# Patient Record
Sex: Female | Born: 1975 | Race: Black or African American | Hispanic: No | Marital: Married | Smoking: Never smoker
Health system: Southern US, Community
[De-identification: ages and names within clinical notes are randomized; demographics above are authoritative.]

## PROBLEM LIST (undated history)

## (undated) DIAGNOSIS — Z8719 Personal history of other diseases of the digestive system: Secondary | ICD-10-CM

## (undated) DIAGNOSIS — R002 Palpitations: Principal | ICD-10-CM

## (undated) DIAGNOSIS — K449 Diaphragmatic hernia without obstruction or gangrene: Secondary | ICD-10-CM

## (undated) DIAGNOSIS — K219 Gastro-esophageal reflux disease without esophagitis: Secondary | ICD-10-CM

## (undated) DIAGNOSIS — G8929 Other chronic pain: Secondary | ICD-10-CM

## (undated) DIAGNOSIS — F3342 Major depressive disorder, recurrent, in full remission: Secondary | ICD-10-CM

## (undated) DIAGNOSIS — E6609 Other obesity due to excess calories: Secondary | ICD-10-CM

## (undated) HISTORY — DX: Gastro-esophageal reflux disease without esophagitis: K21.9

## (undated) HISTORY — DX: Diaphragmatic hernia without obstruction or gangrene: K44.9

## (undated) HISTORY — DX: Palpitations: R00.2

---

## 2002-03-11 ENCOUNTER — Encounter: Payer: Self-pay | Admitting: Family Medicine

## 2002-03-11 ENCOUNTER — Ambulatory Visit (HOSPITAL_COMMUNITY): Admission: RE | Admit: 2002-03-11 | Discharge: 2002-03-11 | Payer: Self-pay | Admitting: Family Medicine

## 2004-11-22 ENCOUNTER — Emergency Department (HOSPITAL_COMMUNITY): Admission: EM | Admit: 2004-11-22 | Discharge: 2004-11-23 | Payer: Self-pay | Admitting: Emergency Medicine

## 2005-10-01 ENCOUNTER — Ambulatory Visit: Payer: Self-pay | Admitting: Gastroenterology

## 2005-10-14 ENCOUNTER — Ambulatory Visit: Payer: Self-pay | Admitting: Gastroenterology

## 2005-10-14 HISTORY — PX: ESOPHAGOGASTRODUODENOSCOPY: SHX1529

## 2007-04-02 LAB — CONVERTED CEMR LAB: Pap Smear: NORMAL

## 2007-04-28 ENCOUNTER — Ambulatory Visit: Payer: Self-pay | Admitting: Internal Medicine

## 2007-04-28 DIAGNOSIS — K219 Gastro-esophageal reflux disease without esophagitis: Secondary | ICD-10-CM | POA: Insufficient documentation

## 2007-04-30 ENCOUNTER — Ambulatory Visit: Payer: Self-pay | Admitting: Internal Medicine

## 2007-04-30 LAB — CONVERTED CEMR LAB
ALT: 14 units/L (ref 0–35)
AST: 18 units/L (ref 0–37)
Albumin: 3.9 g/dL (ref 3.5–5.2)
Alkaline Phosphatase: 60 units/L (ref 39–117)
BUN: 7 mg/dL (ref 6–23)
Basophils Absolute: 0.1 10*3/uL (ref 0.0–0.1)
Basophils Relative: 1.2 % — ABNORMAL HIGH (ref 0.0–1.0)
Bilirubin, Direct: 0.1 mg/dL (ref 0.0–0.3)
CO2: 29 meq/L (ref 19–32)
Calcium: 8.9 mg/dL (ref 8.4–10.5)
Chloride: 103 meq/L (ref 96–112)
Cholesterol: 157 mg/dL (ref 0–200)
Creatinine, Ser: 0.9 mg/dL (ref 0.4–1.2)
Eosinophils Absolute: 0.2 10*3/uL (ref 0.0–0.6)
Eosinophils Relative: 4 % (ref 0.0–5.0)
GFR calc Af Amer: 95 mL/min
GFR calc non Af Amer: 78 mL/min
Glucose, Bld: 101 mg/dL — ABNORMAL HIGH (ref 70–99)
HCT: 38.9 % (ref 36.0–46.0)
HDL: 48.2 mg/dL (ref >39.0)
Hemoglobin: 12.5 g/dL (ref 12.0–15.0)
LDL Cholesterol: 99 mg/dL (ref 0–99)
Lymphocytes Relative: 41 % (ref 12.0–46.0)
MCHC: 32 g/dL (ref 30.0–36.0)
MCV: 79.7 fL (ref 78.0–100.0)
Monocytes Absolute: 0.5 10*3/uL (ref 0.2–0.7)
Monocytes Relative: 8.6 % (ref 3.0–11.0)
Neutro Abs: 2.4 10*3/uL (ref 1.4–7.7)
Neutrophils Relative %: 45.2 % (ref 43.0–77.0)
Platelets: 249 10*3/uL (ref 150–400)
Potassium: 3.8 meq/L (ref 3.5–5.1)
RBC: 4.89 M/uL (ref 3.87–5.11)
RDW: 12.2 % (ref 11.5–14.6)
Sodium: 137 meq/L (ref 135–145)
TSH: 1.48 microintl units/mL (ref 0.35–5.50)
Total Bilirubin: 0.8 mg/dL (ref 0.3–1.2)
Total CHOL/HDL Ratio: 3.3
Total Protein: 6.9 g/dL (ref 6.0–8.3)
Triglycerides: 50 mg/dL (ref 0–149)
VLDL: 10 mg/dL (ref 0–40)
WBC: 5.5 10*3/uL (ref 4.5–10.5)

## 2008-08-30 LAB — CONVERTED CEMR LAB: Pap Smear: NORMAL

## 2009-02-17 ENCOUNTER — Ambulatory Visit: Payer: Self-pay | Admitting: Diagnostic Radiology

## 2009-02-17 ENCOUNTER — Ambulatory Visit: Payer: Self-pay | Admitting: Internal Medicine

## 2009-02-17 ENCOUNTER — Ambulatory Visit (HOSPITAL_BASED_OUTPATIENT_CLINIC_OR_DEPARTMENT_OTHER): Admission: RE | Admit: 2009-02-17 | Discharge: 2009-02-17 | Payer: Self-pay | Admitting: Internal Medicine

## 2009-02-17 ENCOUNTER — Encounter: Payer: Self-pay | Admitting: Family Medicine

## 2009-02-17 DIAGNOSIS — J22 Unspecified acute lower respiratory infection: Secondary | ICD-10-CM | POA: Insufficient documentation

## 2009-02-17 DIAGNOSIS — R051 Acute cough: Secondary | ICD-10-CM | POA: Insufficient documentation

## 2009-02-17 DIAGNOSIS — R05 Cough: Secondary | ICD-10-CM

## 2009-02-17 DIAGNOSIS — J069 Acute upper respiratory infection, unspecified: Secondary | ICD-10-CM | POA: Insufficient documentation

## 2010-03-06 NOTE — Letter (Signed)
Summary: Historic Patient File  Historic Patient File   Imported By: Rudene Anda 02/17/2009 09:18:33  _____________________________________________________________________  External Attachment:    Type:   Image     Comment:   External Document

## 2010-03-06 NOTE — Assessment & Plan Note (Signed)
Summary: Flu symptoms, fever102., - jr   Vital Signs:  Patient profile:   35 year old female Height:      64 inches Weight:      139 pounds BMI:     23.95 O2 Sat:      100 % on Room air Temp:     98.2 degrees F oral Pulse rate:   82 / minute Pulse rhythm:   regular Resp:     18 per minute BP sitting:   100 / 68  (right arm) Cuff size:   regular  Vitals Entered By: Glendell Docker CMA (February 17, 2009 10:23 AM)  O2 Flow:  Room air  Primary Care Provider:  D. Thomos Lemons DO  CC:  URI symptoms.  History of Present Illness:  URI Symptoms      This is a 35 year old woman who presents with URI symptoms.  The patient reports nasal congestion, purulent nasal discharge, dry cough, and productive cough, but denies sore throat, earache, and sick contacts.  Associated symptoms include fever of 100.5-103 degrees.  The patient denies dyspnea, wheezing, vomiting, and diarrhea.  The patient also reportsmuscle aches, and severe fatigue.    Preventive Screening-Counseling & Management  Alcohol-Tobacco     Smoking Status: never  Allergies: 1)  ! Amoxicillin  Past History:  Past Medical History: GERD EGD 10/14/2005 - Esophagitis   Family History: Mother age 41 -  hypertension Father age 94 - healthy She denies family history of colon cancer    Social History: Occupation: Psychologist, clinical for [employer] Married - 2 children Never Smoked Alcohol use-yes (seldom)    Physical Exam  General:  alert, well-developed, and well-nourished.   Ears:  R ear normal and L ear normal.   Mouth:  no exudates and pharyngeal erythema.   Neck:  supple and no masses.   Lungs:  normal respiratory effort, normal breath sounds, no crackles, and no wheezes.   Heart:  normal rate, regular rhythm, no murmur, and no gallop.   Abdomen:  soft and non-tender.   Extremities:  No lower extremity edema  Neurologic:  cranial nerves II-XII intact and gait normal.   Psych:  normally interactive,  good eye contact, not anxious appearing, and not depressed appearing.     Impression & Recommendations:  Problem # 1:  URI (ICD-465.9) 35 y/o fever, and URI symptoms.  we discussed possibility of influenza.  cxr negative.  symptomatic tx.  we discussed red flag symptoms.  Patient advised to call office if symptoms persist or worsen.  Other Orders: T-2 View CXR, Same Day (71020.5TC)  Patient Instructions: 1)  Drink plenty of fluids. 2)  Take over the counter tylenol or advil once or twice a day as needed for aches and pains.   Orders Added: 1)  T-2 View CXR, Same Day [71020.5TC] 2)  Est. Patient Level III [[address]]   Current Allergies (reviewed today): ! AMOXICILLIN     Preventive Care Screening  Pap Smear:    Date:  08/30/2008    Results:  normal

## 2010-03-06 NOTE — Letter (Signed)
Summary: Out of Work  Adult nurse at [employer]. [address]   [place], [place] [address]   Phone: [phone]  Fax: [phone]      February 17, 2009    Employee:  Terry Camacho    To Whom It May Concern:   For Medical reasons, please excuse the above named employee from work for the following dates:  Start:   02/17/2009  End:   02/21/2009    She may resume normal work schedule on 02/22/2009.If you need additional information, please feel free to contact our office.           Sincerely,    Glendell Docker CMA Dr Thomos Lemons

## 2010-06-06 ENCOUNTER — Encounter: Payer: Self-pay | Admitting: Internal Medicine

## 2010-06-07 ENCOUNTER — Ambulatory Visit (HOSPITAL_BASED_OUTPATIENT_CLINIC_OR_DEPARTMENT_OTHER)
Admission: RE | Admit: 2010-06-07 | Discharge: 2010-06-07 | Disposition: A | Discharge status: Home / Self Care | Payer: Managed Care, Other (non HMO) | Source: Ambulatory Visit | Attending: Internal Medicine | Admitting: Internal Medicine

## 2010-06-07 ENCOUNTER — Encounter: Payer: Self-pay | Admitting: Internal Medicine

## 2010-06-07 ENCOUNTER — Ambulatory Visit (INDEPENDENT_AMBULATORY_CARE_PROVIDER_SITE_OTHER): Payer: Managed Care, Other (non HMO) | Admitting: Internal Medicine

## 2010-06-07 DIAGNOSIS — R635 Abnormal weight gain: Secondary | ICD-10-CM

## 2010-06-07 DIAGNOSIS — R61 Generalized hyperhidrosis: Secondary | ICD-10-CM | POA: Insufficient documentation

## 2010-06-07 DIAGNOSIS — Z0289 Encounter for other administrative examinations: Secondary | ICD-10-CM

## 2010-06-07 LAB — CBC WITH DIFFERENTIAL/PLATELET
Basophils Relative: 1 % (ref 0–1)
Eosinophils Absolute: 0.1 10*3/uL (ref 0.0–0.7)
Eosinophils Relative: 2 % (ref 0–5)
Hemoglobin: 12.9 g/dL (ref 12.0–15.0)
Lymphs Abs: 2.5 10*3/uL (ref 0.7–4.0)
MCH: 25.9 pg — ABNORMAL LOW (ref 26.0–34.0)
MCHC: 32.6 g/dL (ref 30.0–36.0)
MCV: 79.5 fL (ref 78.0–100.0)
Monocytes Relative: 10 % (ref 3–12)
Neutrophils Relative %: 39 % — ABNORMAL LOW (ref 43–77)

## 2010-06-07 LAB — TSH: TSH: 0.952 u[IU]/mL (ref 0.350–4.500)

## 2010-06-07 NOTE — Patient Instructions (Signed)
Our office will contact you re:  Blood test and chest x ray results I suggest maintaining 1200-1300 cal diet.

## 2010-06-08 LAB — HEPATIC FUNCTION PANEL
ALT: 15 U/L (ref 0–35)
AST: 26 U/L (ref 0–37)
Albumin: 4.6 g/dL (ref 3.5–5.2)
Alkaline Phosphatase: 54 U/L (ref 39–117)
Total Bilirubin: 0.4 mg/dL (ref 0.3–1.2)
Total Protein: 7 g/dL (ref 6.0–8.3)

## 2010-06-22 NOTE — Letter (Signed)
October 01, 2005     Terry Camacho  [address]  [place], [place] [address]   MRN:  [id number]  /  DOB:  [birth year]   Dear Ms. [name];   It is my pleasure to have treated you recently as a new patient in my  office.  I appreciate your confidence and the opportunity to participate in  your care.   Since I do have a busy inpatient endoscopy schedule and office schedule, my  office hours vary weekly.  I am, however, available for emergency calls  every day through my office.  If I cannot promptly meet an urgent office  appointment, another one of our gastroenterologists will be able to assist  you.   My well-trained staff are prepared to help you at all times.  For  emergencies after office hours, a physician from our gastroenterology  section is always available through my 24-hour answering service.   While you are under my care, I encourage discussion of your questions and  concerns, and I will be happy to return your calls as soon as I am  available.   Once again, I welcome you as a new patient and I look forward to a happy and  healthy relationship.   Sincerely,     Barbette Hair. Arlyce Dice, MD, [facility]   RDK/MedQ  DD:  10/01/2005  DT:  10/02/2005  Job #:  [id number]

## 2010-06-22 NOTE — Assessment & Plan Note (Signed)
[facility]                           GASTROENTEROLOGY OFFICE NOTE   KYLEIGH, NANNINI                    MRN:          [id number]  DATE:10/01/2005                            DOB:          [birth year]    Ms. Camberos is a pleasant 35 year old African-American female, self-  referred for evaluation of pain.  She frequently develops postprandial  periumbilical and midepigastric pain, especially with spicy foods and fried  foods.  She has frequent pyrosis.  She has regurgitation of gastric contents  on occasion, and occasional hoarseness.  She took Prevacid on occasion with  only partial relief.  She is on gastric irritants including nonsteroidals.   PAST MEDICAL HISTORY:  Unremarkable.   FAMILY HISTORY:  Noncontributory.   MEDICATIONS:  She is currently on no medications.   ALLERGIES:  AMOXICILLIN.   SOCIAL HISTORY:  She does not smoke, she rarely drinks.  She is married, and  works in Clinical biochemist.   REVIEW OF SYSTEMS:  Was reviewed, and is negative.   PHYSICAL EXAMINATION:  VITAL SIGNS:  Pulse 80, blood pressure 120/78, weight  195.  HEENT: EOMI. PERRLA. Sclerae are anicteric.  Conjunctivae are pink.  NECK:  Supple without thyromegaly, adenopathy or carotid bruits.  CHEST:  Clear to auscultation and percussion without adventitious sounds.  CARDIAC:  Regular rhythm; normal S1 S2.  There are no murmurs, gallops or  rubs.  ABDOMEN:  Bowel sounds are normoactive.  Abdomen is soft, non-tender and non-  distended.  There are no abdominal masses, tenderness, splenic enlargement  or hepatomegaly.  EXTREMITIES:  Full range of motion.  No cyanosis, clubbing or edema.  RECTAL:  Deferred.   IMPRESSION:  1. Gastroesophageal reflux disease.  2. Abdominal pain.  This could be due to ulcer or nonulcerative dyspepsia.      Chronic cholecystitis is another concern.   RECOMMENDATION:  1. Begin Protonix 40 mg a day.  2. Upper endoscopy.  3.  If symptoms are not improved, and endoscopy is not diagnostic, I will      obtain an abdominal ultrasound.                                   Barbette Hair. Arlyce Dice, MD, [facility]   RDK/MedQ  DD:  10/01/2005  DT:  10/02/2005  Job #:  [id number]

## 2010-08-07 ENCOUNTER — Ambulatory Visit: Payer: Managed Care, Other (non HMO) | Admitting: Internal Medicine

## 2010-08-30 NOTE — Progress Notes (Signed)
  Subjective:    Patient ID: Terry Camacho, female    DOB: May 14, 1975, 35 y.o.   MRN: [id number]  HPI    Review of Systems     Objective:   Physical Exam        Assessment & Plan:

## 2010-10-15 ENCOUNTER — Encounter: Payer: Self-pay | Admitting: Gastroenterology

## 2010-10-18 ENCOUNTER — Ambulatory Visit (INDEPENDENT_AMBULATORY_CARE_PROVIDER_SITE_OTHER): Payer: Managed Care, Other (non HMO) | Admitting: Internal Medicine

## 2010-10-18 ENCOUNTER — Encounter: Payer: Self-pay | Admitting: Internal Medicine

## 2010-10-18 VITALS — BP 112/74 | HR 60 | Temp 98.2°F | Wt 141.0 lb

## 2010-10-18 DIAGNOSIS — B370 Candidal stomatitis: Secondary | ICD-10-CM

## 2010-10-18 DIAGNOSIS — R600 Localized edema: Secondary | ICD-10-CM

## 2010-10-18 DIAGNOSIS — R609 Edema, unspecified: Secondary | ICD-10-CM

## 2010-10-18 MED ORDER — FLUCONAZOLE 150 MG PO TABS: 150.0000 mg | ORAL_TABLET | Freq: Once | ORAL | Status: AC

## 2010-10-18 NOTE — Progress Notes (Signed)
  Subjective:    Patient ID: Terry Camacho, female    DOB: Feb 02, 1976, 35 y.o.   MRN: [id number]  HPI  35 y/o AA female complains of mild ankle edema over last 1-2 months.  Symptoms worse with prolonged standing.  No calf pain.   She follows fairly healthy diet but does eat sandwiches for lunch.  She works at [employer] and exercises regularly.  She drinks approx 1 gallon of water per day.  She denies hx of kidney disease.  No abnormal urination.  She also complains of white film on tongue since august.  She was treated with abx.  She has had recent screening for HIV - negative.  Review of Systems No chest pain or SOB  Past Medical History  Diagnosis Date  . GERD (gastroesophageal reflux disease)     History   Social History  . Marital Status: Married    Spouse Name: N/A    Number of Children: N/A  . Years of Education: N/A   Occupational History  . Not on file.   Social History Main Topics  . Smoking status: Never Smoker   . Smokeless tobacco: Not on file  . Alcohol Use: Not on file  . Drug Use: Not on file  . Sexually Active: Not on file   Other Topics Concern  . Not on file   Social History Narrative   Occupation: working at [employer] - 2 children (daugter 9 and 6)Never SmokedAlcohol use-yes (seldom)     Past Surgical History  Procedure Date  . Esophagogastroduodenoscopy 10/14/2005    esophagitis    Family History  Problem Relation Age of Onset  . Hypertension Mother 29  . Other Father 52    healty    Allergies  Allergen Reactions  . Amoxicillin     REACTION: Hives    No current outpatient prescriptions on file prior to visit.    BP 112/74  Pulse 60  Temp(Src) 98.2 F (36.8 C) (Oral)  Wt 141 lb (63.957 kg)       Objective:   Physical Exam   Constitutional: Appears well-developed and well-nourished. No distress.  HEENT:  White film on tongue Cardiovascular: Normal rate, regular rhythm and normal heart sounds.  Exam reveals no  gallop and no friction rub.   No murmur heard. Pulmonary/Chest: Effort normal and breath sounds normal.  No wheezes. No rales.  Abdominal: Soft. Bowel sounds are normal. No mass. There is no tenderness.  Lymph:  No cervical, axillary or groin adenopathy Ext:  Trace lower ext edema bilaterally.  No calf tenderness Skin: Skin is warm and dry.  Psychiatric: Normal mood and affect. Behavior is normal.     Assessment & Plan:

## 2010-10-18 NOTE — Assessment & Plan Note (Signed)
Oral thrush assoc with antibiotic use.  Use diflucan as directed.  Patient advised to call office if symptoms persist or worsen.

## 2010-10-18 NOTE — Patient Instructions (Signed)
Drink 6 to 8 glasses of water per day. Eating daily apple or serving of watermelon can act as natural diuretic Caffeine is also a natural diuretic Avoid foods high in salt Please call our office if your symptoms do not improve or gets worse.

## 2010-10-18 NOTE — Assessment & Plan Note (Signed)
Pt with mild lower ext edema with fluid retention.  Reassured pt.  Decrease fluid intake.  Eat foods that act as natural diuretic. Rule out proteinuria.  Check BMET

## 2010-10-19 LAB — BASIC METABOLIC PANEL
BUN: 18 mg/dL (ref 6–23)
Calcium: 8.9 mg/dL (ref 8.4–10.5)
GFR: 83.59 mL/min (ref >60.00)
Glucose, Bld: 93 mg/dL (ref 70–99)

## 2010-10-22 ENCOUNTER — Telehealth: Payer: Self-pay | Admitting: Internal Medicine

## 2010-10-22 MED ORDER — FLUCONAZOLE 150 MG PO TABS: 150.0000 mg | ORAL_TABLET | Freq: Once | ORAL | Status: AC

## 2010-10-22 NOTE — Telephone Encounter (Signed)
See office note.  rx was documented but I will send rx again.

## 2010-10-22 NOTE — Telephone Encounter (Signed)
Pt stated diflucan #7 pills  was not sent to [facility]. Pt seen on 9-14-`12

## 2010-10-22 NOTE — Telephone Encounter (Signed)
Pt aware.

## 2010-10-25 ENCOUNTER — Other Ambulatory Visit: Payer: Managed Care, Other (non HMO)

## 2010-11-08 ENCOUNTER — Ambulatory Visit (INDEPENDENT_AMBULATORY_CARE_PROVIDER_SITE_OTHER): Payer: Managed Care, Other (non HMO) | Admitting: Gastroenterology

## 2010-11-08 ENCOUNTER — Encounter: Payer: Self-pay | Admitting: Gastroenterology

## 2010-11-08 VITALS — BP 106/68 | HR 60 | Ht 62.5 in | Wt 144.6 lb

## 2010-11-08 DIAGNOSIS — R1013 Epigastric pain: Secondary | ICD-10-CM | POA: Insufficient documentation

## 2010-11-08 DIAGNOSIS — K3189 Other diseases of stomach and duodenum: Secondary | ICD-10-CM

## 2010-11-08 MED ORDER — OMEPRAZOLE 20 MG PO CPDR: 20.0000 mg | DELAYED_RELEASE_CAPSULE | ORAL | Status: DC

## 2010-11-08 NOTE — Progress Notes (Signed)
History of Present Illness:  Terry Camacho is a pleasant 35 year old Afro-American female referred at the request of Dr. Artist Pais for evaluation of abdominal discomfort. She complains of mild pain in the midepigastrium.   It occurs mainly in the mornings although can persist throughout the day. She denies pyrosis, per say. She had similar symptoms for which she was seen in 2007. Upper endoscopy demonstrated esophagitis. She's had no gastric irritants including nonsteroidals.    Review of Systems: Pertinent positive and negative review of systems were noted in the above HPI section. All other review of systems were otherwise negative.    Current Medications, Allergies, Past Medical History, Past Surgical History, Family History and Social History were reviewed in [facility] electronic medical record  Vital signs were reviewed in today's medical record. Physical Exam: General: Well developed , well nourished, no acute distress Head: Normocephalic and atraumatic Eyes:  sclerae anicteric, EOMI Ears: Normal auditory acuity Mouth: No deformity or lesions Lungs: Clear throughout to auscultation Heart: Regular rate and rhythm; no murmurs, rubs or bruits Abdomen: Soft,and non distended. No masses, hepatosplenomegaly or hernias noted. Normal Bowel sounds; there is minimal tenderness to palpation in the midepigastrium Rectal:deferred Musculoskeletal: Symmetrical with no gross deformities  Pulses:  Normal pulses noted Extremities: No clubbing, cyanosis, edema or deformities noted Neurological: Alert oriented x 4, grossly nonfocal Psychological:  Alert and cooperative. Normal mood and affect

## 2010-11-08 NOTE — Patient Instructions (Signed)
Please call our office back at [phone] if you are not feeling better in 2-3 weeks. CC: Dr Thomos Lemons

## 2010-11-08 NOTE — Assessment & Plan Note (Signed)
Symptoms are nonspecific and likely due to nonulcer dyspepsia. GERD may be contributing.  Medications #1 begin omeprazole 20 mg daily #2 if not improved in one to 2 weeks I would consider a trial of hyomax

## 2010-11-15 ENCOUNTER — Encounter: Payer: Self-pay | Admitting: Family Medicine

## 2010-11-15 ENCOUNTER — Ambulatory Visit (INDEPENDENT_AMBULATORY_CARE_PROVIDER_SITE_OTHER): Payer: Managed Care, Other (non HMO) | Admitting: Family Medicine

## 2010-11-15 VITALS — BP 90/60 | Temp 98.5°F | Wt 146.0 lb

## 2010-11-15 DIAGNOSIS — J209 Acute bronchitis, unspecified: Secondary | ICD-10-CM

## 2010-11-15 MED ORDER — HYDROCODONE-HOMATROPINE 5-1.5 MG/5ML PO SYRP: 5.0000 mL | ORAL_SOLUTION | Freq: Four times a day (QID) | ORAL | Status: AC | PRN

## 2010-11-15 NOTE — Patient Instructions (Signed)
Bronchitis Bronchitis is the body's way of reacting to injury and/or infection (inflammation) of the bronchi. Bronchi are the air tubes that extend from the windpipe into the lungs. If the inflammation becomes severe, it may cause shortness of breath.  CAUSES Inflammation may be caused by:  A virus.   Germs (bacteria).   Dust.   Allergens.   Pollutants and many other irritants.  The cells lining the bronchial tree are covered with tiny hairs (cilia). These constantly beat upward, away from the lungs, toward the mouth. This keeps the lungs free of pollutants. When these cells become too irritated and are unable to do their job, mucus begins to develop. This causes the characteristic cough of bronchitis. The cough clears the lungs when the cilia are unable to do their job. Without either of these protective mechanisms, the mucus would settle in the lungs. Then you would develop pneumonia. Smoking is a common cause of bronchitis and can contribute to pneumonia. Stopping this habit is the single most important thing you can do to help yourself. TREATMENT  Your caregiver may prescribe an antibiotic if the cough is caused by bacteria. Also, medicines that open up your airways make it easier to breathe. Your caregiver may also recommend or prescribe an expectorant. It will loosen the mucus to be coughed up. Only take over-the-counter or prescription medicines for pain, discomfort, or fever as directed by your caregiver.   Removing whatever causes the problem (smoking, for example) is critical to preventing the problem from getting worse.   Cough suppressants may be prescribed for relief of cough symptoms.   Inhaled medicines may be prescribed to help with symptoms now and to help prevent problems from returning.   For those with recurrent (chronic) bronchitis, there may be a need for steroid medicines.  SEEK IMMEDIATE MEDICAL CARE IF:  During treatment, you develop more pus-like mucus  (purulent sputum).   You or your child has an oral temperature above 102, not controlled by medicine.   Your baby is older than 3 months with a rectal temperature of 102 F (38.9 C) or higher.   Your baby is 3 months old or younger with a rectal temperature of 100.4 F (38 C) or higher.   You become progressively more ill.   You have increased difficulty breathing, wheezing, or shortness of breath.  It is necessary to seek immediate medical care if you are elderly or sick from any other disease. MAKE SURE YOU:  Understand these instructions.   Will watch your condition.   Will get help right away if you are not doing well or get worse.  Document Released: 01/21/2005 Document Re-Released: 04/17/2009 [employer] Patient Information 2011 [employer]. 

## 2010-11-15 NOTE — Progress Notes (Signed)
  Subjective:    Patient ID: Terry Camacho, female    DOB: 28-May-1975, 35 y.o.   MRN: [id number]  HPI Acute visit. 2 week history of nonproductive cough. Initially had some nasal congestion and cold-like symptoms. Cough is dry. No wheezing. No history of asthma. Nonsmoker. Patient denies fever. No cough or leaf with the Delsym cough syrup. Cough worse at night. No dyspnea or any pleuritic pain. No sick contacts.   Review of Systems  Constitutional: Negative for fever, chills, appetite change and unexpected weight change.  HENT: Negative for postnasal drip and sinus pressure.   Respiratory: Positive for cough. Negative for shortness of breath and wheezing.   Cardiovascular: Negative for chest pain.  Neurological: Negative for headaches.       Objective:   Physical Exam  Constitutional: She appears well-developed and well-nourished. No distress.  HENT:  Right Ear: External ear normal.  Left Ear: External ear normal.  Mouth/Throat: Oropharynx is clear and moist.  Neck: Neck supple.  Cardiovascular: Normal rate and regular rhythm.   Pulmonary/Chest: Effort normal and breath sounds normal. No respiratory distress. She has no wheezes. She has no rales.  Musculoskeletal: She exhibits no edema.  Lymphadenopathy:    She has no cervical adenopathy.          Assessment & Plan:  Cough probably related to acute viral bronchitis. Hycodan for nighttime suppression of cough. Followup as needed

## 2011-02-09 ENCOUNTER — Other Ambulatory Visit: Payer: Self-pay | Admitting: Gastroenterology

## 2011-02-25 ENCOUNTER — Ambulatory Visit (INDEPENDENT_AMBULATORY_CARE_PROVIDER_SITE_OTHER): Payer: Managed Care, Other (non HMO) | Admitting: Internal Medicine

## 2011-02-25 DIAGNOSIS — Z23 Encounter for immunization: Secondary | ICD-10-CM

## 2011-03-21 ENCOUNTER — Ambulatory Visit (INDEPENDENT_AMBULATORY_CARE_PROVIDER_SITE_OTHER): Payer: Managed Care, Other (non HMO) | Admitting: Family

## 2011-03-21 ENCOUNTER — Encounter: Payer: Self-pay | Admitting: Family

## 2011-03-21 DIAGNOSIS — J029 Acute pharyngitis, unspecified: Secondary | ICD-10-CM

## 2011-03-21 DIAGNOSIS — R0982 Postnasal drip: Secondary | ICD-10-CM

## 2011-03-21 MED ORDER — FEXOFENADINE-PSEUDOEPHED ER 180-240 MG PO TB24: 1.0000 | ORAL_TABLET | Freq: Every day | ORAL | Status: DC

## 2011-03-21 NOTE — Patient Instructions (Signed)

## 2011-03-21 NOTE — Progress Notes (Signed)
  Subjective:    Patient ID: Terry Camacho, female    DOB: 06/05/1975, 36 y.o.   MRN: [id number]  HPI A 36 year old [place] American female, nonsmoker, patient of Dr. Artist Pais in in with complaints of sore throat, drainage and hoarseness that occurs particularly at night x2 weeks. She has not taken any medication over-the-counter. She denies any fatigue, nausea, vomiting, muscle aches or pain.   Review of Systems  Constitutional: Negative.   HENT: Positive for sore throat and sinus pressure.   Respiratory: Negative.   Cardiovascular: Negative.   Musculoskeletal: Negative.   Skin: Negative.   Hematological: Negative.   Psychiatric/Behavioral: Negative.        Past Medical History  Diagnosis Date  . GERD (gastroesophageal reflux disease)   . Hiatal hernia     History   Social History  . Marital Status: Married    Spouse Name: N/A    Number of Children: 2  . Years of Education: N/A   Occupational History  . Customer Service    Social History Main Topics  . Smoking status: Never Smoker   . Smokeless tobacco: Never Used  . Alcohol Use: No     Rarely  . Drug Use: No  . Sexually Active: Not on file   Other Topics Concern  . Not on file   Social History Narrative   Occupation: working at [employer] - 2 children (daugter 9 and 6)Never SmokedAlcohol use-yes (seldom)     Past Surgical History  Procedure Date  . Esophagogastroduodenoscopy 10/14/2005    esophagitis    Family History  Problem Relation Age of Onset  . Hypertension Mother 45  . Other Father 77    healty  . Colon cancer Neg Hx   . Liver cancer Maternal Uncle   . Stomach cancer Maternal Aunt     Allergies  Allergen Reactions  . Amoxicillin     REACTION: Hives    Current Outpatient Prescriptions on File Prior to Visit  Medication Sig Dispense Refill  . omeprazole (PRILOSEC) 20 MG capsule TAKE 1 CAPSULE BY MOUTH DAILY  30 capsule  1    BP 120/80  Temp(Src) 98.6 F (37 C) (Oral)  Wt 148  lb (67.132 kg)charthe Objective:   Physical Exam  Constitutional: She is oriented to person, place, and time. She appears well-developed and well-nourished.  HENT:  Right Ear: External ear normal.  Left Ear: External ear normal.  Nose: Nose normal.  Mouth/Throat: Oropharynx is clear and moist.  Neck: Normal range of motion.  Cardiovascular: Normal rate, regular rhythm and normal heart sounds.   Pulmonary/Chest: Effort normal and breath sounds normal.  Neurological: She is alert and oriented to person, place, and time.  Skin: Skin is warm and dry.          Assessment & Plan:  Assessment: Pharyngitis, postnasal drip  Plan: Allegra-D 24-hour one by mouth daily. Call the office is symptoms worsen or persist. Check his schedule and when necessary.and

## 2011-08-20 ENCOUNTER — Ambulatory Visit (INDEPENDENT_AMBULATORY_CARE_PROVIDER_SITE_OTHER): Payer: Managed Care, Other (non HMO) | Admitting: Family

## 2011-08-20 ENCOUNTER — Encounter: Payer: Self-pay | Admitting: Family

## 2011-08-20 VITALS — BP 100/60 | Temp 99.0°F | Wt 148.0 lb

## 2011-08-20 DIAGNOSIS — K429 Umbilical hernia without obstruction or gangrene: Secondary | ICD-10-CM

## 2011-08-20 DIAGNOSIS — R52 Pain, unspecified: Secondary | ICD-10-CM

## 2011-08-20 DIAGNOSIS — R109 Unspecified abdominal pain: Secondary | ICD-10-CM

## 2011-08-20 DIAGNOSIS — K469 Unspecified abdominal hernia without obstruction or gangrene: Secondary | ICD-10-CM

## 2011-08-20 NOTE — Progress Notes (Signed)
  Subjective:    Patient ID: Terry Camacho, female    DOB: 10-19-1975, 36 y.o.   MRN: [id number]  HPI 36 year old [place] American female, nonsmoker, patient of Dr. Artist Pais is in today with complaints of abdominal pain over the past 5-6 months. She noticed the pain after lifting weights. She also reports a protrusion from her abdominal area. She is able to reduce it. The pain occurs about twice a week, lasting about an hour, rates it a 5/10. She has been continuing to weight lift. Reports lifting 80 pounds x12 repetitions, 3 sets daily.    Review of Systems  Constitutional: Negative.   Respiratory: Negative.   Cardiovascular: Negative.   Gastrointestinal: Positive for abdominal pain.       Bulge from the abdomen at the umbilicus.   Genitourinary: Negative.   Musculoskeletal: Negative.   Neurological: Negative.   Hematological: Negative.   Psychiatric/Behavioral: Negative.    Past Medical History  Diagnosis Date  . GERD (gastroesophageal reflux disease)   . Hiatal hernia     History   Social History  . Marital Status: Married    Spouse Name: N/A    Number of Children: 2  . Years of Education: N/A   Occupational History  . Customer Service    Social History Main Topics  . Smoking status: Never Smoker   . Smokeless tobacco: Never Used  . Alcohol Use: No     Rarely  . Drug Use: No  . Sexually Active: Not on file   Other Topics Concern  . Not on file   Social History Narrative   Occupation: working at [employer] - 2 children (daugter 9 and 6)Never SmokedAlcohol use-yes (seldom)     Past Surgical History  Procedure Date  . Esophagogastroduodenoscopy 10/14/2005    esophagitis    Family History  Problem Relation Age of Onset  . Hypertension Mother 15  . Other Father 59    healty  . Colon cancer Neg Hx   . Liver cancer Maternal Uncle   . Stomach cancer Maternal Aunt     Allergies  Allergen Reactions  . Amoxicillin     REACTION: Hives    Current  Outpatient Prescriptions on File Prior to Visit  Medication Sig Dispense Refill  . fexofenadine-pseudoephedrine (ALLEGRA-D 24) 180-240 MG per 24 hr tablet Take 1 tablet by mouth daily.  30 tablet  2  . omeprazole (PRILOSEC) 20 MG capsule TAKE 1 CAPSULE BY MOUTH DAILY  30 capsule  1    BP 100/60  Temp 99 F (37.2 C) (Oral)  Wt 148 lb (67.132 kg)chart    Objective:   Physical Exam  Constitutional: She is oriented to person, place, and time. She appears well-developed and well-nourished.  Neck: Normal range of motion. Neck supple.  Cardiovascular: Normal rate, regular rhythm and normal heart sounds.   Pulmonary/Chest: Effort normal and breath sounds normal.  Abdominal: Soft. Bowel sounds are normal.       Small protrusion from the abdomen at the site of the umbilicus that ir reducible. No strangulation noted.   Neurological: She is alert and oriented to person, place, and time.  Skin: Skin is warm and dry.  Psychiatric: She has a normal mood and affect.          Assessment & Plan:  Assessment: Hernia-umbilical, abdominal pain  Plan: Refer to surgeon. No weight lifting that could potentially put strain on the umbiblical area.

## 2011-08-20 NOTE — Patient Instructions (Addendum)
Hernia  A hernia occurs when an internal organ pushes out through a weak spot in the abdominal wall. Hernias most commonly occur in the groin and around the navel. Hernias often can be pushed back into place (reduced). Most hernias tend to get worse over time. Some abdominal hernias can get stuck in the opening (irreducible or incarcerated hernia) and cannot be reduced. An irreducible abdominal hernia which is tightly squeezed into the opening is at risk for impaired blood supply (strangulated hernia). A strangulated hernia is a medical emergency. Because of the risk for an irreducible or strangulated hernia, surgery may be recommended to repair a hernia.  CAUSES    Heavy lifting.   Prolonged coughing.   Straining to have a bowel movement.   A cut (incision) made during an abdominal surgery.  HOME CARE INSTRUCTIONS    Bed rest is not required. You may continue your normal activities.   Avoid lifting more than 10 pounds (4.5 kg) or straining.   Cough gently. If you are a smoker it is best to stop. Even the best hernia repair can break down with the continual strain of coughing. Even if you do not have your hernia repaired, a cough will continue to aggravate the problem.   Do not wear anything tight over your hernia. Do not try to keep it in with an outside bandage or truss. These can damage abdominal contents if they are trapped within the hernia sac.   Eat a normal diet.   Avoid constipation. Straining over long periods of time will increase hernia size and encourage breakdown of repairs. If you cannot do this with diet alone, stool softeners may be used.  SEEK IMMEDIATE MEDICAL CARE IF:    You have a fever.   You develop increasing abdominal pain.   You feel nauseous or vomit.   Your hernia is stuck outside the abdomen, looks discolored, feels hard, or is tender.   You have any changes in your bowel habits or in the hernia that are unusual for you.   You have increased pain or swelling around the  hernia.   You cannot push the hernia back in place by applying gentle pressure while lying down.  MAKE SURE YOU:    Understand these instructions.   Will watch your condition.   Will get help right away if you are not doing well or get worse.  Document Released: 01/21/2005 Document Revised: 01/10/2011 Document Reviewed: 09/10/2007  ExitCare Patient Information 2012 [employer].

## 2011-09-16 ENCOUNTER — Ambulatory Visit (INDEPENDENT_AMBULATORY_CARE_PROVIDER_SITE_OTHER): Payer: Managed Care, Other (non HMO) | Admitting: Surgery

## 2011-09-24 ENCOUNTER — Ambulatory Visit (INDEPENDENT_AMBULATORY_CARE_PROVIDER_SITE_OTHER): Payer: Managed Care, Other (non HMO) | Admitting: Surgery

## 2011-10-16 ENCOUNTER — Ambulatory Visit (INDEPENDENT_AMBULATORY_CARE_PROVIDER_SITE_OTHER): Payer: Managed Care, Other (non HMO) | Admitting: Surgery

## 2011-10-25 ENCOUNTER — Encounter (INDEPENDENT_AMBULATORY_CARE_PROVIDER_SITE_OTHER): Payer: Self-pay | Admitting: Surgery

## 2011-12-08 ENCOUNTER — Other Ambulatory Visit: Payer: Self-pay | Admitting: Gastroenterology

## 2011-12-10 ENCOUNTER — Other Ambulatory Visit (INDEPENDENT_AMBULATORY_CARE_PROVIDER_SITE_OTHER): Payer: Managed Care, Other (non HMO)

## 2011-12-10 DIAGNOSIS — Z0000 Encounter for general adult medical examination without abnormal findings: Secondary | ICD-10-CM

## 2011-12-10 LAB — CBC WITH DIFFERENTIAL/PLATELET
Basophils Absolute: 0 10*3/uL (ref 0.0–0.1)
Basophils Relative: 1.2 % (ref 0.0–3.0)
Eosinophils Absolute: 0.1 10*3/uL (ref 0.0–0.7)
MCHC: 32.2 g/dL (ref 30.0–36.0)
MCV: 82.5 fl (ref 78.0–100.0)
Monocytes Absolute: 0.4 10*3/uL (ref 0.1–1.0)
Neutro Abs: 1.5 10*3/uL (ref 1.4–7.7)
Neutrophils Relative %: 39.1 % — ABNORMAL LOW (ref 43.0–77.0)
RBC: 4.71 Mil/uL (ref 3.87–5.11)
RDW: 13.4 % (ref 11.5–14.6)

## 2011-12-10 LAB — BASIC METABOLIC PANEL
CO2: 24 mEq/L (ref 19–32)
Calcium: 8.2 mg/dL — ABNORMAL LOW (ref 8.4–10.5)
Chloride: 105 mEq/L (ref 96–112)
Creatinine, Ser: 1 mg/dL (ref 0.4–1.2)
Glucose, Bld: 87 mg/dL (ref 70–99)

## 2011-12-10 LAB — LIPID PANEL
Total CHOL/HDL Ratio: 2
Triglycerides: 40 mg/dL (ref 0.0–149.0)

## 2011-12-10 LAB — POCT URINALYSIS DIPSTICK
Bilirubin, UA: NEGATIVE
Glucose, UA: NEGATIVE
Ketones, UA: NEGATIVE
Leukocytes, UA: NEGATIVE
Nitrite, UA: NEGATIVE

## 2011-12-10 LAB — HEPATIC FUNCTION PANEL
AST: 23 U/L (ref 0–37)
Alkaline Phosphatase: 44 U/L (ref 39–117)
Bilirubin, Direct: 0.1 mg/dL (ref 0.0–0.3)

## 2011-12-17 ENCOUNTER — Encounter: Payer: Self-pay | Admitting: Internal Medicine

## 2011-12-17 ENCOUNTER — Ambulatory Visit (INDEPENDENT_AMBULATORY_CARE_PROVIDER_SITE_OTHER): Payer: Managed Care, Other (non HMO) | Admitting: Internal Medicine

## 2011-12-17 VITALS — BP 102/72 | HR 76 | Temp 98.3°F | Ht 62.0 in | Wt 153.0 lb

## 2011-12-17 DIAGNOSIS — Z23 Encounter for immunization: Secondary | ICD-10-CM

## 2011-12-17 DIAGNOSIS — Z0000 Encounter for general adult medical examination without abnormal findings: Secondary | ICD-10-CM | POA: Insufficient documentation

## 2011-12-17 DIAGNOSIS — K429 Umbilical hernia without obstruction or gangrene: Secondary | ICD-10-CM | POA: Insufficient documentation

## 2011-12-17 DIAGNOSIS — K219 Gastro-esophageal reflux disease without esophagitis: Secondary | ICD-10-CM

## 2011-12-17 NOTE — Patient Instructions (Addendum)
Take vitamin D3 2000 IU once daily Please return in 2 month for blood work: BMET, intact PTH, vitamin d level - hypocalcemia

## 2011-12-17 NOTE — Assessment & Plan Note (Addendum)
Patient has small umbilical hernia that is reducible. We discussed signs and symptoms of incarcerated hernia. She defers surgical referral for now.  Patient advised to use abdominal binder when she is lifting heavy objects.

## 2011-12-17 NOTE — Assessment & Plan Note (Signed)
Improved with omeprazole. Patient advised to transition to ranitidine 150 mg twice daily as needed.

## 2011-12-17 NOTE — Assessment & Plan Note (Addendum)
Reviewed adult health maintenance protocols.  Patient is up-to-date with adult vaccines. Patient advised to continue regular aerobic exercise. Screening lipid panel within acceptable limits.  PAP and pelvic performed by her GYN.

## 2011-12-17 NOTE — Progress Notes (Signed)
Subjective:    Patient ID: Terry Camacho, female    DOB: April 25, 1975, 36 y.o.   MRN: [id number]  HPI  36 year old [place] American female with history of GERD for routine physical. Interval medical history-patient was seen by nurse practitioner for umbilical hernia. Patient reports hernia is reducible. She has intermittent mild pains. She did not followup with surgeon as directed.  Screening labs were reviewed with patient in detail.  GERD-she is using omeprazole intermittently. Symptoms significantly improved since using PPI.  Review of Systems   Constitutional: Negative for activity change, appetite change. Mild weight gain Eyes: Negative for visual disturbance.  Respiratory: Negative for cough, chest tightness and shortness of breath.   Cardiovascular: Negative for chest pain.  Genitourinary: Negative for difficulty urinating.  Neurological: Negative for headaches.  Gastrointestinal: Negative for abdominal pain, heartburn melena or hematochezia Psych: Negative for depression or anxiety  Past Medical History  Diagnosis Date  . GERD (gastroesophageal reflux disease)   . Hiatal hernia     History   Social History  . Marital Status: Married    Spouse Name: N/A    Number of Children: 2  . Years of Education: N/A   Occupational History  . Customer Service    Social History Main Topics  . Smoking status: Never Smoker   . Smokeless tobacco: Never Used  . Alcohol Use: No     Comment: Rarely  . Drug Use: No  . Sexually Active: Not on file   Other Topics Concern  . Not on file   Social History Narrative   Occupation: working at [employer] - 2 children (daugter 9 and 6)Never SmokedAlcohol use-yes (seldom)     Past Surgical History  Procedure Date  . Esophagogastroduodenoscopy 10/14/2005    esophagitis    Family History  Problem Relation Age of Onset  . Hypertension Mother 65  . Other Father 47    healty  . Colon cancer Neg Hx   . Liver cancer Maternal  Uncle   . Stomach cancer Maternal Aunt     Allergies  Allergen Reactions  . Amoxicillin     REACTION: Hives    Current Outpatient Prescriptions on File Prior to Visit  Medication Sig Dispense Refill  . omeprazole (PRILOSEC) 20 MG capsule TAKE 1 CAPSULE BY MOUTH DAILY  30 capsule  1    BP 102/72  Pulse 76  Temp 98.3 F (36.8 C) (Oral)  Ht 5\' 2"  (1.575 m)  Wt 153 lb (69.4 kg)  BMI 27.98 kg/m2        Objective:   Physical Exam  Constitutional: She is oriented to person, place, and time. She appears well-developed and well-nourished.  HENT:  Head: Normocephalic and atraumatic.  Right Ear: External ear normal.  Left Ear: External ear normal.  Mouth/Throat: Oropharynx is clear and moist.  Eyes: EOM are normal. Pupils are equal, round, and reactive to light.  Neck: Neck supple.  Cardiovascular: Normal rate and regular rhythm.   Pulmonary/Chest: Effort normal and breath sounds normal. She has no wheezes.  Abdominal: Soft. Bowel sounds are normal. She exhibits no mass.       Small umbilical hernia, reducible  Musculoskeletal:       Trace lower extremity edema  Lymphadenopathy:    She has no cervical adenopathy.  Neurological: She is alert and oriented to person, place, and time. No cranial nerve deficit.  Skin: Skin is warm and dry.  Psychiatric: She has a normal mood and affect. Her behavior is normal.  Assessment & Plan:

## 2012-02-10 ENCOUNTER — Telehealth: Payer: Self-pay | Admitting: Internal Medicine

## 2012-02-10 NOTE — Telephone Encounter (Signed)
Patient Information:  Caller Name: Shanise  Phone: [phone]  Patient: Dlisa, Barnwell  Gender: Female  DOB: December 22, 1975  Age: 37 Years  PCP: Artist Pais Doe-Hyun Molly Maduro) (Adults only)  Pregnant: No  Office Follow Up:  Does the office need to follow up with this patient?: Yes  Instructions For The Office: Yes please call to schedule appt. for within 72 hours.  RN Note:  Needs to be seen in 72 hours.  Please call to schedule. Denies any exposure to fumes or gases prior to the hoarseness.  Started occuring in August 2013 x1, and then monthly and sometimes several times a month.  Will just wake up with the hoarseness and it will last for about 7 days and then go away.  No sore throat or pain or fever.  Symptoms  Reason For Call & Symptoms: Having repeated episodes of hoarseness with no other sx.  Reviewed Health History In EMR: Yes  Reviewed Medications In EMR: Yes  Reviewed Allergies In EMR: Yes  Reviewed Surgeries / Procedures: Yes  Date of Onset of Symptoms: 02/07/2012 OB / GYN:  LMP: Unknown  Guideline(s) Used:  No Protocol Available - Sick Adult  Disposition Per Guideline:   See Within 3 Days in Office  Reason For Disposition Reached:   Nursing judgment  Advice Given:  N/A

## 2012-02-10 NOTE — Telephone Encounter (Signed)
Left message for pt to call back to schedule appt

## 2012-04-08 ENCOUNTER — Ambulatory Visit (INDEPENDENT_AMBULATORY_CARE_PROVIDER_SITE_OTHER): Payer: Managed Care, Other (non HMO) | Admitting: Internal Medicine

## 2012-04-08 ENCOUNTER — Encounter: Payer: Self-pay | Admitting: Internal Medicine

## 2012-04-08 VITALS — BP 96/70 | HR 64 | Temp 98.2°F | Wt 154.0 lb

## 2012-04-08 DIAGNOSIS — IMO0001 Reserved for inherently not codable concepts without codable children: Secondary | ICD-10-CM

## 2012-04-08 MED ORDER — METHOCARBAMOL 500 MG PO TABS: 500.0000 mg | ORAL_TABLET | Freq: Two times a day (BID) | ORAL | Status: DC | PRN

## 2012-04-08 NOTE — Assessment & Plan Note (Signed)
37 year old African American female complains of post exertional muscle aches predominantly in her lower extremities.  Rule out polymyositis. Obtain CPK, aldolase, ANA, sedimentation rate.  Trial of muscle relaxers. If she has evidence of myositis, we discussed referral to rheumatology.

## 2012-04-08 NOTE — Progress Notes (Signed)
  Subjective:    Patient ID: Terry Camacho, female    DOB: 1975-03-29, 37 y.o.   MRN: [id number]  HPI  37 year old African American female complains of mild lower extremity edema. Her symptoms seem to be worse after prolonged standing. Patient also complains of post exertional muscle aches. She exercises on a fairly regular basis. After performing lower extremity exercises, she experiences muscle aches and pains for 2-3 days. She also has mild post exertional weakness.  Review of Systems Negative for rash  Past Medical History  Diagnosis Date  . GERD (gastroesophageal reflux disease)   . Hiatal hernia     History   Social History  . Marital Status: Married    Spouse Name: N/A    Number of Children: 2  . Years of Education: N/A   Occupational History  . Customer Service    Social History Main Topics  . Smoking status: Never Smoker   . Smokeless tobacco: Never Used  . Alcohol Use: No     Comment: Rarely  . Drug Use: No  . Sexually Active: Not on file   Other Topics Concern  . Not on file   Social History Narrative   Occupation: working at [employer]   Married - 2 children (daugter 9 and 6)   Never Smoked   Alcohol use-yes (seldom)        Past Surgical History  Procedure Laterality Date  . Esophagogastroduodenoscopy  10/14/2005    esophagitis    Family History  Problem Relation Age of Onset  . Hypertension Mother 2  . Other Father 60    healty  . Colon cancer Neg Hx   . Liver cancer Maternal Uncle   . Stomach cancer Maternal Aunt     Allergies  Allergen Reactions  . Amoxicillin     REACTION: Hives    No current outpatient prescriptions on file prior to visit.   No current facility-administered medications on file prior to visit.    BP 96/70  Pulse 64  Temp(Src) 98.2 F (36.8 C) (Oral)  Wt 154 lb (69.854 kg)  BMI 28.16 kg/m2       Objective:   Physical Exam  Constitutional: She appears well-developed and well-nourished.   Cardiovascular: Normal rate, regular rhythm, normal heart sounds and intact distal pulses.   Pulmonary/Chest: Effort normal and breath sounds normal. She has no wheezes.  Musculoskeletal: She exhibits no tenderness.  Trace lower extremity edema bilaterally          Assessment & Plan:

## 2012-05-11 ENCOUNTER — Encounter: Payer: Self-pay | Admitting: Internal Medicine

## 2012-05-11 ENCOUNTER — Ambulatory Visit: Payer: Managed Care, Other (non HMO)

## 2012-05-11 ENCOUNTER — Ambulatory Visit (INDEPENDENT_AMBULATORY_CARE_PROVIDER_SITE_OTHER): Payer: Managed Care, Other (non HMO) | Admitting: Internal Medicine

## 2012-05-11 VITALS — BP 102/74 | HR 64 | Temp 98.0°F | Wt 158.0 lb

## 2012-05-11 DIAGNOSIS — IMO0001 Reserved for inherently not codable concepts without codable children: Secondary | ICD-10-CM

## 2012-05-11 LAB — CBC WITH DIFFERENTIAL/PLATELET
Basophils Absolute: 0.1 10*3/uL (ref 0.0–0.1)
Eosinophils Absolute: 0.1 10*3/uL (ref 0.0–0.7)
Lymphocytes Relative: 34.5 % (ref 12.0–46.0)
MCHC: 33 g/dL (ref 30.0–36.0)
MCV: 80.3 fl (ref 78.0–100.0)
Monocytes Absolute: 0.5 10*3/uL (ref 0.1–1.0)
Neutro Abs: 3.1 10*3/uL (ref 1.4–7.7)
Neutrophils Relative %: 53.8 % (ref 43.0–77.0)
RDW: 13 % (ref 11.5–14.6)

## 2012-05-11 LAB — BASIC METABOLIC PANEL
CO2: 25 mEq/L (ref 19–32)
Calcium: 8.7 mg/dL (ref 8.4–10.5)
Chloride: 107 mEq/L (ref 96–112)
Creatinine, Ser: 1.2 mg/dL (ref 0.4–1.2)
Glucose, Bld: 92 mg/dL (ref 70–99)

## 2012-05-11 LAB — T4, FREE: Free T4: 0.8 ng/dL (ref 0.60–1.60)

## 2012-05-11 MED ORDER — METHOCARBAMOL 500 MG PO TABS: 500.0000 mg | ORAL_TABLET | Freq: Two times a day (BID) | ORAL | Status: DC | PRN

## 2012-05-11 NOTE — Patient Instructions (Addendum)
Our office will contact you re: blood test results 

## 2012-05-11 NOTE — Progress Notes (Signed)
  Subjective:    Patient ID: Terry Camacho, female    DOB: 06-26-1975, 37 y.o.   MRN: [id number]  HPI  37 year old African American female previously seen for post exertional muscle aches and weakness for followup. Overall her symptoms have now been going on for 3-4 months. Her symptoms did not improve with rest. She restarted her exercise program and is experiencing recurrence of muscle burning sensation and weakness. It takes an usually long time for her muscles to recover.  Prevous she experienced leg weakness and pain.  She reports similar symptoms after upper extremity work outs.  Patient denies any preceding flulike illness before onset of symptoms.  Patient did not have blood work as directed. She has not tried Robaxin.  Review of Systems See HPI  Past Medical History  Diagnosis Date  . GERD (gastroesophageal reflux disease)   . Hiatal hernia     History   Social History  . Marital Status: Married    Spouse Name: N/A    Number of Children: 2  . Years of Education: N/A   Occupational History  . Customer Service    Social History Main Topics  . Smoking status: Never Smoker   . Smokeless tobacco: Never Used  . Alcohol Use: No     Comment: Rarely  . Drug Use: No  . Sexually Active: Not on file   Other Topics Concern  . Not on file   Social History Narrative   Occupation: working at [employer]   Married - 2 children (daugter 9 and 6)   Never Smoked   Alcohol use-yes (seldom)        Past Surgical History  Procedure Laterality Date  . Esophagogastroduodenoscopy  10/14/2005    esophagitis    Family History  Problem Relation Age of Onset  . Hypertension Mother 4  . Other Father 8    healty  . Colon cancer Neg Hx   . Liver cancer Maternal Uncle   . Stomach cancer Maternal Aunt     Allergies  Allergen Reactions  . Amoxicillin     REACTION: Hives    No current outpatient prescriptions on file prior to visit.   No current facility-administered  medications on file prior to visit.    BP 102/74  Pulse 64  Temp(Src) 98 F (36.7 C) (Oral)  Wt 158 lb (71.668 kg)  BMI 28.89 kg/m2       Objective:   Physical Exam  Constitutional: She appears well-developed and well-nourished.  Cardiovascular: Regular rhythm and normal heart sounds.   No murmur heard. Pulmonary/Chest: Effort normal and breath sounds normal. She has no wheezes.  Musculoskeletal:  Muscle strength is 5 out of 5 in upper and lower extremities  Neurological: She displays normal reflexes.          Assessment & Plan:

## 2012-05-11 NOTE — Assessment & Plan Note (Signed)
No improvement in post exertional muscle pain and weakness after exercise. Her symptoms ongoing for 3-4 months. Proceed with blood work.  Consider rheumatology referral.

## 2012-05-12 ENCOUNTER — Other Ambulatory Visit: Payer: Self-pay | Admitting: Internal Medicine

## 2012-05-12 DIAGNOSIS — R748 Abnormal levels of other serum enzymes: Secondary | ICD-10-CM

## 2012-05-14 LAB — ALDOLASE: Aldolase: 33.7 U/L — ABNORMAL HIGH (ref <=8.1)

## 2012-06-02 ENCOUNTER — Ambulatory Visit (INDEPENDENT_AMBULATORY_CARE_PROVIDER_SITE_OTHER): Payer: Managed Care, Other (non HMO) | Admitting: Neurology

## 2012-06-02 ENCOUNTER — Ambulatory Visit (INDEPENDENT_AMBULATORY_CARE_PROVIDER_SITE_OTHER): Payer: Managed Care, Other (non HMO)

## 2012-06-02 DIAGNOSIS — M6281 Muscle weakness (generalized): Secondary | ICD-10-CM

## 2012-06-02 DIAGNOSIS — R29898 Other symptoms and signs involving the musculoskeletal system: Secondary | ICD-10-CM

## 2012-06-02 DIAGNOSIS — Z0289 Encounter for other administrative examinations: Secondary | ICD-10-CM

## 2012-06-02 NOTE — Procedures (Signed)
  HISTORY:  Terry Camacho is a 37 year old patient with a one-year history of intermittent episodes of muscle pain and slight muscle swelling that occur after periods of exercise. The symptoms have significantly worsened over the last several months. The patient has had documented episodes of CK enzyme elevations over 2000. The patient is being evaluated for a possible myopathy. The patient denies any weakness of the extremities, and she denies any exercise intolerance or pain in the muscles during exercise. The patient denies any muscle cramps.  NERVE CONDUCTION STUDIES:  Nerve conduction studies were performed on both lower extremities. The distal motor latencies and motor amplitudes for the peroneal and posterior tibial nerves were within normal limits. The nerve conduction velocities for these nerves were also normal. The H reflex latencies normal. The sensory latencies for the peroneal nerves were within normal limits.    EMG STUDIES:  EMG study was performed on the right lower extremity:  The tibialis anterior muscle reveals 1 to 3K motor units with full recruitment. No fibrillations or positive waves were seen. The peroneus tertius muscle reveals 1 to 3K motor units with full recruitment. No fibrillations or positive waves were seen. The medial gastrocnemius muscle reveals 1 to 2K motor units with full recruitment. No fibrillations or positive waves were seen. The vastus lateralis muscle reveals 2 to 4K motor units with full recruitment. No fibrillations or positive waves were seen. The iliopsoas muscle reveals 2 to 4K motor units with full recruitment. No fibrillations or positive waves were seen. The biceps femoris muscle (long head) reveals 2 to 4K motor units with full recruitment. No fibrillations or positive waves were seen. The lumbosacral paraspinal muscles were tested at 3 levels, and revealed no abnormalities of insertional activity at all 3 levels tested. There was good  relaxation.  EMG study was performed on the left lower extremity:  The tibialis anterior muscle reveals 1 to 3K motor units with full recruitment. No fibrillations or positive waves were seen. The peroneus tertius muscle reveals 1 to 3K motor units with full recruitment. No fibrillations or positive waves were seen. The medial gastrocnemius muscle reveals 1 to 3K motor units with full recruitment. No fibrillations or positive waves were seen. The vastus lateralis muscle reveals 2 to 4K motor units with full recruitment. No fibrillations or positive waves were seen. The iliopsoas muscle reveals 2 to 4K motor units with full recruitment. No fibrillations or positive waves were seen. The biceps femoris muscle (long head) reveals 2 to 4K motor units with full recruitment. No fibrillations or positive waves were seen. The lumbosacral paraspinal muscles were tested at 3 levels, and revealed no abnormalities of insertional activity at all 3 levels tested. There was good relaxation.    IMPRESSION:  Nerve conduction studies done on both lower extremities were unremarkable, without evidence of a peripheral neuropathy. EMG evaluation of both lower extremities were normal, without evidence of myopathic motor units. This study would not excluded various etiologies of rhabdomyolysis. There is no evidence of an inflammatory myositis on this evaluation.  Marlan Palau MD 06/02/2012 2:17 PM  [facility] [address] [place], [place] [address]  Phone [phone] Fax [phone]

## 2012-08-25 ENCOUNTER — Ambulatory Visit (INDEPENDENT_AMBULATORY_CARE_PROVIDER_SITE_OTHER): Payer: Managed Care, Other (non HMO) | Admitting: Family Medicine

## 2012-08-25 ENCOUNTER — Encounter: Payer: Self-pay | Admitting: Family Medicine

## 2012-08-25 VITALS — BP 110/72 | Temp 98.3°F | Wt 157.0 lb

## 2012-08-25 DIAGNOSIS — H6120 Impacted cerumen, unspecified ear: Secondary | ICD-10-CM

## 2012-08-25 DIAGNOSIS — J309 Allergic rhinitis, unspecified: Secondary | ICD-10-CM

## 2012-08-25 DIAGNOSIS — H6121 Impacted cerumen, right ear: Secondary | ICD-10-CM

## 2012-08-25 MED ORDER — FLUTICASONE PROPIONATE 50 MCG/ACT NA SUSP: 2.0000 | Freq: Every day | NASAL | Status: DC

## 2012-08-25 NOTE — Progress Notes (Signed)
Chief Complaint  Patient presents with  . Otalgia    hearing muffled x 1 month; pain x 1 week     HPI:  Acute visit for:  1) R Ear Pain: -started: 1 week ago, pain is mildo -symptoms: pain in R ear, noticed some popping/muffled sensation about 1 month ago that resolved when moved her ear around, nasal congestion, sneezing, sinus pressure, feels like has a cold, itchy throat at times -denies: fever, NVD, tooth pain, hearing loss -uses q-tips in ears to try to clean out   ROS: See pertinent positives and negatives per HPI.  Past Medical History  Diagnosis Date  . GERD (gastroesophageal reflux disease)   . Hiatal hernia     Family History  Problem Relation Age of Onset  . Hypertension Mother 40  . Other Father 80    healty  . Colon cancer Neg Hx   . Liver cancer Maternal Uncle   . Stomach cancer Maternal Aunt     History   Social History  . Marital Status: Married    Spouse Name: N/A    Number of Children: 2  . Years of Education: N/A   Occupational History  . Customer Service    Social History Main Topics  . Smoking status: Never Smoker   . Smokeless tobacco: Never Used  . Alcohol Use: No     Comment: Rarely  . Drug Use: No  . Sexually Active: None   Other Topics Concern  . None   Social History Narrative   Occupation: working at [employer]   Married - 2 children (daugter 9 and 6)   Never Smoked   Alcohol use-yes (seldom)        Current outpatient prescriptions:fluticasone (FLONASE) 50 MCG/ACT nasal spray, Place 2 sprays into the nose daily., Disp: 16 g, Rfl: 2;  methocarbamol (ROBAXIN) 500 MG tablet, Take 1 tablet (500 mg total) by mouth 2 (two) times daily as needed., Disp: 30 tablet, Rfl: 1  EXAM:  Filed Vitals:   08/25/12 0759  BP: 110/72  Temp: 98.3 F (36.8 C)    Body mass index is 28.71 kg/(m^2).  GENERAL: vitals reviewed and listed above, alert, oriented, appears well hydrated and in no acute distress  HEENT: atraumatic, conjunttiva  clear, no obvious abnormalities on inspection of external nose and ears,  Chief Complaint  Patient presents with  . Otalgia    hearing muffled x 1 month; pain x 1 week     HPI:  ROS: See pertinent positives and negatives per HPI.  Past Medical History  Diagnosis Date  . GERD (gastroesophageal reflux disease)   . Hiatal hernia     Family History  Problem Relation Age of Onset  . Hypertension Mother 68  . Other Father 39    healty  . Colon cancer Neg Hx   . Liver cancer Maternal Uncle   . Stomach cancer Maternal Aunt     History   Social History  . Marital Status: Married    Spouse Name: N/A    Number of Children: 2  . Years of Education: N/A   Occupational History  . Customer Service    Social History Main Topics  . Smoking status: Never Smoker   . Smokeless tobacco: Never Used  . Alcohol Use: No     Comment: Rarely  . Drug Use: No  . Sexually Active: None   Other Topics Concern  . 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  GENERAL: vitals reviewed and listed above, alert, oriented, appears well hydrated and in no acute distress, normal appearance of ear canals and TMs ecept cerumen impaction on R and a little dried blood in canal, clear nasal congestion with very boggy turbinates, mild post oropharyngeal erythema with PND, no tonsillar edema or exudate, no sinus TTP  HEENT: atraumatic, conjunttiva clear, no obvious abnormalities on inspection of external nose  and ears  NECK: no obvious masses on inspection  LUNGS: clear to auscultation bilaterally, no wheezes, rales or rhonchi, good air movement  CV: HRRR, no peripheral edema  MS: moves all extremities without noticeable abnormality  PSYCH: pleasant and cooperative, no obvious depression or anxiety  ASSESSMENT AND PLAN:  Discussed the following assessment and plan:  Allergic rhinitis -flonase - use and risks discussed -may be cause ing some eustachian tube dysfunction  Cerumen impaction, right -advised she see ENT for removal as ear canals small and curved and with dried blood in canal do not wish to remove without proper equiptment - no signs of otitis external - number provided for pt to call ENT  -Patient advised to return or notify a doctor immediately if symptoms worsen or persist or new concerns arise.  There are no Patient Instructions on file for this visit.   Kriste Basque R.

## 2012-08-25 NOTE — Patient Instructions (Addendum)
Start flonase 2 sprays each nostril daily for 1 month then 1 spray each nostril  Call ear nose and throat doctor to remove ear wax and evaluate your ear pain with brochure provided

## 2013-10-06 ENCOUNTER — Telehealth: Payer: Self-pay | Admitting: Internal Medicine

## 2013-10-06 NOTE — Telephone Encounter (Signed)
I read OV from Dr. Shawnee Knapp PA but no follow up.  Looks like she was referred to Dr. Anne Hahn for NCS/EMG.  Was this ever completed.  Overall, I suggest pt schedule follow up OV.  We can determine which tests are appropriate.

## 2013-10-06 NOTE — Telephone Encounter (Signed)
Before we schedule labs.  At last OV with me, pt found to have significant elevation in CK.  Did pt follow up with Dr. Dareen Piano - rheumatologist as directed.  If yes, do we have copy of notes.

## 2013-10-06 NOTE — Telephone Encounter (Signed)
Left message on machine for patient to return our call 

## 2013-10-06 NOTE — Telephone Encounter (Signed)
Pt states she ws to have iron checked a while back, but did not. Now pt is feeling tired again and would like to have iron test. Ok to sch?

## 2013-10-15 NOTE — Telephone Encounter (Signed)
Left message for pt to call back  °

## 2013-10-15 NOTE — Telephone Encounter (Signed)
She got the NCS/EMG last year.  [facility].  She has an appt with Dr Artist Pais on 10/25/13 but she states she may go to UC if her symptoms don't improve.  If she feels she does not keep appt with Dr Artist Pais she will call back and cancel

## 2013-10-18 ENCOUNTER — Ambulatory Visit: Payer: Managed Care, Other (non HMO) | Admitting: Internal Medicine

## 2013-10-25 ENCOUNTER — Ambulatory Visit: Payer: Managed Care, Other (non HMO) | Admitting: Physician Assistant

## 2013-10-25 ENCOUNTER — Ambulatory Visit: Payer: Managed Care, Other (non HMO) | Admitting: Internal Medicine

## 2013-10-28 ENCOUNTER — Ambulatory Visit: Payer: Managed Care, Other (non HMO) | Admitting: Physician Assistant

## 2013-11-04 ENCOUNTER — Ambulatory Visit: Payer: Managed Care, Other (non HMO) | Admitting: Physician Assistant

## 2013-11-04 DIAGNOSIS — Z0289 Encounter for other administrative examinations: Secondary | ICD-10-CM

## 2014-01-14 ENCOUNTER — Encounter: Payer: Self-pay | Admitting: Internal Medicine

## 2014-09-22 ENCOUNTER — Other Ambulatory Visit (INDEPENDENT_AMBULATORY_CARE_PROVIDER_SITE_OTHER): Payer: Managed Care, Other (non HMO)

## 2014-09-22 DIAGNOSIS — Z0000 Encounter for general adult medical examination without abnormal findings: Secondary | ICD-10-CM

## 2014-09-22 LAB — CBC WITH DIFFERENTIAL/PLATELET
BASOS PCT: 0.7 % (ref 0.0–3.0)
Basophils Absolute: 0 10*3/uL (ref 0.0–0.1)
EOS ABS: 0 10*3/uL (ref 0.0–0.7)
EOS PCT: 0.9 % (ref 0.0–5.0)
HEMATOCRIT: 39.4 % (ref 36.0–46.0)
HEMOGLOBIN: 13 g/dL (ref 12.0–15.0)
LYMPHS PCT: 33.2 % (ref 12.0–46.0)
Lymphs Abs: 1.5 10*3/uL (ref 0.7–4.0)
MCHC: 32.9 g/dL (ref 30.0–36.0)
MCV: 80.6 fl (ref 78.0–100.0)
Monocytes Absolute: 0.4 10*3/uL (ref 0.1–1.0)
Monocytes Relative: 10.2 % (ref 3.0–12.0)
Neutro Abs: 2.4 10*3/uL (ref 1.4–7.7)
Neutrophils Relative %: 55 % (ref 43.0–77.0)
Platelets: 195 10*3/uL (ref 150.0–400.0)
RBC: 4.89 Mil/uL (ref 3.87–5.11)
RDW: 13 % (ref 11.5–15.5)
WBC: 4.4 10*3/uL (ref 4.0–10.5)

## 2014-09-22 LAB — LIPID PANEL
CHOL/HDL RATIO: 3
Cholesterol: 171 mg/dL (ref 0–200)
HDL: 60.7 mg/dL (ref >39.00)
LDL Cholesterol: 104 mg/dL — ABNORMAL HIGH (ref 0–99)
NONHDL: 110.43
Triglycerides: 31 mg/dL (ref 0.0–149.0)
VLDL: 6.2 mg/dL (ref 0.0–40.0)

## 2014-09-22 LAB — BASIC METABOLIC PANEL
BUN: 19 mg/dL (ref 6–23)
CHLORIDE: 105 meq/L (ref 96–112)
CO2: 29 mEq/L (ref 19–32)
Calcium: 9 mg/dL (ref 8.4–10.5)
Creatinine, Ser: 1.08 mg/dL (ref 0.40–1.20)
GFR: 73.08 mL/min (ref >60.00)
Glucose, Bld: 95 mg/dL (ref 70–99)
POTASSIUM: 3.8 meq/L (ref 3.5–5.1)
Sodium: 139 mEq/L (ref 135–145)

## 2014-09-22 LAB — POCT URINALYSIS DIPSTICK
BILIRUBIN UA: NEGATIVE
Glucose, UA: NEGATIVE
KETONES UA: NEGATIVE
Leukocytes, UA: NEGATIVE
NITRITE UA: NEGATIVE
PH UA: 7
Protein, UA: NEGATIVE
RBC UA: NEGATIVE
Spec Grav, UA: 1.02
Urobilinogen, UA: 0.2

## 2014-09-22 LAB — HEPATIC FUNCTION PANEL
ALBUMIN: 4.3 g/dL (ref 3.5–5.2)
ALT: 11 U/L (ref 0–35)
AST: 17 U/L (ref 0–37)
Alkaline Phosphatase: 57 U/L (ref 39–117)
Bilirubin, Direct: 0.1 mg/dL (ref 0.0–0.3)
Total Bilirubin: 0.6 mg/dL (ref 0.2–1.2)
Total Protein: 6.9 g/dL (ref 6.0–8.3)

## 2014-09-22 LAB — TSH: TSH: 1.44 u[IU]/mL (ref 0.35–4.50)

## 2014-09-27 ENCOUNTER — Telehealth: Payer: Self-pay | Admitting: Internal Medicine

## 2014-09-27 NOTE — Telephone Encounter (Signed)
Dr Artist Pais is out of the office and not sure when he will return. Pt has had labs and needs her cpe w/in a month. Scheduled w/ you on 9/14.  Is that ok?

## 2014-09-28 ENCOUNTER — Encounter: Payer: Managed Care, Other (non HMO) | Admitting: Internal Medicine

## 2014-10-02 NOTE — Telephone Encounter (Signed)
ok 

## 2014-10-03 NOTE — Telephone Encounter (Signed)
Pt has been scheduled.  °

## 2014-10-19 ENCOUNTER — Ambulatory Visit (INDEPENDENT_AMBULATORY_CARE_PROVIDER_SITE_OTHER): Payer: Managed Care, Other (non HMO) | Admitting: Family

## 2014-10-19 ENCOUNTER — Encounter: Payer: Self-pay | Admitting: Family

## 2014-10-19 VITALS — BP 118/82 | Temp 98.0°F | Ht 62.5 in | Wt 154.6 lb

## 2014-10-19 DIAGNOSIS — R002 Palpitations: Secondary | ICD-10-CM | POA: Diagnosis not present

## 2014-10-19 DIAGNOSIS — Z0000 Encounter for general adult medical examination without abnormal findings: Secondary | ICD-10-CM

## 2014-10-19 DIAGNOSIS — Z0001 Encounter for general adult medical examination with abnormal findings: Secondary | ICD-10-CM | POA: Diagnosis not present

## 2014-10-19 NOTE — Progress Notes (Signed)
Subjective:    Patient ID: Terry Camacho, female    DOB: 1975-03-15, 39 y.o.   MRN: [id number]  HPI 39 year old AAF, nonsmoker, is in today for CPX. Sees GYN for pap smears. Last pap 04/2014 and normal. Exercises daily. Married with children.   Reports having palpitations that occur several times a week lasting about a minute then resolving. No chest pain. Has been documenting the episodes over the last 3 months. NO nausea, vomiting, SOB. Reports a family history of palpitations, unsure of the etiology but her mother takes medication for it.    Review of Systems  Constitutional: Negative.   HENT: Negative.   Eyes: Negative.   Respiratory: Negative.   Cardiovascular: Negative.   Gastrointestinal: Negative.   Endocrine: Negative.   Genitourinary: Negative.   Musculoskeletal: Negative.   Skin: Negative.   Allergic/Immunologic: Negative.   Neurological: Negative.   Hematological: Negative.   Psychiatric/Behavioral: Negative.    Past Medical History  Diagnosis Date  . GERD (gastroesophageal reflux disease)   . Hiatal hernia     Social History   Social History  . Marital Status: Married    Spouse Name: N/A  . Number of Children: 2  . Years of Education: N/A   Occupational History  . Customer Service    Social History Main Topics  . Smoking status: Never Smoker   . Smokeless tobacco: Never Used  . Alcohol Use: No     Comment: Rarely  . Drug Use: No  . Sexual Activity: Not on file   Other Topics Concern  . Not on file   Social History Narrative   Occupation: working at [employer]   Married - 2 children (daugter 9 and 6)   Never Smoked   Alcohol use-yes (seldom)        Past Surgical History  Procedure Laterality Date  . Esophagogastroduodenoscopy  10/14/2005    esophagitis    Family History  Problem Relation Age of Onset  . Hypertension Mother 78  . Other Father 16    healty  . Colon cancer Neg Hx   . Liver cancer Maternal Uncle   . Stomach cancer  Maternal Aunt     Allergies  Allergen Reactions  . Amoxicillin     REACTION: Hives    Current Outpatient Prescriptions on File Prior to Visit  Medication Sig Dispense Refill  . fluticasone (FLONASE) 50 MCG/ACT nasal spray Place 2 sprays into the nose daily. (Patient not taking: Reported on 10/19/2014) 16 g 2  . methocarbamol (ROBAXIN) 500 MG tablet Take 1 tablet (500 mg total) by mouth 2 (two) times daily as needed. (Patient not taking: Reported on 10/19/2014) 30 tablet 1   No current facility-administered medications on file prior to visit.    BP 118/82 mmHg  Temp(Src) 98 F (36.7 C) (Oral)  Ht 5' 2.5" (1.588 m)  Wt 154 lb 9.6 oz (70.126 kg)  BMI 27.81 kg/m2chart    Objective:   Physical Exam  Constitutional: She is oriented to person, place, and time. She appears well-developed and well-nourished.  HENT:  Head: Normocephalic.  Right Ear: External ear normal.  Left Ear: External ear normal.  Nose: Nose normal.  Mouth/Throat: Oropharynx is clear and moist.  Eyes: Conjunctivae and EOM are normal. Pupils are equal, round, and reactive to light.  Neck: Normal range of motion. Neck supple. No thyromegaly present.  Cardiovascular: Normal rate, regular rhythm and normal heart sounds.   Pulmonary/Chest: Effort normal and breath sounds normal.  Abdominal: Soft. Bowel sounds are normal. She exhibits no distension.  Musculoskeletal: Normal range of motion.  Neurological: She is alert and oriented to person, place, and time. She has normal reflexes.  Skin: Skin is warm and dry.  Psychiatric: She has a normal mood and affect.          Assessment & Plan:  Jinnie was seen today for annual exam.  Diagnoses and all orders for this visit:  Preventative health care  Palpitations -     Ambulatory referral to Cardiology -     Holter monitor - 48 hour; Future   To the ED if chest pain or SOB accompanies palpitations.   Encouraged a healthy diet, exercise, breast exams. Call the  office with any questions or concerns. Recheck in 1 year and sooner as needed.

## 2014-10-19 NOTE — Patient Instructions (Signed)
Fat and Cholesterol Control Diet Fat and cholesterol levels in your blood and organs are influenced by your diet. High levels of fat and cholesterol may lead to diseases of the heart, small and large blood vessels, gallbladder, liver, and pancreas. CONTROLLING FAT AND CHOLESTEROL WITH DIET Although exercise and lifestyle factors are important, your diet is key. That is because certain foods are known to raise cholesterol and others to lower it. The goal is to balance foods for their effect on cholesterol and more importantly, to replace saturated and trans fat with other types of fat, such as monounsaturated fat, polyunsaturated fat, and omega-3 fatty acids. On average, a person should consume no more than 15 to 17 g of saturated fat daily. Saturated and trans fats are considered "bad" fats, and they will raise LDL cholesterol. Saturated fats are primarily found in animal products such as meats, butter, and cream. However, that does not mean you need to give up all your favorite foods. Today, there are good tasting, low-fat, low-cholesterol substitutes for most of the things you like to eat. Choose low-fat or nonfat alternatives. Choose round or loin cuts of red meat. These types of cuts are lowest in fat and cholesterol. Chicken (without the skin), fish, veal, and ground turkey breast are great choices. Eliminate fatty meats, such as hot dogs and salami. Even shellfish have little or no saturated fat. Have a 3 oz (85 g) portion when you eat lean meat, poultry, or fish. Trans fats are also called "partially hydrogenated oils." They are oils that have been scientifically manipulated so that they are solid at room temperature resulting in a longer shelf life and improved taste and texture of foods in which they are added. Trans fats are found in stick margarine, some tub margarines, cookies, crackers, and baked goods.  When baking and cooking, oils are a great substitute for butter. The monounsaturated oils are  especially beneficial since it is believed they lower LDL and raise HDL. The oils you should avoid entirely are saturated tropical oils, such as coconut and palm.  Remember to eat a lot from food groups that are naturally free of saturated and trans fat, including fish, fruit, vegetables, beans, grains (barley, rice, couscous, bulgur wheat), and pasta (without cream sauces).  IDENTIFYING FOODS THAT LOWER FAT AND CHOLESTEROL  Soluble fiber may lower your cholesterol. This type of fiber is found in fruits such as apples, vegetables such as broccoli, potatoes, and carrots, legumes such as beans, peas, and lentils, and grains such as barley. Foods fortified with plant sterols (phytosterol) may also lower cholesterol. You should eat at least 2 g per day of these foods for a cholesterol lowering effect.  Read package labels to identify low-saturated fats, trans fat free, and low-fat foods at the supermarket. Select cheeses that have only 2 to 3 g saturated fat per ounce. Use a heart-healthy tub margarine that is free of trans fats or partially hydrogenated oil. When buying baked goods (cookies, crackers), avoid partially hydrogenated oils. Breads and muffins should be made from whole grains (whole-wheat or whole oat flour, instead of "flour" or "enriched flour"). Buy non-creamy canned soups with reduced salt and no added fats.  FOOD PREPARATION TECHNIQUES  Never deep-fry. If you must fry, either stir-fry, which uses very little fat, or use non-stick cooking sprays. When possible, broil, bake, or roast meats, and steam vegetables. Instead of putting butter or margarine on vegetables, use lemon and herbs, applesauce, and cinnamon (for squash and sweet potatoes). Use nonfat   yogurt, salsa, and low-fat dressings for salads.  LOW-SATURATED FAT / LOW-FAT FOOD SUBSTITUTES Meats / Saturated Fat (g)  Avoid: Steak, marbled (3 oz/85 g) / 11 g  Choose: Steak, lean (3 oz/85 g) / 4 g  Avoid: Hamburger (3 oz/85 g) / 7  g  Choose: Hamburger, lean (3 oz/85 g) / 5 g  Avoid: Ham (3 oz/85 g) / 6 g  Choose: Ham, lean cut (3 oz/85 g) / 2.4 g  Avoid: Chicken, with skin, dark meat (3 oz/85 g) / 4 g  Choose: Chicken, skin removed, dark meat (3 oz/85 g) / 2 g  Avoid: Chicken, with skin, light meat (3 oz/85 g) / 2.5 g  Choose: Chicken, skin removed, light meat (3 oz/85 g) / 1 g Dairy / Saturated Fat (g)  Avoid: Whole milk (1 cup) / 5 g  Choose: Low-fat milk, 2% (1 cup) / 3 g  Choose: Low-fat milk, 1% (1 cup) / 1.5 g  Choose: Skim milk (1 cup) / 0.3 g  Avoid: Hard cheese (1 oz/28 g) / 6 g  Choose: Skim milk cheese (1 oz/28 g) / 2 to 3 g  Avoid: Cottage cheese, 4% fat (1 cup) / 6.5 g  Choose: Low-fat cottage cheese, 1% fat (1 cup) / 1.5 g  Avoid: Ice cream (1 cup) / 9 g  Choose: Sherbet (1 cup) / 2.5 g  Choose: Nonfat frozen yogurt (1 cup) / 0.3 g  Choose: Frozen fruit bar / trace  Avoid: Whipped cream (1 tbs) / 3.5 g  Choose: Nondairy whipped topping (1 tbs) / 1 g Condiments / Saturated Fat (g)  Avoid: Mayonnaise (1 tbs) / 2 g  Choose: Low-fat mayonnaise (1 tbs) / 1 g  Avoid: Butter (1 tbs) / 7 g  Choose: Extra light margarine (1 tbs) / 1 g  Avoid: Coconut oil (1 tbs) / 11.8 g  Choose: Olive oil (1 tbs) / 1.8 g  Choose: Corn oil (1 tbs) / 1.7 g  Choose: Safflower oil (1 tbs) / 1.2 g  Choose: Sunflower oil (1 tbs) / 1.4 g  Choose: Soybean oil (1 tbs) / 2.4 g  Choose: Canola oil (1 tbs) / 1 g Document Released: 01/21/2005 Document Revised: 05/18/2012 Document Reviewed: 04/21/2013 Exit[employer] Patient Information 2015 [employer]. This information is not intended to replace advice given to you by your health care provider. Make sure you discuss any questions you have with your health care provider.  

## 2014-10-19 NOTE — Progress Notes (Signed)
Pre visit review using our clinic review tool, if applicable. No additional management support is needed unless otherwise documented below in the visit note. 

## 2014-11-11 ENCOUNTER — Ambulatory Visit: Payer: Managed Care, Other (non HMO) | Admitting: Internal Medicine

## 2014-11-17 NOTE — Progress Notes (Signed)
Cardiology Office Note   Date:  11/18/2014   ID:  Terry PlummerSheila Camacho, DOB 05/30/1975, MRN [id number]  PCP:  Terry Lemonsobert Yoo, DO  Cardiologist:   Terry Camacho, Terry Camacho P, MD   Chief Complaint  Patient presents with  . New Evaluation    palpitations  . Shortness of Breath    not during exercise, just when walking around her house or when going for a walk//happens every once in a while  . Edema    no recently, was happening a while ago      History of Present Illness: Terry Camacho is a 39 y.o. female who presents for an evaluation of palpitations.  She reports having heart palpitations for a while.  She is unsure of how long it's been going on, but it has been at least several months. They occur sporadically and lasts for a few minutes at a time. There is no associated shortness of breath, but she feels as though she's on a roller coaster ride and her heart is dropping. She denies any lightheadedness or dizziness. She also denies chest pain. She notices that it tends to happen more when she is upset. Ms. Terry SawyerWilliamson exercises 5 days per week doing strength training, cardio, Zumba, or spin class. She does not notice the palpitations with exertion.    She wasn't particularily concerned about it, but reported it to her PCP, who recommended that she be evaluated by a cardiologist. At that appointment her BMP, CBC and TSH were unremarkable.  She notes that her mother has an unknown arrhythmia and is on medication for it. She also remembers her brother having a syncopal episode that was somehow related to his heart and he ultimately needed to take medication as well. She denies any family history of sudden cardiac death.    She describes her diet is healthy. She does occasionally craves sweets. Overall her weight has been stable. She did note an episode of lower extremity edema last year but this has not recurred.  She does sometimes short of breath at night, though she associates this with her hiatal  hernia.  She denies PND.    Past Medical History  Diagnosis Date  . GERD (gastroesophageal reflux disease)   . Hiatal hernia   . Palpitations 11/18/2014    Past Surgical History  Procedure Laterality Date  . Esophagogastroduodenoscopy  10/14/2005    esophagitis     Current Outpatient Prescriptions  Medication Sig Dispense Refill  . fluticasone (FLONASE) 50 MCG/ACT nasal spray Place 2 sprays into the nose daily. (Patient not taking: Reported on 10/19/2014) 16 g 2   No current facility-administered medications for this visit.    Allergies:   Amoxicillin    Social History:  The patient  reports that she has never smoked. She has never used smokeless tobacco. She reports that she does not drink alcohol or use illicit drugs.   Family History:  The patient's family history includes Hypertension (age of onset: 2956) in her mother; Liver cancer in her maternal uncle; Other (age of onset: 1954) in her father; Stomach cancer in her maternal aunt. There is no history of Colon cancer.    ROS:  Please see the history of present illness.   Otherwise, review of systems are positive for none.   All other systems are reviewed and negative.    PHYSICAL EXAM: VS:  BP 112/86 mmHg  Pulse 57  Ht 5' 2.5" (1.588 m)  Wt 69.99 kg (154 lb 4.8 oz)  BMI 27.75  kg/m2 , BMI Body mass index is 27.75 kg/(m^2). GENERAL:  Well appearing HEENT:  Pupils equal round and reactive, fundi not visualized, oral mucosa unremarkable NECK:  No jugular venous distention, waveform within normal limits, carotid upstroke brisk and symmetric, no bruits, no thyromegaly LYMPHATICS:  No cervical adenopathy LUNGS:  Clear to auscultation bilaterally HEART: Bradycardic.  Regular rhythm.  PMI not displaced or sustained,S1 and S2 within normal limits, no S3, no S4, no clicks, no rubs, I/VI systolic flow murmur ABD:  Flat, positive bowel sounds normal in frequency in pitch, no bruits, no rebound, no guarding, no midline pulsatile mass,  no hepatomegaly, no splenomegaly EXT:  2 plus pulses throughout, no edema, no cyanosis no clubbing SKIN:  No rashes no nodules NEURO:  Cranial nerves II through XII grossly intact, motor grossly intact throughout PSYCH:  Cognitively intact, oriented to person place and time    EKG:  EKG is ordered today. The ekg ordered today demonstrates sinus bradycardia rate 57 bpm.     Recent Labs: 09/22/2014: ALT 11; BUN 19; Creatinine, Ser 1.08; Hemoglobin 13.0; Platelets 195.0; Potassium 3.8; Sodium 139; TSH 1.44    Lipid Panel    Component Value Date/Time   CHOL 171 09/22/2014 0811   TRIG 31.0 09/22/2014 0811   HDL 60.70 09/22/2014 0811   CHOLHDL 3 09/22/2014 0811   VLDL 6.2 09/22/2014 0811   LDLCALC 104* 09/22/2014 0811      Wt Readings from Last 3 Encounters:  11/18/14 69.99 kg (154 lb 4.8 oz)  10/19/14 70.126 kg (154 lb 9.6 oz)  08/25/12 71.215 kg (157 lb)      ASSESSMENT AND PLAN:  # Palpitations: Ms. Terry Camacho likely has PACs or PVCs.  There are no alarm symptoms such as syncope, chest pain, or shortness of breath. It is possible that she is having SVT or other more malignant arrhythmias. She has no signs of heart failure on exam and certainly does not have any evidence of ischemia, and she exercises regularly without symptoms. She had a recent thyroid tests that show normal thyroid function and her electrolytes were unremarkable, as was her blood count. We will obtain a 7 day event monitor to correlate her symptoms with the underlying heart rhythm.  # CV Disease Prevention: Ms. Terry Camacho was praised for her excellent exercise regimen. She had a recent lipid panel that was at goal. We will not make any changes at this time.  Current medicines are reviewed at length with the patient today.  The patient does not have concerns regarding medicines.  The following changes have been made:  no change  Labs/ tests ordered today include:   Orders Placed This Encounter  Procedures    . Cardiac event monitor  . EKG 12-Lead     Disposition:   FU with Shantee Hayne C. Duke Salvia, MD in 1 month   Signed, Terry Hook, MD  11/18/2014 1:19 PM    [facility]

## 2014-11-18 ENCOUNTER — Ambulatory Visit (INDEPENDENT_AMBULATORY_CARE_PROVIDER_SITE_OTHER): Payer: Managed Care, Other (non HMO) | Admitting: Cardiovascular Disease

## 2014-11-18 ENCOUNTER — Encounter (INDEPENDENT_AMBULATORY_CARE_PROVIDER_SITE_OTHER): Payer: Managed Care, Other (non HMO)

## 2014-11-18 ENCOUNTER — Encounter: Payer: Self-pay | Admitting: Cardiovascular Disease

## 2014-11-18 VITALS — BP 112/86 | HR 57 | Ht 62.5 in | Wt 154.3 lb

## 2014-11-18 DIAGNOSIS — R002 Palpitations: Secondary | ICD-10-CM

## 2014-11-18 DIAGNOSIS — Z87898 Personal history of other specified conditions: Secondary | ICD-10-CM | POA: Insufficient documentation

## 2014-11-18 HISTORY — DX: Palpitations: R00.2

## 2014-11-18 NOTE — Patient Instructions (Signed)
Your physician has recommended that you wear an event monitor 7 DAYS . Event monitors are medical devices that record the heart's electrical activity. Doctors most often us these monitors to diagnose arrhythmias. Arrhythmias are problems with the speed or rhythm of the heartbeat. The monitor is a small, portable device. You can wear one while you do your normal daily activities. This is usually used to diagnose what is causing palpitations/syncope (passing out).   WILL CALL YOU WITH RESULTS   NO OTHER CHANGES AT PRESENT.  Your physician recommends that you schedule a follow-up appointment in 1 MONTHS WITH DR [place].

## 2014-12-23 ENCOUNTER — Ambulatory Visit: Payer: Managed Care, Other (non HMO) | Admitting: Cardiovascular Disease

## 2015-01-04 NOTE — Progress Notes (Signed)
Cardiology Office Note   Date:  01/05/2015   ID:  Terry PlummerSheila Camacho, DOB January 20, 1976, MRN [id number]  PCP:  Terry Lemonsobert Yoo, DO  Cardiologist:   Terry Camacho, Terry Thomaston P, MD   Chief Complaint  Patient presents with  . Follow-up    7 day event monitor//pt c/o mild SOB, no other Sx.    Patient ID: Terry Camacho is a 39 y.o. female who presents for an evaluation of palpitations.     Interval History 01/05/15: Terry Camacho wore a 7 day event monitor after her last appointment.  The monitor revealed no abnormal heart rhythms.  She has been doing well.  She continues to have some episodes that feel like skipped heart beats.  She denies any chest pain, shortness of breath, lightheadedness or dizziness.  She continues to exercise without symptoms and is feeling otherwise well.   History of Present Illness 11/18/14: She reports having heart palpitations for a while.  She is unsure of how long it's been going on, but it has been at least several months. They occur sporadically and lasts for a few minutes at a time. There is no associated shortness of breath, but she feels as though she's on a roller coaster ride and her heart is dropping. She denies any lightheadedness or dizziness. She also denies chest pain. She notices that it tends to happen more when she is upset. Terry Camacho exercises 5 days per week doing strength training, cardio, Zumba, or spin class. She does not notice the palpitations with exertion.    She wasn't particularily concerned about it, but reported it to her PCP, who recommended that she be evaluated by a cardiologist. At that appointment her BMP, CBC and TSH were unremarkable.  She notes that her mother has an unknown arrhythmia and is on medication for it. She also remembers her brother having a syncopal episode that was somehow related to his heart and he ultimately needed to take medication as well. She denies any family history of sudden cardiac death.    She describes  her diet is healthy. She does occasionally craves sweets. Overall her weight has been stable. She did note an episode of lower extremity edema last year but this has not recurred.  She does sometimes short of breath at night, though she associates this with her hiatal hernia.  She denies PND.    Past Medical History  Diagnosis Date  . GERD (gastroesophageal reflux disease)   . Hiatal hernia   . Palpitations 11/18/2014    Past Surgical History  Procedure Laterality Date  . Esophagogastroduodenoscopy  10/14/2005    esophagitis     Current Outpatient Prescriptions  Medication Sig Dispense Refill  . fluticasone (FLONASE) 50 MCG/ACT nasal spray Place 2 sprays into the nose daily. 16 g 2   No current facility-administered medications for this visit.    Allergies:   Amoxicillin    Social History:  The patient  reports that she has never smoked. She has never used smokeless tobacco. She reports that she does not drink alcohol or use illicit drugs.   Family History:  The patient's family history includes Hypertension (age of onset: 156) in her mother; Liver cancer in her maternal uncle; Other (age of onset: 5954) in her father; Stomach cancer in her maternal aunt. There is no history of Colon cancer.    ROS:  Please see the history of present illness.  Otherwise, review of systems are positive for none.   All other systems are reviewed  and negative.    PHYSICAL EXAM: VS:  BP 104/70 mmHg  Pulse 70  Ht 5' 2.5" (1.588 m)  Wt 72.122 kg (159 lb)  BMI 28.60 kg/m2 , BMI Body mass index is 28.6 kg/(m^2). GENERAL:  Well appearing HEENT:  Pupils equal round and reactive, fundi not visualized, oral mucosa unremarkable NECK:  No jugular venous distention, waveform within normal limits, carotid upstroke brisk and symmetric, no bruits, no thyromegaly LYMPHATICS:  No cervical adenopathy LUNGS:  Clear to auscultation bilaterally HEART: Bradycardic.  Regular rhythm.  PMI not displaced or sustained,S1  and S2 within normal limits, no S3, no S4, no clicks, no rubs, I/VI systolic flow murmur ABD:  Flat, positive bowel sounds normal in frequency in pitch, no bruits, no rebound, no guarding, no midline pulsatile mass, no hepatomegaly, no splenomegaly EXT:  2 plus pulses throughout, no edema, no cyanosis no clubbing SKIN:  No rashes no nodules NEURO:  Cranial nerves II through XII grossly intact, motor grossly intact throughout PSYCH:  Cognitively intact, oriented to person place and time    EKG:  EKG is ordered today. The ekg ordered today demonstrates sinus bradycardia rate 57 bpm.    7 Day Event Monitor 11/18/14:  Quality: Fair. Baseline artifact. Predominant rhythm: sinus Average heart rate: 73 bpm  Max heart rate: 157 bpm Min heart rate: 50 bpm Pauses >2.5 seconds: 0  No PACs or PVCs noted.   Patient did submit a symptom diary. She reported multiple episodes of skipped heart beats, at which time sinus rhythm, sinus bradycardia (50s) and sinus tachycardia (105) were noted. No arrhythmias were noted. Recent Labs: 09/22/2014: ALT 11; BUN 19; Creatinine, Ser 1.08; Hemoglobin 13.0; Platelets 195.0; Potassium 3.8; Sodium 139; TSH 1.44    Lipid Panel    Component Value Date/Time   CHOL 171 09/22/2014 0811   TRIG 31.0 09/22/2014 0811   HDL 60.70 09/22/2014 0811   CHOLHDL 3 09/22/2014 0811   VLDL 6.2 09/22/2014 0811   LDLCALC 104* 09/22/2014 0811      Wt Readings from Last 3 Encounters:  01/05/15 72.122 kg (159 lb)  11/18/14 69.99 kg (154 lb 4.8 oz)  10/19/14 70.126 kg (154 lb 9.6 oz)      ASSESSMENT AND PLAN:  # Palpitations: Monitor revealed no abnormalities.  We reviewed her monitor report today and she is reassured.  # CV Disease Prevention: Terry Camacho was praised for her excellent exercise regimen. She had a recent lipid panel that was at goal. We will not make any changes at this time.  Current medicines are reviewed at length with the patient today.  The  patient does not have concerns regarding medicines.  The following changes have been made:  no change  Labs/ tests ordered today include:   No orders of the defined types were placed in this encounter.     Disposition:   FU with Sheli Dorin C. Duke Salvia, as needed.   Signed, Terry Hook, MD  01/05/2015 11:11 AM    [facility]

## 2015-01-05 ENCOUNTER — Encounter: Payer: Self-pay | Admitting: Cardiovascular Disease

## 2015-01-05 ENCOUNTER — Ambulatory Visit (INDEPENDENT_AMBULATORY_CARE_PROVIDER_SITE_OTHER): Payer: Managed Care, Other (non HMO) | Admitting: Cardiovascular Disease

## 2015-01-05 VITALS — BP 104/70 | HR 70 | Ht 62.5 in | Wt 159.0 lb

## 2015-01-05 DIAGNOSIS — R002 Palpitations: Secondary | ICD-10-CM

## 2015-01-05 NOTE — Patient Instructions (Signed)
Medication Instructions:  Your physician recommends that you continue on your current medications as directed. Please refer to the Current Medication list given to you today.  Labwork: NONE  Testing/Procedures: NONE  Follow-Up: AS NEEDED   If you need a refill on your cardiac medications before your next appointment, please call your pharmacy.  

## 2015-02-23 ENCOUNTER — Ambulatory Visit (INDEPENDENT_AMBULATORY_CARE_PROVIDER_SITE_OTHER): Payer: Managed Care, Other (non HMO)

## 2015-02-23 ENCOUNTER — Ambulatory Visit (INDEPENDENT_AMBULATORY_CARE_PROVIDER_SITE_OTHER): Payer: Managed Care, Other (non HMO) | Admitting: Family Medicine

## 2015-02-23 VITALS — BP 118/76 | HR 80 | Temp 98.1°F | Resp 16 | Ht 62.0 in | Wt 161.0 lb

## 2015-02-23 DIAGNOSIS — J040 Acute laryngitis: Secondary | ICD-10-CM | POA: Diagnosis not present

## 2015-02-23 DIAGNOSIS — R059 Cough, unspecified: Secondary | ICD-10-CM

## 2015-02-23 DIAGNOSIS — J069 Acute upper respiratory infection, unspecified: Secondary | ICD-10-CM

## 2015-02-23 DIAGNOSIS — R05 Cough: Secondary | ICD-10-CM | POA: Diagnosis not present

## 2015-02-23 MED ORDER — BENZONATATE 100 MG PO CAPS: 200.0000 mg | ORAL_CAPSULE | Freq: Two times a day (BID) | ORAL | Status: DC | PRN

## 2015-02-23 MED ORDER — AZITHROMYCIN 250 MG PO TABS: ORAL_TABLET | ORAL | Status: DC

## 2015-02-23 NOTE — Progress Notes (Signed)
Chief Complaint:  Chief Complaint  Patient presents with  . Cough    X 1 month   . Headache    on and off, x 2 weeks   . Chest Pain    x 1 week    HPI: Terry Camacho is a 40 y.o. female who reports to [facility] today complaining of cough x 1 month with assicated with laryngitis. She was doing well on mucinex but recently starting having mid espophageal CP up until recently. She has continued to exercise without problems up until a few days ago, no ear pain, no fevers or chills. She has been able to work out still. Headaches that are diffuse on and off. She has be tryign to rest but no improvement. ON IUD but  Low risk for DVTs.   Low/no risk factors of PE/DVT. Denies recent trauma/surgeires, hx of DVT/PE, long car or plane rides, sedentary lifestyle, or malignancy. + IUD   Past Medical History  Diagnosis Date  . GERD (gastroesophageal reflux disease)   . Hiatal hernia   . Palpitations 11/18/2014   Past Surgical History  Procedure Laterality Date  . Esophagogastroduodenoscopy  10/14/2005    esophagitis   Social History   Social History  . Marital Status: Married    Spouse Name: N/A  . Number of Children: 2  . Years of Education: N/A   Occupational History  . Customer Service    Social History Main Topics  . Smoking status: Never Smoker   . Smokeless tobacco: Never Used  . Alcohol Use: No     Comment: Rarely  . Drug Use: No  . Sexual Activity: Not Asked   Other Topics Concern  . None   Social History Narrative   Occupation: working at [employer]   Married - 2 children (daugter 9 and 6)   Never Smoked   Alcohol use-yes (seldom)       Family History  Problem Relation Age of Onset  . Hypertension Mother 63  . Other Father 45    healty  . Colon cancer Neg Hx   . Liver cancer Maternal Uncle   . Stomach cancer Maternal Aunt    Allergies  Allergen Reactions  . Amoxicillin     REACTION: Hives   Prior to Admission medications   Not on File      ROS: The patient denies fevers, chills, night sweats, unintentional weight loss, chest pain, palpitations, wheezing, dyspnea on exertion, nausea, vomiting, abdominal pain, dysuria, hematuria, melena, numbness, weakness, or tingling.   All other systems have been reviewed and were otherwise negative with the exception of those mentioned in the HPI and as above.    PHYSICAL EXAM: Filed Vitals:   02/23/15 1646  BP: 118/76  Pulse: 80  Temp: 98.1 F (36.7 C)  Resp: 16   Body mass index is 29.44 kg/(m^2).   General: Alert, no acute distress HEENT:  Normocephalic, atraumatic, oropharynx patent. EOMI, PERRLA Erythematous throat, no exudates, TM normal, +/- sinus tenderness, + erythematous/boggy nasal mucosa Cardiovascular:  Regular rate and rhythm, no rubs murmurs or gallops.  No Carotid bruits, radial pulse intact. No pedal edema.  Respiratory: Clear to auscultation bilaterally.  No wheezes, rales, or rhonchi.  No cyanosis, no use of accessory musculature Abdominal: No organomegaly, abdomen is soft and non-tender, positive bowel sounds. No masses. Skin: No rashes. Neurologic: Facial musculature symmetric. Psychiatric: Patient acts appropriately throughout our interaction. Lymphatic: No cervical or submandibular lymphadenopathy Musculoskeletal: Gait intact. No edema,  tenderness   LABS: Results for orders placed or performed in visit on 09/22/14  Hepatic function panel  Result Value Ref Range   Total Bilirubin 0.6 0.2 - 1.2 mg/dL   Bilirubin, Direct 0.1 0.0 - 0.3 mg/dL   Alkaline Phosphatase 57 39 - 117 U/L   AST 17 0 - 37 U/L   ALT 11 0 - 35 U/L   Total Protein 6.9 6.0 - 8.3 g/dL   Albumin 4.3 3.5 - 5.2 g/dL  TSH  Result Value Ref Range   TSH 1.44 0.35 - 4.50 uIU/mL  Lipid panel  Result Value Ref Range   Cholesterol 171 0 - 200 mg/dL   Triglycerides 16.1 0.0 - 149.0 mg/dL   HDL 09.60 >45.40 mg/dL   VLDL 6.2 0.0 - 98.1 mg/dL   LDL Cholesterol 191 (H) 0 - 99 mg/dL    Total CHOL/HDL Ratio 3    NonHDL 110.43   Basic metabolic panel  Result Value Ref Range   Sodium 139 135 - 145 mEq/L   Potassium 3.8 3.5 - 5.1 mEq/L   Chloride 105 96 - 112 mEq/L   CO2 29 19 - 32 mEq/L   Glucose, Bld 95 70 - 99 mg/dL   BUN 19 6 - 23 mg/dL   Creatinine, Ser 4.78 0.40 - 1.20 mg/dL   Calcium 9.0 8.4 - 29.5 mg/dL   GFR 62.13 >08.65 mL/min  CBC with Differential/Platelet  Result Value Ref Range   WBC 4.4 4.0 - 10.5 K/uL   RBC 4.89 3.87 - 5.11 Mil/uL   Hemoglobin 13.0 12.0 - 15.0 g/dL   HCT 78.4 69.6 - 29.5 %   MCV 80.6 78.0 - 100.0 fl   MCHC 32.9 30.0 - 36.0 g/dL   RDW 28.4 13.2 - 44.0 %   Platelets 195.0 150.0 - 400.0 K/uL   Neutrophils Relative % 55.0 43.0 - 77.0 %   Lymphocytes Relative 33.2 12.0 - 46.0 %   Monocytes Relative 10.2 3.0 - 12.0 %   Eosinophils Relative 0.9 0.0 - 5.0 %   Basophils Relative 0.7 0.0 - 3.0 %   Neutro Abs 2.4 1.4 - 7.7 K/uL   Lymphs Abs 1.5 0.7 - 4.0 K/uL   Monocytes Absolute 0.4 0.1 - 1.0 K/uL   Eosinophils Absolute 0.0 0.0 - 0.7 K/uL   Basophils Absolute 0.0 0.0 - 0.1 K/uL  POCT urinalysis dipstick  Result Value Ref Range   Color, UA yellow    Clarity, UA clear    Glucose, UA n    Bilirubin, UA n    Ketones, UA n    Spec Grav, UA 1.020    Blood, UA n    pH, UA 7.0    Protein, UA n    Urobilinogen, UA 0.2    Nitrite, UA n    Leukocytes, UA Negative Negative     EKG/XRAY:   Primary read interpreted by Dr. Conley Rolls at [facility]. Neg for any acute cardiopulm process   ASSESSMENT/PLAN: Encounter Diagnoses  Name Primary?  . Cough   . Laryngitis   . Acute upper respiratory infection Yes   Try otc nasacort Rx Tessalon perles Try sxs treatment, if no improvement then go ahead and take z pack.  She is utd on TDaP but will cover for pertussis and also ahs amox allergy     Gross sideeffects, risk and benefits, and alternatives of medications d/w patient. Patient is aware that all medications have potential sideeffects and we are  unable to predict every sideeffect  or drug-drug interaction that may occur.  Thao Le DO  02/23/2015 5:41 PM

## 2015-02-23 NOTE — Patient Instructions (Signed)
Use nasacort nasal spray

## 2015-03-20 ENCOUNTER — Ambulatory Visit (INDEPENDENT_AMBULATORY_CARE_PROVIDER_SITE_OTHER): Payer: Managed Care, Other (non HMO) | Admitting: Family Medicine

## 2015-03-20 VITALS — BP 131/78 | HR 131 | Temp 99.8°F | Resp 17 | Ht 62.0 in | Wt 164.0 lb

## 2015-03-20 DIAGNOSIS — R51 Headache: Secondary | ICD-10-CM | POA: Diagnosis not present

## 2015-03-20 DIAGNOSIS — R52 Pain, unspecified: Secondary | ICD-10-CM | POA: Diagnosis not present

## 2015-03-20 DIAGNOSIS — R05 Cough: Secondary | ICD-10-CM | POA: Diagnosis not present

## 2015-03-20 DIAGNOSIS — R509 Fever, unspecified: Secondary | ICD-10-CM

## 2015-03-20 DIAGNOSIS — R059 Cough, unspecified: Secondary | ICD-10-CM

## 2015-03-20 DIAGNOSIS — J111 Influenza due to unidentified influenza virus with other respiratory manifestations: Secondary | ICD-10-CM | POA: Diagnosis not present

## 2015-03-20 DIAGNOSIS — R519 Headache, unspecified: Secondary | ICD-10-CM

## 2015-03-20 LAB — POCT INFLUENZA A/B
INFLUENZA A, POC: POSITIVE — AB
INFLUENZA B, POC: NEGATIVE

## 2015-03-20 MED ORDER — HYDROCODONE-HOMATROPINE 5-1.5 MG/5ML PO SYRP: ORAL_SOLUTION | ORAL | Status: DC

## 2015-03-20 NOTE — Progress Notes (Signed)
Subjective:  By signing my name below, I, Stann Ore, attest that this documentation has been prepared under the direction and in the presence of Meredith Staggers, MD. Electronically Signed: Stann Ore, Scribe. 03/20/2015 , 3:35 PM .  Patient was seen in Room 12 .   Patient ID: Terry Camacho, female    DOB: 11-30-1975, 40 y.o.   MRN: [id number] Chief Complaint  Patient presents with  . Chills  . Headache  . Cough   HPI Terry Camacho is a 40 y.o. female  Pt complains of headache and cough with some light yellow phlegm. She notes that this started yesterday at around 7:00AM, waking up to the headache. She had a tmax 100.3 fever last night. She's been having chills and sweats all day with appetite loss. She's taken [facility] cough medications, emergen-c, advil and theraflu. She's last taken advil at 1:00PM today. She denies rash. She denies sick contact at home. She recently a flu shot this year. She denies flu in the past. She denies recent international travels.   We had discussed tamiflu but patient declined.   Patient Active Problem List   Diagnosis Date Noted  . Palpitations 11/18/2014  . Myalgia and myositis 04/08/2012  . Preventative health care 12/17/2011  . Umbilical hernia 12/17/2011  . Edema extremities 10/18/2010  . GERD 04/28/2007   Past Medical History  Diagnosis Date  . GERD (gastroesophageal reflux disease)   . Hiatal hernia   . Palpitations 11/18/2014   Past Surgical History  Procedure Laterality Date  . Esophagogastroduodenoscopy  10/14/2005    esophagitis   Allergies  Allergen Reactions  . Amoxicillin     REACTION: Hives   Prior to Admission medications   Medication Sig Start Date End Date Taking? Authorizing Provider  fluticasone (FLONASE) 50 MCG/ACT nasal spray Place into both nostrils daily.   Yes Historical Provider, MD   Social History   Social History  . Marital Status: Married    Spouse Name: N/A  . Number of Children: 2    . Years of Education: N/A   Occupational History  . Customer Service    Social History Main Topics  . Smoking status: Never Smoker   . Smokeless tobacco: Never Used  . Alcohol Use: No     Comment: Rarely  . Drug Use: No  . Sexual Activity: Not on file   Other Topics Concern  . Not on file   Social History Narrative   Occupation: working at [employer]   Married - 2 children (daugter 9 and 6)   Never Smoked   Alcohol use-yes (seldom)       Review of Systems  Constitutional: Positive for fever, chills, diaphoresis, appetite change and fatigue.  HENT: Positive for congestion.   Respiratory: Positive for cough. Negative for shortness of breath.   Gastrointestinal: Negative for nausea, vomiting and diarrhea.  Musculoskeletal: Positive for myalgias.  Skin: Negative for rash.  Neurological: Positive for headaches.       Objective:   Physical Exam  Constitutional: She is oriented to person, place, and time. She appears well-developed and well-nourished. No distress.  HENT:  Head: Normocephalic and atraumatic.  Mouth/Throat: Posterior oropharyngeal edema and posterior oropharyngeal erythema present. No oropharyngeal exudate.  Eyes: EOM are normal. Pupils are equal, round, and reactive to light.  Neck: Neck supple.  Cardiovascular: Normal rate.   Pulmonary/Chest: Effort normal. No respiratory distress.  Musculoskeletal: Normal range of motion.  Neurological: She is alert and oriented to person, place, and time.  Skin: Skin is warm and dry.  Psychiatric: She has a normal mood and affect. Her behavior is normal.  Nursing note and vitals reviewed.   Filed Vitals:   03/20/15 1504  BP: 131/78  Pulse: 131  Temp: 99.8 F (37.7 C)  TempSrc: Oral  Resp: 17  Height: 5\' 2"  (1.575 m)  Weight: 164 lb (74.39 kg)  SpO2: 97%   Results for orders placed or performed in visit on 03/20/15  POCT Influenza A/B  Result Value Ref Range   Influenza A, POC Positive (A) Negative    Influenza B, POC  Negative       Assessment & Plan:   Terry Camacho is a 40 y.o. female Fever chills - Plan: POCT Influenza A/B  Cough - Plan: POCT Influenza A/B, HYDROcodone-homatropine (HYCODAN) 5-1.5 MG/5ML syrup  Body aches - Plan: POCT Influenza A/B  Nonintractable headache, unspecified chronicity pattern, unspecified headache type - Plan: POCT Influenza A/B  Influenza with respiratory manifestation - Plan: HYDROcodone-homatropine (HYCODAN) 5-1.5 MG/5ML syrup  Influenza A. Early in course of illness, discussed Tamiflu, but this was declined. She does not have chronic medical problems that would necessitate her taking this. Symptomatic care discussed with Mucinex or Mucinex DM, Hycodan cough syrup at night as needed, fluids, rest, RTC precautions. Note provided for work.   Meds ordered this encounter  Medications  . fluticasone (FLONASE) 50 MCG/ACT nasal spray    Sig: Place into both nostrils daily.  Marland Kitchen HYDROcodone-homatropine (HYCODAN) 5-1.5 MG/5ML syrup    Sig: 22m by mouth a bedtime as needed for cough.    Dispense:  120 mL    Refill:  0   Patient Instructions  Tylenol or Motrin as needed for fever, headache, and body aches. Mucinex or Mucinex DM as needed for cough during the day, drink plenty of fluids, and hydrocodone cough syrup if needed at nighttime for cough.   You should avoid return to work and around patients until fever free for 24 hours off of fever reducing medicines.  Return to the clinic or go to the nearest emergency room if any of your symptoms worsen or new symptoms occur.   Influenza, Adult Influenza ("the flu") is a viral infection of the respiratory tract. It occurs more often in winter months because people spend more time in close contact with one another. Influenza can make you feel very sick. Influenza easily spreads from person to person (contagious). CAUSES  Influenza is caused by a virus that infects the respiratory tract. You can catch the  virus by breathing in droplets from an infected person's cough or sneeze. You can also catch the virus by touching something that was recently contaminated with the virus and then touching your mouth, nose, or eyes. RISKS AND COMPLICATIONS You may be at risk for a more severe case of influenza if you smoke cigarettes, have diabetes, have chronic heart disease (such as heart failure) or lung disease (such as asthma), or if you have a weakened immune system. Elderly people and pregnant women are also at risk for more serious infections. The most common problem of influenza is a lung infection (pneumonia). Sometimes, this problem can require emergency medical care and may be life threatening. SIGNS AND SYMPTOMS  Symptoms typically last 4 to 10 days and may include:  Fever.  Chills.  Headache, body aches, and muscle aches.  Sore throat.  Chest discomfort and cough.  Poor appetite.  Weakness or feeling tired.  Dizziness.  Nausea or vomiting. DIAGNOSIS  Diagnosis of  influenza is often made based on your history and a physical exam. A nose or throat swab test can be done to confirm the diagnosis. TREATMENT  In mild cases, influenza goes away on its own. Treatment is directed at relieving symptoms. For more severe cases, your health care provider may prescribe antiviral medicines to shorten the sickness. Antibiotic medicines are not effective because the infection is caused by a virus, not by bacteria. HOME CARE INSTRUCTIONS  Take medicines only as directed by your health care provider.  Use a cool mist humidifier to make breathing easier.  Get plenty of rest until your temperature returns to normal. This usually takes 3 to 4 days.  Drink enough fluid to keep your urine clear or pale yellow.  Cover yourmouth and nosewhen coughing or sneezing,and wash your handswellto prevent thevirusfrom spreading.  Stay homefromwork orschool untilthe fever is gonefor at least 9full  day. PREVENTION  An annual influenza vaccination (flu shot) is the best way to avoid getting influenza. An annual flu shot is now routinely recommended for all adults in the [place] SEEK MEDICAL CARE IF:  You experiencechest pain, yourcough worsens,or you producemore mucus.  Youhave nausea,vomiting, ordiarrhea.  Your fever returns or gets worse. SEEK IMMEDIATE MEDICAL CARE IF:  You havetrouble breathing, you become short of breath,or your skin ornails becomebluish.  You have severe painor stiffnessin the neck.  You develop a sudden headache, or pain in the face or ear.  You have nausea or vomiting that you cannot control. MAKE SURE YOU:   Understand these instructions.  Will watch your condition.  Will get help right away if you are not doing well or get worse.   This information is not intended to replace advice given to you by your health care provider. Make sure you discuss any questions you have with your health care provider.   Document Released: 01/19/2000 Document Revised: 02/11/2014 Document Reviewed: 04/22/2011 Elsevier Interactive Patient Education [employer]     I personally performed the services described in this documentation, which was scribed in my presence. The recorded information has been reviewed and considered, and addended by me as needed.

## 2015-03-20 NOTE — Patient Instructions (Addendum)
Tylenol or Motrin as needed for fever, headache, and body aches. Mucinex or Mucinex DM as needed for cough during the day, drink plenty of fluids, and hydrocodone cough syrup if needed at nighttime for cough.   You should avoid return to work and around patients until fever free for 24 hours off of fever reducing medicines.  Return to the clinic or go to the nearest emergency room if any of your symptoms worsen or new symptoms occur.   Influenza, Adult Influenza ("the flu") is a viral infection of the respiratory tract. It occurs more often in winter months because people spend more time in close contact with one another. Influenza can make you feel very sick. Influenza easily spreads from person to person (contagious). CAUSES  Influenza is caused by a virus that infects the respiratory tract. You can catch the virus by breathing in droplets from an infected person's cough or sneeze. You can also catch the virus by touching something that was recently contaminated with the virus and then touching your mouth, nose, or eyes. RISKS AND COMPLICATIONS You may be at risk for a more severe case of influenza if you smoke cigarettes, have diabetes, have chronic heart disease (such as heart failure) or lung disease (such as asthma), or if you have a weakened immune system. Elderly people and pregnant women are also at risk for more serious infections. The most common problem of influenza is a lung infection (pneumonia). Sometimes, this problem can require emergency medical care and may be life threatening. SIGNS AND SYMPTOMS  Symptoms typically last 4 to 10 days and may include:  Fever.  Chills.  Headache, body aches, and muscle aches.  Sore throat.  Chest discomfort and cough.  Poor appetite.  Weakness or feeling tired.  Dizziness.  Nausea or vomiting. DIAGNOSIS  Diagnosis of influenza is often made based on your history and a physical exam. A nose or throat swab test can be done to confirm  the diagnosis. TREATMENT  In mild cases, influenza goes away on its own. Treatment is directed at relieving symptoms. For more severe cases, your health care provider may prescribe antiviral medicines to shorten the sickness. Antibiotic medicines are not effective because the infection is caused by a virus, not by bacteria. HOME CARE INSTRUCTIONS  Take medicines only as directed by your health care provider.  Use a cool mist humidifier to make breathing easier.  Get plenty of rest until your temperature returns to normal. This usually takes 3 to 4 days.  Drink enough fluid to keep your urine clear or pale yellow.  Cover yourmouth and nosewhen coughing or sneezing,and wash your handswellto prevent thevirusfrom spreading.  Stay homefromwork orschool untilthe fever is gonefor at least 84full day. PREVENTION  An annual influenza vaccination (flu shot) is the best way to avoid getting influenza. An annual flu shot is now routinely recommended for all adults in the [place] SEEK MEDICAL CARE IF:  You experiencechest pain, yourcough worsens,or you producemore mucus.  Youhave nausea,vomiting, ordiarrhea.  Your fever returns or gets worse. SEEK IMMEDIATE MEDICAL CARE IF:  You havetrouble breathing, you become short of breath,or your skin ornails becomebluish.  You have severe painor stiffnessin the neck.  You develop a sudden headache, or pain in the face or ear.  You have nausea or vomiting that you cannot control. MAKE SURE YOU:   Understand these instructions.  Will watch your condition.  Will get help right away if you are not doing well or get worse.  This information is not intended to replace advice given to you by your health care provider. Make sure you discuss any questions you have with your health care provider.   Document Released: 01/19/2000 Document Revised: 02/11/2014 Document Reviewed: 04/22/2011 Elsevier Interactive Patient Education [employer]

## 2015-12-01 ENCOUNTER — Ambulatory Visit: Payer: Managed Care, Other (non HMO) | Admitting: Family Medicine

## 2015-12-08 ENCOUNTER — Encounter: Payer: Self-pay | Admitting: Family Medicine

## 2015-12-08 ENCOUNTER — Ambulatory Visit (INDEPENDENT_AMBULATORY_CARE_PROVIDER_SITE_OTHER): Payer: Managed Care, Other (non HMO) | Admitting: Family Medicine

## 2015-12-08 VITALS — BP 120/70 | HR 73 | Temp 98.6°F | Resp 12 | Ht 62.0 in | Wt 167.4 lb

## 2015-12-08 DIAGNOSIS — H9011 Conductive hearing loss, unilateral, right ear, with unrestricted hearing on the contralateral side: Secondary | ICD-10-CM

## 2015-12-08 DIAGNOSIS — J0110 Acute frontal sinusitis, unspecified: Secondary | ICD-10-CM | POA: Diagnosis not present

## 2015-12-08 DIAGNOSIS — J309 Allergic rhinitis, unspecified: Secondary | ICD-10-CM

## 2015-12-08 DIAGNOSIS — H6123 Impacted cerumen, bilateral: Secondary | ICD-10-CM | POA: Diagnosis not present

## 2015-12-08 MED ORDER — FLUTICASONE PROPIONATE 50 MCG/ACT NA SUSP: 1.0000 | Freq: Two times a day (BID) | NASAL | 6 refills | Status: DC

## 2015-12-08 MED ORDER — CEFDINIR 300 MG PO CAPS: 300.0000 mg | ORAL_CAPSULE | Freq: Two times a day (BID) | ORAL | 0 refills | Status: AC

## 2015-12-08 NOTE — Patient Instructions (Addendum)
A few things to remember from today's visit:   Acute frontal sinusitis, recurrence not specified - Plan: cefdinir (OMNICEF) 300 MG capsule  Conductive hearing loss of right ear, unspecified hearing status on contralateral side  Chronic allergic rhinitis, unspecified seasonality, unspecified trigger  Impacted cerumen, bilateral  Prescribe antibiotic is not amoxicillin but can cause cross-reaction, so monitor for signs of allergy.  Over-the-counter Sudafed 12 hours once daily in the morning for 7-10 days might help with congestion. Continue Flonase 1 spray in each nostril twice daily, nasal saline to rinse night. Over-the-counter antihistaminic, Allegra 180 mg daily or Zyrtec 10 mg daily.  Debrox OR the content of Docusate sodium cap may help with wax in ears, NO Q tips.  Follow up as needed.  He cannot arrange an appointment for a complete physical and fasting labs in a year, before if needed.   Please be sure medication list is accurate. If a new problem present, please set up appointment sooner than planned today.

## 2015-12-08 NOTE — Progress Notes (Signed)
Pre visit review using our clinic review tool, if applicable. No additional management support is needed unless otherwise documented below in the visit note. 

## 2015-12-08 NOTE — Progress Notes (Signed)
HPI:   [name] is a 40 y.o. female, who is here today to establish care with me.  Former PCP: Dr Terry Camacho Last preventive routine visit: 1-2 years ago. She follows regularly with her gynecologist for her female preventive care.   Concerns today: Ear ache and congestion.   A month of right earache and mild hearing loss, which has been constant for the past 2 weeks.  She uses Q tips. She denies any ear trauma or drainage.  Tightness frontal sensation.  Productive cough with yellowish sputum.  No  fever, chill, or myalgias. + Nasal congestion, rhinorrhea, and post nasal drainage.  Denies chest pain, dyspnea, or wheezing.   No Hx of recent travel. No sick contact. No known insect bite. + Hx of allergies, allergic rhinitis. She is on Flonase nasal spray.  OTC medications for this problem: Tylenol Cold and Nyquil.  Symptoms getting worse.      Review of Systems  Constitutional: Negative for activity change, appetite change, fatigue and fever.  HENT: Positive for congestion, ear pain, postnasal drip, sinus pressure and sneezing. Negative for dental problem, ear discharge, facial swelling, mouth sores, nosebleeds, sore throat, trouble swallowing and voice change.   Eyes: Positive for itching. Negative for photophobia, pain, redness and visual disturbance.  Respiratory: Positive for cough. Negative for shortness of breath and wheezing.   Gastrointestinal: Negative for abdominal pain, diarrhea, nausea and vomiting.  Musculoskeletal: Negative for back pain, joint swelling, myalgias and neck pain.  Skin: Negative for color change and rash.  Allergic/Immunologic: Positive for environmental allergies.  Neurological: Positive for headaches. Negative for syncope, weakness and numbness.  Hematological: Negative for adenopathy. Does not bruise/bleed easily.      No current outpatient prescriptions on file prior to visit.   No current facility-administered  medications on file prior to visit.      Past Medical History:  Diagnosis Date  . GERD (gastroesophageal reflux disease)   . Hiatal hernia   . Palpitations 11/18/2014   Allergies  Allergen Reactions  . Amoxicillin     REACTION: Hives    Family History  Problem Relation Age of Onset  . Hypertension Mother 3856  . Other Father 3454    healty  . Colon cancer Neg Hx   . Liver cancer Maternal Uncle   . Stomach cancer Maternal Aunt     Social History   Social History  . Marital status: Married    Spouse name: N/A  . Number of children: 2  . Years of education: N/A   Occupational History  . [employer]   Social History Main Topics  . Smoking status: Never Smoker  . Smokeless tobacco: Never Used  . Alcohol use No     Comment: Rarely  . Drug use: No  . Sexual activity: Not Asked   Other Topics Concern  . None   Social History Narrative   Occupation: working at [employer]   Married - 2 children (daugter 9 and 6)   Never Smoked   Alcohol use-yes (seldom)        Vitals:   12/08/15 0708  BP: 120/70  Pulse: 73  Resp: 12  Temp: 98.6 F (37 C)   O2 sat at RA 97%  Body mass index is 30.61 kg/m.    Physical Exam  Nursing note and vitals reviewed. Constitutional: She is oriented to person, place, and time. She appears well-developed. She does not appear ill. No distress.  HENT:  Head: Atraumatic.  Right Ear: External ear and ear canal normal.  Left Ear: External ear and ear canal normal.  Nose: Rhinorrhea present. Right sinus exhibits maxillary sinus tenderness and frontal sinus tenderness. Left sinus exhibits maxillary sinus tenderness and frontal sinus tenderness.  Mouth/Throat: Oropharynx is clear and moist and mucous membranes are normal.  Bilateral cerumen excess, could not see TM bilateral. Atrophic turbinates, question of nasal polyp left nostril. Postnasal drainage.  Eyes: Conjunctivae and EOM are normal.  Neck: No muscular tenderness  present. No edema and no erythema present. No thyromegaly present.  Cardiovascular: Normal rate and regular rhythm.   No murmur heard. Respiratory: Effort normal and breath sounds normal. No stridor. No respiratory distress.  Lymphadenopathy:       Head (right side): No submandibular adenopathy present.       Head (left side): No submandibular adenopathy present.    She has no cervical adenopathy.  Neurological: She is alert and oriented to person, place, and time. She has normal strength.  Skin: Skin is warm. No rash noted. No erythema.  Psychiatric: She has a normal mood and affect. Her speech is normal.  Well groomed, good eye contact.      ASSESSMENT AND PLAN:     Terry Camacho was seen today for establish care.  Diagnoses and all orders for this visit:   Acute frontal sinusitis, recurrence not specified  Explained he could be viral/allergic, in which case antibiotics will not help Because persistent symptoms and possibility of right OM Abx was recommended today. We discussed some side effects, including cross reaction with amoxicillin. OTC decongestants might also help, some side effects discussed. Follow-up as needed.  -     cefdinir (OMNICEF) 300 MG capsule; Take 1 capsule (300 mg total) by mouth 2 (two) times daily.  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 Unchanged after ear lavage, still impacted cerumen left in right ear after procedure. Instructed about warning signs.  Chronic allergic rhinitis, unspecified seasonality, unspecified trigger  OTC antihistaminic recommended. Continue Flonase twice daily, nasal irrigations daily. Follow-up as needed.   -     fluticasone (FLONASE) 50 MCG/ACT nasal spray; Place 1 spray into both nostrils 2 (two) times daily.  Impacted cerumen, bilateral  After verbal consent ear lavage of right ear was done, not able to remove all cerumen. She  tolerated procedure well. Left ear was not recommended because she is not reporting symptoms in this ear. She will continue with OTC Debrox or similar products to help with cerumen excess. Avoid Q-tips in ears.       Terry G. SwazilandJordan, MD  [facility]. [place] office.

## 2016-02-27 IMAGING — CR DG CHEST 2V
2 series · 2 of 2 positions shown · non-contrast
Comparison: None.

CLINICAL DATA: Cough for 1 month.

EXAM:
CHEST  2 VIEW

[PA]
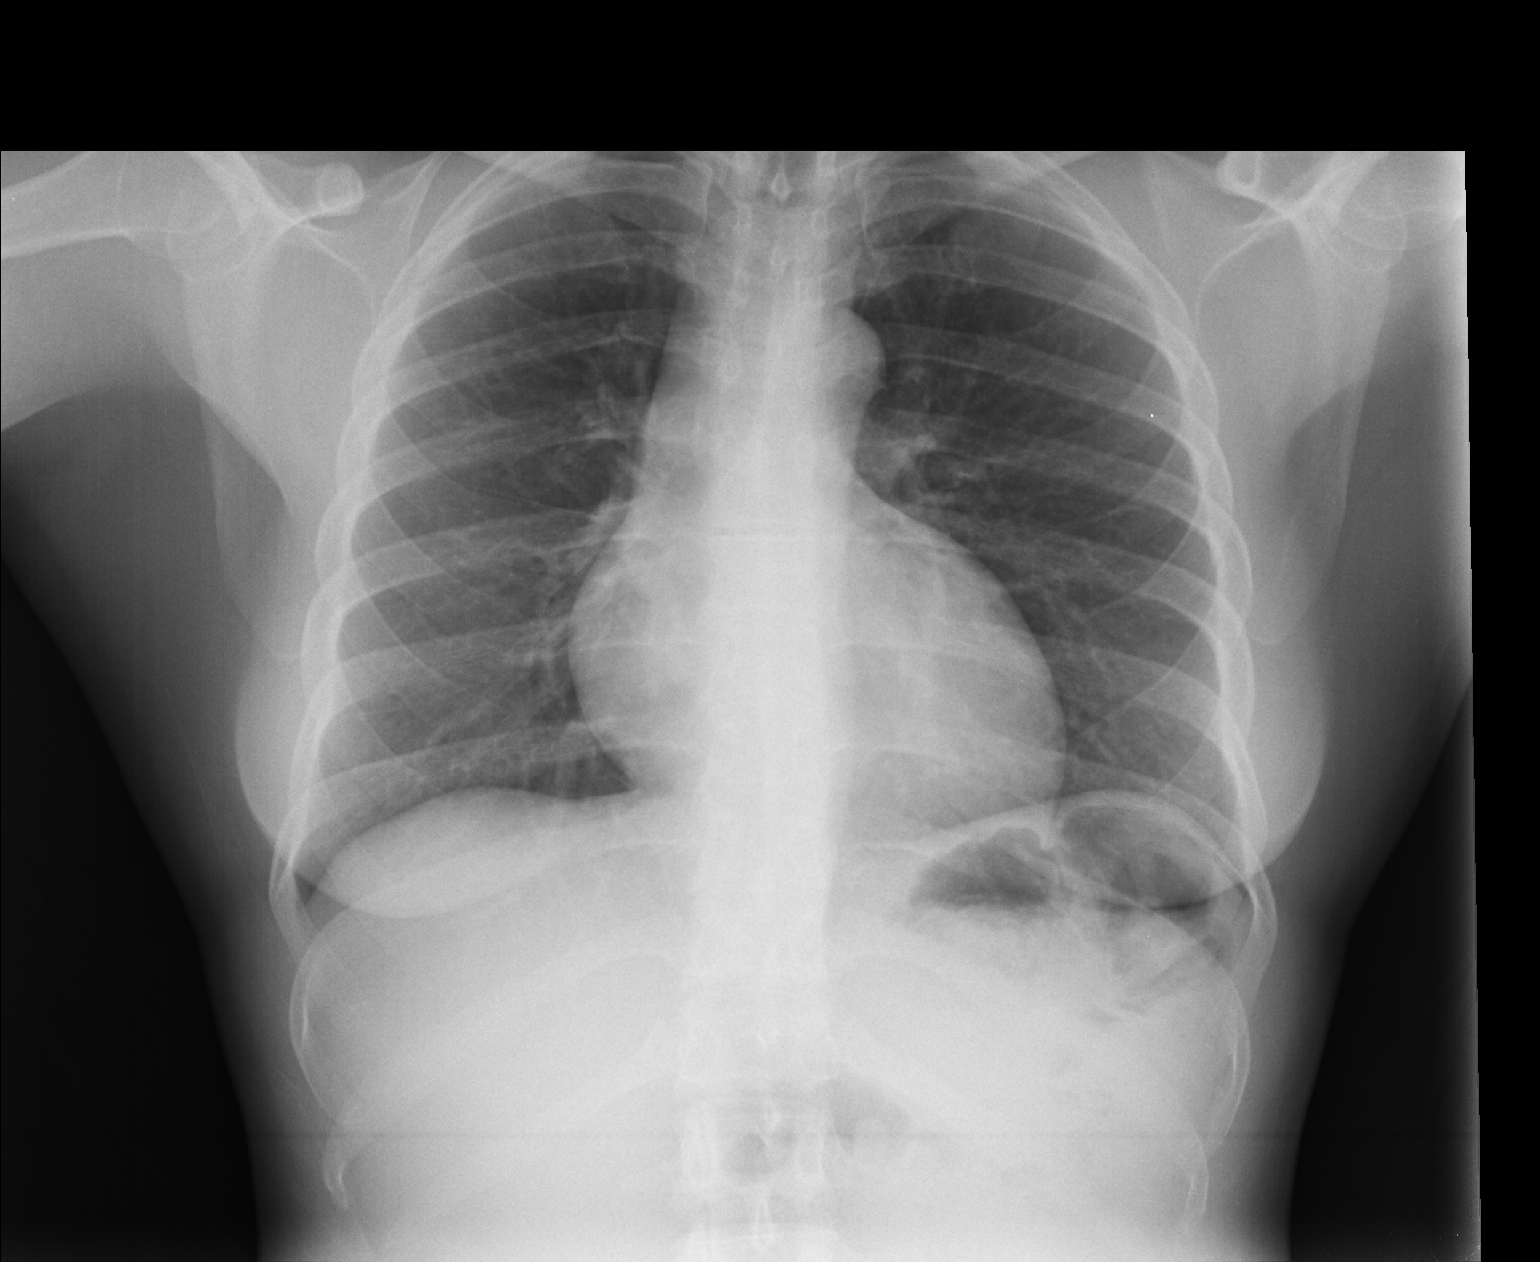

[lateral]
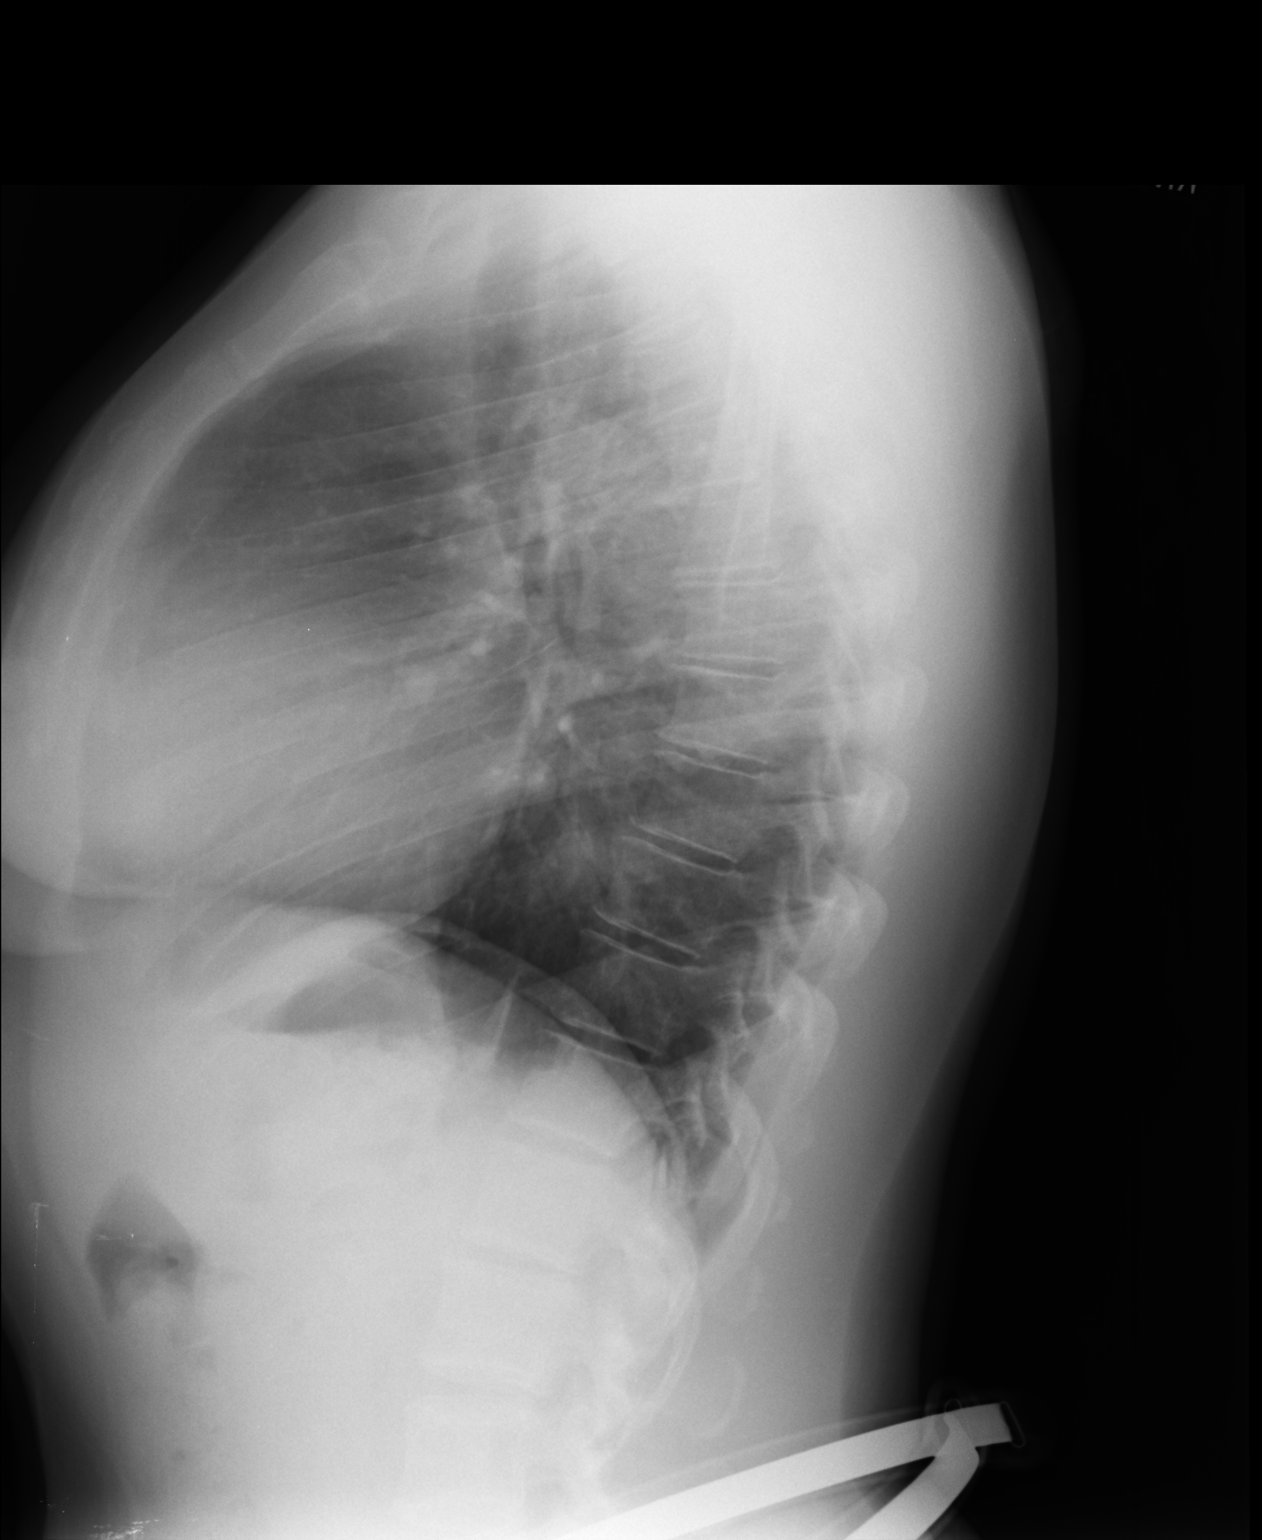

[2 of 2 positions shown; findings below may reference images not displayed]

FINDINGS: The heart size and mediastinal contours are within normal limits.
Both lungs are clear. No pneumothorax or pleural effusion is noted.
The visualized skeletal structures are unremarkable.
IMPRESSION: No active cardiopulmonary disease.

## 2016-03-22 ENCOUNTER — Telehealth: Payer: Self-pay | Admitting: Family Medicine

## 2016-03-22 NOTE — Telephone Encounter (Signed)
Of course! It is fine to transfer. Thanks, BJ

## 2016-03-22 NOTE — Telephone Encounter (Signed)
Pt is requesting to transfer care from Dr SwazilandJordan to Dr Ermalene SearingBedsole. She was a former Dr Artist PaisYoo pt, has seen Dr SwazilandJordan once but wants to closer to home and establish with Dr Ermalene SearingBedsole. OK to transfer?

## 2016-03-22 NOTE — Telephone Encounter (Signed)
I see no concerns abotu transfer.. Make pt 30 min appt visit with me to establish care.

## 2016-05-07 ENCOUNTER — Encounter: Payer: Self-pay | Admitting: *Deleted

## 2016-05-07 ENCOUNTER — Ambulatory Visit (INDEPENDENT_AMBULATORY_CARE_PROVIDER_SITE_OTHER): Payer: Managed Care, Other (non HMO) | Admitting: Family Medicine

## 2016-05-07 ENCOUNTER — Encounter: Payer: Self-pay | Admitting: Family Medicine

## 2016-05-07 VITALS — BP 100/78 | HR 62 | Temp 98.3°F | Ht 63.0 in | Wt 173.5 lb

## 2016-05-07 DIAGNOSIS — E669 Obesity, unspecified: Secondary | ICD-10-CM | POA: Insufficient documentation

## 2016-05-07 DIAGNOSIS — Z87898 Personal history of other specified conditions: Secondary | ICD-10-CM | POA: Diagnosis not present

## 2016-05-07 DIAGNOSIS — Z13220 Encounter for screening for lipoid disorders: Secondary | ICD-10-CM | POA: Diagnosis not present

## 2016-05-07 DIAGNOSIS — Z130 Encounter for screening for diseases of the blood and blood-forming organs and certain disorders involving the immune mechanism: Secondary | ICD-10-CM

## 2016-05-07 DIAGNOSIS — Z1329 Encounter for screening for other suspected endocrine disorder: Secondary | ICD-10-CM

## 2016-05-07 DIAGNOSIS — K219 Gastro-esophageal reflux disease without esophagitis: Secondary | ICD-10-CM

## 2016-05-07 DIAGNOSIS — Z0000 Encounter for general adult medical examination without abnormal findings: Secondary | ICD-10-CM | POA: Diagnosis not present

## 2016-05-07 LAB — COMPREHENSIVE METABOLIC PANEL
ALBUMIN: 4.3 g/dL (ref 3.5–5.2)
ALK PHOS: 47 U/L (ref 39–117)
ALT: 15 U/L (ref 0–35)
AST: 23 U/L (ref 0–37)
BILIRUBIN TOTAL: 0.6 mg/dL (ref 0.2–1.2)
BUN: 8 mg/dL (ref 6–23)
CO2: 28 mEq/L (ref 19–32)
CREATININE: 0.88 mg/dL (ref 0.40–1.20)
Calcium: 9.1 mg/dL (ref 8.4–10.5)
Chloride: 105 mEq/L (ref 96–112)
GFR: 91.78 mL/min (ref >60.00)
Glucose, Bld: 97 mg/dL (ref 70–99)
Potassium: 4.1 mEq/L (ref 3.5–5.1)
SODIUM: 138 meq/L (ref 135–145)
TOTAL PROTEIN: 7 g/dL (ref 6.0–8.3)

## 2016-05-07 LAB — CBC WITH DIFFERENTIAL/PLATELET
BASOS ABS: 0 10*3/uL (ref 0.0–0.1)
BASOS PCT: 0.4 % (ref 0.0–3.0)
EOS ABS: 0 10*3/uL (ref 0.0–0.7)
Eosinophils Relative: 0.1 % (ref 0.0–5.0)
HCT: 40.2 % (ref 36.0–46.0)
Hemoglobin: 13.1 g/dL (ref 12.0–15.0)
Lymphocytes Relative: 23.3 % (ref 12.0–46.0)
Lymphs Abs: 2.2 10*3/uL (ref 0.7–4.0)
MCHC: 32.5 g/dL (ref 30.0–36.0)
MCV: 79.8 fl (ref 78.0–100.0)
MONO ABS: 0.7 10*3/uL (ref 0.1–1.0)
Monocytes Relative: 7.2 % (ref 3.0–12.0)
Neutro Abs: 6.6 10*3/uL (ref 1.4–7.7)
Neutrophils Relative %: 69 % (ref 43.0–77.0)
Platelets: 204 10*3/uL (ref 150.0–400.0)
RBC: 5.04 Mil/uL (ref 3.87–5.11)
RDW: 13.1 % (ref 11.5–15.5)
WBC: 9.6 10*3/uL (ref 4.0–10.5)

## 2016-05-07 LAB — LIPID PANEL
CHOLESTEROL: 154 mg/dL (ref 0–200)
HDL: 63.1 mg/dL (ref >39.00)
LDL Cholesterol: 84 mg/dL (ref 0–99)
NonHDL: 90.76
TRIGLYCERIDES: 34 mg/dL (ref 0.0–149.0)
Total CHOL/HDL Ratio: 2
VLDL: 6.8 mg/dL (ref 0.0–40.0)

## 2016-05-07 LAB — TSH: TSH: 0.99 u[IU]/mL (ref 0.35–4.50)

## 2016-05-07 NOTE — Assessment & Plan Note (Signed)
Stable , rare occurrence.

## 2016-05-07 NOTE — Assessment & Plan Note (Signed)
Encouraged exercise, weight loss, healthy eating habits. ? ?

## 2016-05-07 NOTE — Progress Notes (Signed)
   Subjective:    Patient ID: Terry Camacho, female    DOB: [birth year], 41 y.o.   MRN: [id number]  HPI   41 year old female presents to establish care. She previously was seen by Dr. Artist Pais.   She has not seen Dr. Artist Pais since 2014.  She has been treated for acute issue in last few years. Sees GYN: Last  07/2015, pap  Performed.   Last visit acute sinus infection 01-25-2016.   GERD  Rarely. Dies not use a med to treat.   No further palpitations.  Exercise: She work out daily She is less active in current job. Diet: Moderate diet.  Blood pressure 100/78, pulse 62, temperature 98.3 F (36.8 C), temperature source Oral, height  (1.6 m), weight 173 lb 8 oz (78.7 kg). Wt Readings from Last 3 Encounters:  05/07/16 173 lb 8 oz (78.7 kg)  12/08/15 167 lb 6 oz (75.9 kg)  03/20/15 164 lb (74.4 kg)  Body mass index is 30.73 kg/m.  Social History /Family History/Past Medical History reviewed and updated if needed.   Review of Systems  Constitutional: Negative for fatigue and fever.  HENT: Negative for congestion.   Eyes: Negative for pain.  Respiratory: Negative for cough and shortness of breath.   Cardiovascular: Negative for chest pain, palpitations and leg swelling.  Gastrointestinal: Negative for abdominal pain.  Genitourinary: Negative for dysuria and vaginal bleeding.  Musculoskeletal: Negative for back pain.  Neurological: Negative for syncope, light-headedness and headaches.  Psychiatric/Behavioral: Negative for dysphoric mood.       Objective:   Physical Exam  Constitutional: Vital signs are normal. She appears well-developed and well-nourished. She is cooperative.  Non-toxic appearance. She does not appear ill. No distress.  HENT:  Head: Normocephalic.  Right Ear: Hearing, tympanic membrane, external ear and ear canal normal.  Left Ear: Hearing, tympanic membrane, external ear and ear canal normal.  Nose: Nose normal.  Eyes: Conjunctivae, EOM and lids are normal.  Pupils are equal, round, and reactive to light. Lids are everted and swept, no foreign bodies found.  Neck: Trachea normal and normal range of motion. Neck supple. Carotid bruit is not present. No thyroid mass and no thyromegaly present.  Cardiovascular: Normal rate, regular rhythm, S1 normal, S2 normal, normal heart sounds and intact distal pulses.  Exam reveals no gallop.   No murmur heard. Pulmonary/Chest: Effort normal and breath sounds normal. No respiratory distress. She has no wheezes. She has no rhonchi. She has no rales.  Abdominal: Soft. Normal appearance and bowel sounds are normal. She exhibits no distension, no fluid wave, no abdominal bruit and no mass. There is no hepatosplenomegaly. There is no tenderness. There is no rebound, no guarding and no CVA tenderness. A hernia is present.  Stable umbilical hernia  Lymphadenopathy:    She has no cervical adenopathy.    She has no axillary adenopathy.  Neurological: She is alert. She has normal strength. No cranial nerve deficit or sensory deficit.  Skin: Skin is warm, dry and intact. No rash noted.  Psychiatric: Her speech is normal and behavior is normal. Judgment normal. Her mood appears not anxious. Cognition and memory are normal. She does not exhibit a depressed mood.          Assessment & Plan:

## 2016-05-07 NOTE — Assessment & Plan Note (Signed)
Resolved

## 2016-05-07 NOTE — Patient Instructions (Addendum)
Please stop at the lab to set up to have labs drawn. Work on increasing exercise and decrease carbohydrates.

## 2016-05-07 NOTE — Progress Notes (Signed)
Pre visit review using our clinic review tool, if applicable. No additional management support is needed unless otherwise documented below in the visit note. 

## 2016-08-30 LAB — HM PAP SMEAR: HM Pap smear: NEGATIVE

## 2016-08-30 LAB — RESULTS CONSOLE HPV: CHL HPV: NEGATIVE

## 2016-10-25 ENCOUNTER — Ambulatory Visit (INDEPENDENT_AMBULATORY_CARE_PROVIDER_SITE_OTHER): Payer: Managed Care, Other (non HMO) | Admitting: Family Medicine

## 2016-10-25 ENCOUNTER — Encounter: Payer: Self-pay | Admitting: Family Medicine

## 2016-10-25 DIAGNOSIS — W57XXXA Bitten or stung by nonvenomous insect and other nonvenomous arthropods, initial encounter: Secondary | ICD-10-CM

## 2016-10-25 DIAGNOSIS — S90861A Insect bite (nonvenomous), right foot, initial encounter: Secondary | ICD-10-CM | POA: Diagnosis not present

## 2016-10-25 NOTE — Assessment & Plan Note (Signed)
Local allergic reaction.. Treat with topical steroids.  No sign of tick borne illness.

## 2016-10-25 NOTE — Patient Instructions (Addendum)
Local allergic reaction.. Treat with topical steroids as directed [id number]  twice daily  1-2 weeks.    Tick Bite Information, Adult Ticks are insects that draw blood for food. Most ticks live in shrubs and grassy areas. They climb onto people and animals that brush against the leaves and grasses that they rest on. Then they bite, attaching themselves to the skin. Most ticks are harmless, but some ticks carry germs that can spread to a person through a bite and cause a disease. To reduce your risk of getting a disease from a tick bite, it is important to take steps to prevent tick bites. It is also important to check for ticks after being outdoors. If you find that a tick has attached to you, watch for symptoms of disease. How can I prevent tick bites? Take these steps to help prevent tick bites when you are outdoors in an area where ticks are found:  Use insect repellent that has DEET (20% or higher), picaridin, or IR3535 in it. Use it on: ? Skin that is showing. ? The top of your boots. ? Your pant legs. ? Your sleeve cuffs.  For repellent products that contain permethrin, follow product instructions. Use these products on: ? Clothing. ? Gear. ? Boots. ? Tents.  Wear protective clothing. Long sleeves and long pants offer the best protection from ticks.  Wear light-colored clothing so you can see ticks more easily.  Tuck your pant legs into your socks.  If you go walking on a trail, stay in the middle of the trail so your skin, hair, and clothing do not touch the bushes.  Avoid walking through areas with long grass.  Check for ticks on your clothing, hair, and skin often while you are outside, and check again before you go inside. Make sure to check the places that ticks attach themselves most often. These places include the scalp, neck, armpits, waist, groin, and joint areas. Ticks that carry a disease called Lyme disease have to be attached to the skin for 24-48 hours. Checking  for ticks every day will lessen your risk of this and other diseases.  When you come indoors, wash your clothes and take a shower or a bath right away. Dry your clothes in a dryer on high heat for at least 60 minutes. This will kill any ticks in your clothes.  What is the proper way to remove a tick? If you find a tick on your body, remove it as soon as possible. Removing a tick sooner rather than later can prevent germs from passing from the tick to your body. To remove a tick that is crawling on your skin but has not bitten:  Go outdoors and brush the tick off.  Remove the tick with tape or a lint roller.  To remove a tick that is attached to your skin:  Wash your hands.  If you have latex gloves, put them on.  Use tweezers, curved forceps, or a tick-removal tool to gently grasp the tick as close to your skin and the tick's head as possible.  Gently pull with steady, upward pressure until the tick lets go. When removing the tick: ? Take care to keep the tick's head attached to its body. ? Do not twist or jerk the tick. This can make the tick's head or mouth break off. ? Do not squeeze or crush the tick's body. This could force disease-carrying fluids from the tick into your body.  Do not try to remove a  tick with heat, alcohol, petroleum jelly, or fingernail polish. Using these methods can cause the tick to salivate and regurgitate into your bloodstream, increasing your risk of getting a disease. What should I do after removing a tick?  Clean the bite area with soap and water, rubbing alcohol, or an iodine scrub.  If an antiseptic cream or ointment is available, apply a small amount to the bite site.  Wash and disinfect any instruments that you used to remove the tick. How should I dispose of a tick? To dispose of a live tick, use one of these methods:  Place it in rubbing alcohol.  Place it in a sealed bag or container.  Wrap it tightly in tape.  Flush it down the  toilet.  Contact a health care provider if:  You have symptoms of a disease after a tick bite. Symptoms of a tick-borne disease can occur from moments after the tick bites to up to 30 days after a tick is removed. Symptoms include: ? Muscle, joint, or bone pain. ? Difficulty walking or moving your legs. ? Numbness in the legs. ? Paralysis. ? Red rash around the tick bite area that is shaped like a target or a "bull's-eye." ? Redness and swelling in the area of the tick bite. ? Fever. ? Repeated vomiting. ? Diarrhea. ? Weight loss. ? Tender, swollen lymph glands. ? Shortness of breath. ? Cough. ? Pain in the abdomen. ? Headache. ? Abnormal tiredness. ? A change in your level of consciousness. ? Confusion. Get help right away if:  You are not able to remove a tick.  A part of a tick breaks off and gets stuck in your skin.  Your symptoms get worse. Summary  Ticks may carry germs that can spread to a person through a bite and cause disease.  Wear protective clothing and use insect repellent to prevent tick bites. Follow product instructions.  If you find a tick on your body, remove it as soon as possible. If the tick is attached, do not try to remove with heat, alcohol, petroleum jelly, or fingernail polish.  Remove the attached tick using tweezers, curved forceps, or a tick-removal tool. Gently pull with steady, upward pressure until the tick lets go. Do not twist or jerk the tick. Do not squeeze or crush the tick's body.  If you have symptoms after being bitten by a tick, contact a health care provider. This information is not intended to replace advice given to you by your health care provider. Make sure you discuss any questions you have with your health care provider. Document Released: 01/19/2000 Document Revised: 11/03/2015 Document Reviewed: 11/03/2015 Elsevier Interactive Patient Education  [employer].

## 2016-10-25 NOTE — Progress Notes (Signed)
   Subjective:    Patient ID: Terry Camacho, female    DOB: 1975-07-22, 41 y.o.   MRN: [id number]  HPI    41 year old female presents for tickt bite  she noted 2 weeks ago on  right dorsal foot. Noted itching in last week, burning.   She has put alcohol and peroxide on it.  No headache, no body ache, no fever,  No oint pain.  No other rash.   Blood pressure 100/64, pulse 71, temperature 98.5 F (36.9 C), height  (1.6 m), weight 166 lb (75.3 kg), SpO2 97 %.  j    Review of Systems  Constitutional: Negative for fatigue and fever.  HENT: Negative for ear pain.   Eyes: Negative for pain.  Respiratory: Negative for chest tightness and shortness of breath.   Cardiovascular: Negative for chest pain, palpitations and leg swelling.  Gastrointestinal: Negative for abdominal pain.  Genitourinary: Negative for dysuria.       Objective:   Physical Exam  Constitutional: Vital signs are normal. She appears well-developed and well-nourished. She is cooperative.  Non-toxic appearance. She does not appear ill. No distress.  HENT:  Head: Normocephalic.  Right Ear: Hearing, tympanic membrane, external ear and ear canal normal. Tympanic membrane is not erythematous, not retracted and not bulging.  Left Ear: Hearing, tympanic membrane, external ear and ear canal normal. Tympanic membrane is not erythematous, not retracted and not bulging.  Nose: No mucosal edema or rhinorrhea. Right sinus exhibits no maxillary sinus tenderness and no frontal sinus tenderness. Left sinus exhibits no maxillary sinus tenderness and no frontal sinus tenderness.  Mouth/Throat: Uvula is midline, oropharynx is clear and moist and mucous membranes are normal.  Eyes: Pupils are equal, round, and reactive to light. Conjunctivae, EOM and lids are normal. Lids are everted and swept, no foreign bodies found.  Neck: Trachea normal and normal range of motion. Neck supple. Carotid bruit is not present. No thyroid mass  and no thyromegaly present.  Cardiovascular: Normal rate, regular rhythm, S1 normal, S2 normal, normal heart sounds, intact distal pulses and normal pulses.  Exam reveals no gallop and no friction rub.   No murmur heard. Pulmonary/Chest: Effort normal and breath sounds normal. No tachypnea. No respiratory distress. She has no decreased breath sounds. She has no wheezes. She has no rhonchi. She has no rales.  Abdominal: Soft. Normal appearance and bowel sounds are normal. There is no tenderness.  Neurological: She is alert.  Skin: Skin is warm, dry and intact. No rash noted.  Right dorsal foot.. Hyperpigmented lesion 0.3 cm  no erythema, no abscess  Psychiatric: Her speech is normal and behavior is normal. Judgment and thought content normal. Her mood appears not anxious. Cognition and memory are normal. She does not exhibit a depressed mood.          Assessment & Plan:

## 2017-04-01 ENCOUNTER — Telehealth: Payer: Managed Care, Other (non HMO) | Admitting: Nurse Practitioner

## 2017-04-01 ENCOUNTER — Encounter: Payer: Self-pay | Admitting: Family Medicine

## 2017-04-01 DIAGNOSIS — J019 Acute sinusitis, unspecified: Secondary | ICD-10-CM

## 2017-04-01 MED ORDER — FLUTICASONE PROPIONATE 50 MCG/ACT NA SUSP: 2.0000 | Freq: Every day | NASAL | 0 refills | Status: DC

## 2017-04-01 MED ORDER — DOXYCYCLINE HYCLATE 100 MG PO TABS: 100.0000 mg | ORAL_TABLET | Freq: Two times a day (BID) | ORAL | 0 refills | Status: AC

## 2017-04-01 NOTE — Progress Notes (Signed)
We are sorry that you are not feeling well.  Here is how we plan to help!  Based on what you have shared with me it looks like you have sinusitis.  Sinusitis is inflammation and infection in the sinus cavities of the head.  Based on your presentation I believe you most likely have Acute Bacterial Sinusitis.This is an infection most likely caused by bacteria.  You may use an oral decongestant such as Mucinex D or if you have glaucoma or high blood pressure use plain Mucinex. Saline nasal spray help and can safely be used as often as needed for congestion, I have prescribed: Fluticasone nasal spray two sprays in each nostril once a day for 10 days and Doxycycline 100mg  twice daily for 10 days. Please be aware it may take 48-72 hours before experiencing a relief of your symptoms.  Some authorities believe that zinc sprays or the use of Echinacea may shorten the course of your symptoms.  Sinus infections are not as easily transmitted as other respiratory infection, however we still recommend that you avoid close contact with loved ones, especially the very young and elderly.  Remember to wash your hands thoroughly throughout the day as this is the number one way to prevent the spread of infection!  Home Care:  Only take medications as instructed by your medical team.  Do not take these medications with alcohol.  A steam or ultrasonic humidifier can help congestion.  You can place a towel over your head and breathe in the steam from hot water coming from a faucet.  Avoid close contacts especially the very young and the elderly.  Cover your mouth when you cough or sneeze.  Always remember to wash your hands.  Get Help Right Away If:  You develop worsening fever or sinus pain.  You develop a severe head ache or visual changes.  Your symptoms persist after you have completed your treatment plan.  Make sure you  Understand these instructions.  Will watch your condition.  Will get help  right away if you are not doing well or get worse.  Your e-visit answers were reviewed by a board certified advanced clinical practitioner to complete your personal care plan.  Depending on the condition, your plan could have included both over the counter or prescription medications.  If there is a problem please reply  once you have received a response from your provider.  Your safety is important to us.  If you have drug allergies check your prescription carefully.    You can use MyChart to ask questions about today's visit, request a non-urgent call back, or ask for a work or school excuse for 24 hours related to this e-Visit. If it has been greater than 24 hours you will need to follow up with your provider, or enter a new e-Visit to address those concerns.  You will get an e-mail in the next two days asking about your experience.  I hope that your e-visit has been valuable and will speed your recovery. Thank you for using e-visits.

## 2017-12-11 LAB — HM MAMMOGRAPHY

## 2017-12-11 LAB — HM PAP SMEAR: HM Pap smear: NEGATIVE

## 2018-03-06 ENCOUNTER — Other Ambulatory Visit (INDEPENDENT_AMBULATORY_CARE_PROVIDER_SITE_OTHER): Payer: Managed Care, Other (non HMO)

## 2018-03-06 ENCOUNTER — Telehealth: Payer: Self-pay | Admitting: Family Medicine

## 2018-03-06 DIAGNOSIS — Z13220 Encounter for screening for lipoid disorders: Secondary | ICD-10-CM | POA: Diagnosis not present

## 2018-03-06 LAB — COMPREHENSIVE METABOLIC PANEL
ALT: 8 U/L (ref 0–35)
AST: 13 U/L (ref 0–37)
Albumin: 4.3 g/dL (ref 3.5–5.2)
Alkaline Phosphatase: 53 U/L (ref 39–117)
BUN: 9 mg/dL (ref 6–23)
CHLORIDE: 104 meq/L (ref 96–112)
CO2: 26 meq/L (ref 19–32)
CREATININE: 1.01 mg/dL (ref 0.40–1.20)
Calcium: 9 mg/dL (ref 8.4–10.5)
GFR: 72.98 mL/min (ref >60.00)
Glucose, Bld: 94 mg/dL (ref 70–99)
Potassium: 4.1 mEq/L (ref 3.5–5.1)
SODIUM: 137 meq/L (ref 135–145)
Total Bilirubin: 0.8 mg/dL (ref 0.2–1.2)
Total Protein: 6.5 g/dL (ref 6.0–8.3)

## 2018-03-06 LAB — LIPID PANEL
CHOL/HDL RATIO: 3
Cholesterol: 150 mg/dL (ref 0–200)
HDL: 49.1 mg/dL (ref >39.00)
LDL CALC: 92 mg/dL (ref 0–99)
NonHDL: 101.07
TRIGLYCERIDES: 47 mg/dL (ref 0.0–149.0)
VLDL: 9.4 mg/dL (ref 0.0–40.0)

## 2018-03-06 NOTE — Telephone Encounter (Signed)
-----   Message from Alvina Chouerri J Walsh sent at 02/23/2018  9:09 AM EST ----- Regarding: Lab orders for Friday, 1.31.20 Patient is scheduled for CPX labs, please order future labs, Thanks , Camelia Engerri

## 2018-03-13 ENCOUNTER — Encounter: Payer: Managed Care, Other (non HMO) | Admitting: Family Medicine

## 2018-03-19 ENCOUNTER — Encounter: Payer: Self-pay | Admitting: Family Medicine

## 2018-03-19 ENCOUNTER — Ambulatory Visit (INDEPENDENT_AMBULATORY_CARE_PROVIDER_SITE_OTHER): Payer: Managed Care, Other (non HMO) | Admitting: Family Medicine

## 2018-03-19 VITALS — BP 116/70 | HR 81 | Temp 98.0°F | Ht 62.25 in | Wt 170.2 lb

## 2018-03-19 DIAGNOSIS — Z0000 Encounter for general adult medical examination without abnormal findings: Secondary | ICD-10-CM | POA: Diagnosis not present

## 2018-03-19 DIAGNOSIS — Z8349 Family history of other endocrine, nutritional and metabolic diseases: Secondary | ICD-10-CM | POA: Insufficient documentation

## 2018-03-19 DIAGNOSIS — R5383 Other fatigue: Secondary | ICD-10-CM

## 2018-03-19 MED ORDER — FLUTICASONE PROPIONATE 50 MCG/ACT NA SUSP: 2.0000 | Freq: Every day | NASAL | 11 refills | Status: DC

## 2018-03-19 NOTE — Patient Instructions (Signed)
Please stop at the lab to have labs drawn.  

## 2018-03-19 NOTE — Progress Notes (Signed)
Subjective:    Patient ID: Terry PlummerSheila Camacho, female    DOB: 1975-09-03, 43 y.o.   MRN: [id number]  HPI  The patient is here for annual wellness exam and preventative care.     Obesity  Wt Readings from Last 3 Encounters:  03/19/18 170 lb 4 oz (77.2 kg)  10/25/16 166 lb (75.3 kg)  05/07/16 173 lb 8 oz (78.7 kg)  Body mass index is 30.89 kg/m.  Reviewed labs in detail. Diet: healthy diet Exercise: 4-5 daily.. cardio 20-25 min, and strength training.  Mother and brother with thyroid issue. Social History /Family History/Past Medical History reviewed in detail and updated in EMR if needed. Blood pressure 116/70, pulse 81, temperature 98 F (36.7 C), temperature source Oral, height 5' 2.25" (1.581 m), weight 170 lb 4 oz (77.2 kg), SpO2 98 %.  Review of Systems  Constitutional: Positive for fatigue. Negative for fever.       But does not put aside more than 5 hours to sleep at night.  HENT: Positive for congestion.   Eyes: Negative for pain.  Respiratory: Negative for cough and shortness of breath.   Cardiovascular: Negative for chest pain, palpitations and leg swelling.  Gastrointestinal: Negative for abdominal pain.  Genitourinary: Negative for dysuria and vaginal bleeding.  Musculoskeletal: Negative for back pain.  Neurological: Negative for syncope, light-headedness and headaches.  Psychiatric/Behavioral: Negative for dysphoric mood.       Objective:   Physical Exam Constitutional:      General: She is not in acute distress.    Appearance: Normal appearance. She is well-developed. She is not ill-appearing or toxic-appearing.  HENT:     Head: Normocephalic.     Right Ear: Hearing, tympanic membrane, ear canal and external ear normal.     Left Ear: Hearing, tympanic membrane, ear canal and external ear normal.     Nose: Nose normal.  Eyes:     General: Lids are normal. Lids are everted, no foreign bodies appreciated.     Conjunctiva/sclera: Conjunctivae normal.   Pupils: Pupils are equal, round, and reactive to light.  Neck:     Musculoskeletal: Normal range of motion and neck supple.     Thyroid: No thyroid mass or thyromegaly.     Vascular: No carotid bruit.     Trachea: Trachea normal.  Cardiovascular:     Rate and Rhythm: Normal rate and regular rhythm.     Heart sounds: Normal heart sounds, S1 normal and S2 normal. No murmur. No gallop.   Pulmonary:     Effort: Pulmonary effort is normal. No respiratory distress.     Breath sounds: Normal breath sounds. No wheezing, rhonchi or rales.  Abdominal:     General: Bowel sounds are normal. There is no distension or abdominal bruit.     Palpations: Abdomen is soft. There is no fluid wave or mass.     Tenderness: There is no abdominal tenderness. There is no guarding or rebound.     Hernia: No hernia is present.  Lymphadenopathy:     Cervical: No cervical adenopathy.  Skin:    General: Skin is warm and dry.     Findings: No rash.  Neurological:     Mental Status: She is alert.     Cranial Nerves: No cranial nerve deficit.     Sensory: No sensory deficit.  Psychiatric:        Mood and Affect: Mood is not anxious or depressed.        Speech: Speech  normal.        Behavior: Behavior normal. Behavior is cooperative.        Judgment: Judgment normal.           Assessment & Plan:  The patient's preventative maintenance and recommended screening tests for an annual wellness exam were reviewed in full today. Brought up to date unless services declined.  Counselled on the importance of diet, exercise, and its role in overall health and mortality. The patient's FH and SH was reviewed, including their home life, tobacco status, and drug and alcohol status.   Vaccines: Due for Td  After 04/2018 Pap/DVE:   Has IUD, last pap 11/20... will get records  Mammo:   2019.. will get records Colon: no early family history of colon cancer Smoking Status:nonsmoker ETOH/ drug use: none/none   HIV screen:    Has had in past.  Denies need for STD screening in addition.

## 2018-03-20 LAB — CBC WITH DIFFERENTIAL/PLATELET
Basophils Absolute: 0.1 10*3/uL (ref 0.0–0.1)
Basophils Relative: 1.6 % (ref 0.0–3.0)
EOS ABS: 0.2 10*3/uL (ref 0.0–0.7)
Eosinophils Relative: 2.6 % (ref 0.0–5.0)
HCT: 40 % (ref 36.0–46.0)
Hemoglobin: 13 g/dL (ref 12.0–15.0)
Lymphocytes Relative: 43.9 % (ref 12.0–46.0)
Lymphs Abs: 3 10*3/uL (ref 0.7–4.0)
MCHC: 32.6 g/dL (ref 30.0–36.0)
MCV: 79.6 fl (ref 78.0–100.0)
Monocytes Absolute: 0.6 10*3/uL (ref 0.1–1.0)
Monocytes Relative: 9.4 % (ref 3.0–12.0)
Neutro Abs: 2.9 10*3/uL (ref 1.4–7.7)
Neutrophils Relative %: 42.5 % — ABNORMAL LOW (ref 43.0–77.0)
Platelets: 253 10*3/uL (ref 150.0–400.0)
RBC: 5.03 Mil/uL (ref 3.87–5.11)
RDW: 12.9 % (ref 11.5–15.5)
WBC: 6.8 10*3/uL (ref 4.0–10.5)

## 2018-03-20 LAB — T3, FREE: T3 FREE: 2.9 pg/mL (ref 2.3–4.2)

## 2018-03-20 LAB — IBC + FERRITIN
Ferritin: 135.3 ng/mL (ref 10.0–291.0)
Iron: 54 ug/dL (ref 42–145)
SATURATION RATIOS: 18.7 % — AB (ref 20.0–50.0)
Transferrin: 206 mg/dL — ABNORMAL LOW (ref 212.0–360.0)

## 2018-03-20 LAB — TSH: TSH: 0.89 u[IU]/mL (ref 0.35–4.50)

## 2018-03-20 LAB — T4, FREE: Free T4: 0.77 ng/dL (ref 0.60–1.60)

## 2018-03-20 LAB — VITAMIN B12: Vitamin B-12: 618 pg/mL (ref 211–911)

## 2018-04-01 ENCOUNTER — Encounter: Payer: Self-pay | Admitting: Family Medicine

## 2018-04-03 ENCOUNTER — Encounter: Payer: Self-pay | Admitting: Primary Care

## 2018-04-03 ENCOUNTER — Ambulatory Visit: Payer: Managed Care, Other (non HMO) | Admitting: Primary Care

## 2018-04-03 VITALS — BP 116/74 | HR 77 | Temp 98.3°F | Ht 62.25 in | Wt 170.5 lb

## 2018-04-03 DIAGNOSIS — H6123 Impacted cerumen, bilateral: Secondary | ICD-10-CM | POA: Diagnosis not present

## 2018-04-03 DIAGNOSIS — H9201 Otalgia, right ear: Secondary | ICD-10-CM

## 2018-04-03 NOTE — Patient Instructions (Signed)
You have a lot of ear wax buildup in your ear. Try using Debrox Ear Wax drops/kit. This can be purchased over the counter.   Continue Flonase and Zyrtec or Claritin.  Please follow up with Dr. Bedsole or myself if your symptoms persist.   It was a pleasure meeting you!   Earwax Buildup, Adult The ears produce a substance called earwax that helps keep bacteria out of the ear and protects the skin in the ear canal. Occasionally, earwax can build up in the ear and cause discomfort or hearing loss. What increases the risk? This condition is more likely to develop in people who:  Are female.  Are elderly.  Naturally produce more earwax.  Clean their ears often with cotton swabs.  Use earplugs often.  Use in-ear headphones often.  Wear hearing aids.  Have narrow ear canals.  Have earwax that is overly thick or sticky.  Have eczema.  Are dehydrated.  Have excess hair in the ear canal. What are the signs or symptoms? Symptoms of this condition include:  Reduced or muffled hearing.  A feeling of fullness in the ear or feeling that the ear is plugged.  Fluid coming from the ear.  Ear pain.  Ear itch.  Ringing in the ear.  Coughing.  An obvious piece of earwax that can be seen inside the ear canal. How is this diagnosed? This condition may be diagnosed based on:  Your symptoms.  Your medical history.  An ear exam. During the exam, your health care provider will look into your ear with an instrument called an otoscope. You may have tests, including a hearing test. How is this treated? This condition may be treated by:  Using ear drops to soften the earwax.  Having the earwax removed by a health care provider. The health care provider may: ? Flush the ear with water. ? Use an instrument that has a loop on the end (curette). ? Use a suction device.  Surgery to remove the wax buildup. This may be done in severe cases. Follow these instructions at  home:   Take over-the-counter and prescription medicines only as told by your health care provider.  Do not put any objects, including cotton swabs, into your ear. You can clean the opening of your ear canal with a washcloth or facial tissue.  Follow instructions from your health care provider about cleaning your ears. Do not over-clean your ears.  Drink enough fluid to keep your urine clear or pale yellow. This will help to thin the earwax.  Keep all follow-up visits as told by your health care provider. If earwax builds up in your ears often or if you use hearing aids, consider seeing your health care provider for routine, preventive ear cleanings. Ask your health care provider how often you should schedule your cleanings.  If you have hearing aids, clean them according to instructions from the manufacturer and your health care provider. Contact a health care provider if:  You have ear pain.  You develop a fever.  You have blood, pus, or other fluid coming from your ear.  You have hearing loss.  You have ringing in your ears that does not go away.  Your symptoms do not improve with treatment.  You feel like the room is spinning (vertigo). Summary  Earwax can build up in the ear and cause discomfort or hearing loss.  The most common symptoms of this condition include reduced or muffled hearing and a feeling of fullness in the   ear or feeling that the ear is plugged.  This condition may be diagnosed based on your symptoms, your medical history, and an ear exam.  This condition may be treated by using ear drops to soften the earwax or by having the earwax removed by a health care provider.  Do not put any objects, including cotton swabs, into your ear. You can clean the opening of your ear canal with a washcloth or facial tissue. This information is not intended to replace advice given to you by your health care provider. Make sure you discuss any questions you have with your  health care provider. Document Released: 02/29/2004 Document Revised: 01/02/2017 Document Reviewed: 04/03/2016 Elsevier Interactive Patient Education  2019 [employer].

## 2018-04-03 NOTE — Progress Notes (Signed)
Subjective:    Patient ID: Terry Camacho, female    DOB: 1975/04/17, 43 y.o.   MRN: [id number]  HPI  Terry Camacho is a 43 year old female who presents today with a chief complaint of otalgia.   She also reports sinus drainage, popping sensations to the ear. Her symptoms are located to the right ear which began one month ago. She's taken Flonase, Claritin, Zyrtec without much improvement. She denies fevers, chills, cough.   Review of Systems  Constitutional: Negative for fever.  HENT: Positive for ear pain and postnasal drip. Negative for sore throat.   Respiratory: Negative for cough.        Past Medical History:  Diagnosis Date  . GERD (gastroesophageal reflux disease)   . Hiatal hernia   . Palpitations 11/18/2014     Social History   Socioeconomic History  . Marital status: Married    Spouse name: Not on file  . Number of children: 2  . Years of education: Not on file  . Highest education level: Not on file  Occupational History  . Occupation: Research scientist (physical sciences): [employer]  . Financial resource strain: Not on file  . Food insecurity:    Worry: Not on file    Inability: Not on file  . Transportation needs:    Medical: Not on file    Non-medical: Not on file  Tobacco Use  . Smoking status: Never Smoker  . Smokeless tobacco: Never Used  Substance and Sexual Activity  . Alcohol use: No    Comment: Rarely  . Drug use: No  . Sexual activity: Yes    Birth control/protection: I.U.D.  Lifestyle  . Physical activity:    Days per week: Not on file    Minutes per session: Not on file  . Stress: Not on file  Relationships  . Social connections:    Talks on phone: Not on file    Gets together: Not on file    Attends religious service: Not on file    Active member of club or organization: Not on file    Attends meetings of clubs or organizations: Not on file    Relationship status: Not on file  . Intimate partner violence:    Fear  of current or ex partner: Not on file    Emotionally abused: Not on file    Physically abused: Not on file    Forced sexual activity: Not on file  Other Topics Concern  . Not on file  Social History Narrative   Occupation:  She is a Marketing executive at [employer].   Married - 2 children    Never Smoked   Alcohol use-yes (seldom)        Past Surgical History:  Procedure Laterality Date  . ESOPHAGOGASTRODUODENOSCOPY  10/14/2005   esophagitis    Family History  Problem Relation Age of Onset  . Hypertension Mother 70  . Other Father 16       healty  . Liver cancer Maternal Uncle   . Stomach cancer Maternal Aunt   . Heart disease Brother        valve issue  . Colon cancer Neg Hx     Allergies  Allergen Reactions  . Amoxicillin     REACTION: Hives    Current Outpatient Medications on File Prior to Visit  Medication Sig Dispense Refill  . fluticasone (FLONASE) 50 MCG/ACT nasal spray Place 2 sprays into both nostrils daily.  1 g 11  . levonorgestrel (MIRENA) 20 MCG/24HR IUD 1 each by Intrauterine route once.     No current facility-administered medications on file prior to visit.     BP 116/74   Pulse 77   Temp 98.3 F (36.8 C) (Oral)   Ht 5' 2.25" (1.581 m)   Wt 170 lb 8 oz (77.3 kg)   SpO2 98%   BMI 30.93 kg/m    Objective:   Physical Exam  Constitutional: She appears well-nourished. She does not appear ill.  HENT:  Right Ear: Tympanic membrane and ear canal normal.  Left Ear: Tympanic membrane and ear canal normal.  Nose: No mucosal edema. Right sinus exhibits no maxillary sinus tenderness and no frontal sinus tenderness. Left sinus exhibits no maxillary sinus tenderness and no frontal sinus tenderness.  Mouth/Throat: Oropharynx is clear and moist.  Bilateral canals with cerumen impaction. Right TM post irrigation unremarkable. Unable to visualize left TM due to deep cerumen impaction unable to get with irrigation. Mild irritation to left canal.    Neck: Neck supple.  Cardiovascular: Normal rate and regular rhythm.  Respiratory: Effort normal and breath sounds normal. She has no wheezes.  Skin: Skin is warm and dry.           Assessment & Plan:  Otalgia:  To right ear x 1 month. Exam today with bilateral cerumen impaction. Patient agreed to irrigation and tolerated well.  Do suspect symptoms to be secondary to deep cerumen impaction, she does use q-tips and we discussed to stop. Respiratory exam unremarkable otherwise. Discussed use of Debrox Ear Wax kit OTC. Continue Flonase and antihistamine.  She will update if symptoms do not improve. Return precautions provided.  Pleas Koch, NP

## 2018-04-07 ENCOUNTER — Ambulatory Visit: Payer: Managed Care, Other (non HMO) | Admitting: Family Medicine

## 2019-01-14 LAB — HM PAP SMEAR: HM Pap smear: NEGATIVE

## 2019-01-21 LAB — HM MAMMOGRAPHY

## 2019-02-19 ENCOUNTER — Encounter: Payer: Self-pay | Admitting: Intensive Care

## 2019-02-19 ENCOUNTER — Telehealth: Payer: Self-pay

## 2019-02-19 ENCOUNTER — Other Ambulatory Visit: Payer: Self-pay

## 2019-02-19 ENCOUNTER — Emergency Department
Admission: EM | Admit: 2019-02-19 | Discharge: 2019-02-19 | Disposition: A | Discharge status: Home / Self Care | Payer: Managed Care, Other (non HMO) | Attending: Emergency Medicine | Admitting: Emergency Medicine

## 2019-02-19 ENCOUNTER — Emergency Department: Payer: Managed Care, Other (non HMO)

## 2019-02-19 DIAGNOSIS — Z79899 Other long term (current) drug therapy: Secondary | ICD-10-CM | POA: Insufficient documentation

## 2019-02-19 DIAGNOSIS — R0789 Other chest pain: Secondary | ICD-10-CM | POA: Diagnosis present

## 2019-02-19 LAB — CBC
HCT: 41.3 % (ref 36.0–46.0)
Hemoglobin: 13.1 g/dL (ref 12.0–15.0)
MCH: 26.2 pg (ref 26.0–34.0)
MCHC: 31.7 g/dL (ref 30.0–36.0)
MCV: 82.6 fL (ref 80.0–100.0)
Platelets: 204 10*3/uL (ref 150–400)
RBC: 5 MIL/uL (ref 3.87–5.11)
RDW: 12.6 % (ref 11.5–15.5)
WBC: 5.4 10*3/uL (ref 4.0–10.5)
nRBC: 0 % (ref 0.0–0.2)

## 2019-02-19 LAB — BASIC METABOLIC PANEL
Anion gap: 7 (ref 5–15)
BUN: 14 mg/dL (ref 6–20)
CO2: 26 mmol/L (ref 22–32)
Calcium: 8.8 mg/dL — ABNORMAL LOW (ref 8.9–10.3)
Chloride: 105 mmol/L (ref 98–111)
Creatinine, Ser: 0.97 mg/dL (ref 0.44–1.00)
GFR calc Af Amer: >60 mL/min (ref >60)
GFR calc non Af Amer: >60 mL/min (ref >60)
Glucose, Bld: 78 mg/dL (ref 70–99)
Potassium: 4.2 mmol/L (ref 3.5–5.1)
Sodium: 138 mmol/L (ref 135–145)

## 2019-02-19 LAB — TROPONIN I (HIGH SENSITIVITY): Troponin I (High Sensitivity): <2 ng/L (ref <18)

## 2019-02-19 NOTE — ED Triage Notes (Signed)
Patient c/o central/right sided chest pain that radiates to back and right neck.

## 2019-02-19 NOTE — ED Provider Notes (Signed)
[facility] Emergency Department Provider Note  ____________________________________________   I have reviewed the triage vital signs and the nursing notes.   HISTORY  Chief Complaint Chest Pain   History limited by: Not Limited   HPI Terry Camacho is a 44 y.o. female who presents to the emergency department today because of concerns for chest pain.  Is located in her right chest.  The pain started around 3 AM.  Patient denied any unusual lifting or activity yesterday.  She states the pain is worse with movement. Has had some intermittent numbness to her right hand.  She has not had any shortness of breath or diaphoresis.  Denies any nausea or vomiting.  Denies similar pain in the past.  No fevers.  No recent trauma.  Records reviewed. Per medical record review patient has a history of GERD.   Past Medical History:  Diagnosis Date  . GERD (gastroesophageal reflux disease)   . Hiatal hernia   . Palpitations 11/18/2014    Patient Active Problem List   Diagnosis Date Noted  . Family history of thyroid disease 03/19/2018  . Fatigue 03/19/2018  . Tick bite of right foot 10/25/2016  . History of palpitations 11/18/2014  . Umbilical hernia 25/42/7062  . GERD 04/28/2007    Past Surgical History:  Procedure Laterality Date  . ESOPHAGOGASTRODUODENOSCOPY  10/14/2005   esophagitis    Prior to Admission medications   Medication Sig Start Date End Date Taking? Authorizing Provider  fluticasone (FLONASE) 50 MCG/ACT nasal spray Place 2 sprays into both nostrils daily. 03/19/18   Jinny Sanders, MD  levonorgestrel (MIRENA) 20 MCG/24HR IUD 1 each by Intrauterine route once.    [provider]    Allergies Amoxicillin  Family History  Problem Relation Age of Onset  . Hypertension Mother 82  . Other Father 51       healty  . Liver cancer Maternal Uncle   . Stomach cancer Maternal Aunt   . Heart disease Brother        valve issue  . Colon  cancer Neg Hx     Social History Social History   Tobacco Use  . Smoking status: Never Smoker  . Smokeless tobacco: Never Used  Substance Use Topics  . Alcohol use: Yes    Comment: Rarely  . Drug use: No    Review of Systems Constitutional: No fever/chills Eyes: No visual changes. ENT: No sore throat. Cardiovascular: Positive for right chest pain.  Respiratory: Denies shortness of breath. Gastrointestinal: No abdominal pain.  No nausea, no vomiting.  No diarrhea.   Genitourinary: Negative for dysuria. Musculoskeletal: Negative for back pain. Skin: Negative for rash. Neurological: Positive for intermittent numbness to the right arm.  ____________________________________________   PHYSICAL EXAM:  VITAL SIGNS: ED Triage Vitals  Enc Vitals Group     BP 02/19/19 1642 127/84     Pulse Rate 02/19/19 1642 71     Resp 02/19/19 1642 16     Temp 02/19/19 1642 98.5 F (36.9 C)     Temp Source 02/19/19 1642 Oral     SpO2 02/19/19 1642 100 %     Weight 02/19/19 1643 164 lb (74.4 kg)     Height 02/19/19 1643 5\' 2"  (1.575 m)     Head Circumference --      Peak Flow --      Pain Score 02/19/19 1659 7   Constitutional: Alert and oriented.  Eyes: Conjunctivae are normal.  ENT  Head: Normocephalic and atraumatic.      Nose: No congestion/rhinnorhea.      Mouth/Throat: Mucous membranes are moist.      Neck: No stridor. Hematological/Lymphatic/Immunilogical: No cervical lymphadenopathy. Cardiovascular: Normal rate, regular rhythm.  No murmurs, rubs, or gallops.  Respiratory: Normal respiratory effort without tachypnea nor retractions. Breath sounds are clear and equal bilaterally. No wheezes/rales/rhonchi. Gastrointestinal: Soft and non tender. No rebound. No guarding.  Genitourinary: Deferred Musculoskeletal: Normal range of motion in all extremities. No lower extremity edema.Tender to palpation over the right chest, reproduces the pain patient was experiencing.   Neurologic:  Normal speech and language. No gross focal neurologic deficits are appreciated.  Skin:  Skin is warm, dry and intact. No rash noted. Psychiatric: Mood and affect are normal. Speech and behavior are normal. Patient exhibits appropriate insight and judgment.  ____________________________________________    LABS (pertinent positives/negatives)  CBC wbc 5.4, hgb 13.1, plt 204 Trop hs <2 BMP wnl except ca 8.8 ____________________________________________   EKG  I, Phineas Semen, attending physician, personally viewed and interpreted this EKG  EKG Time: 1645 Rate: 78 Rhythm: normal sinus rhythm Axis: normal Intervals: qtc 412 QRS: narrow ST changes: no st elevation Impression: normal ekg  ____________________________________________    RADIOLOGY  CXR Negative ____________________________________________   PROCEDURES  Procedures  ____________________________________________   INITIAL IMPRESSION / ASSESSMENT AND PLAN / ED COURSE  Pertinent labs & imaging results that were available during my care of the patient were reviewed by me and considered in my medical decision making (see chart for details).   Patient presented to the emergency department today because of concerns for chest pain.  Pain is located in the right side of her chest and is worse with movements.  Patient is tender to palpation on exam.  Work-up without concerning elevation of troponin.  Chest x-ray and EKG without concerning findings.  This point given reproducibility of the pain I do think musculoskeletal cause likely.  I discussed this with the patient.  Discussed return precautions.  ____________________________________________   FINAL CLINICAL IMPRESSION(S) / ED DIAGNOSES  Final diagnoses:  Chest wall pain     Note: This dictation was prepared with Dragon dictation. Any transcriptional errors that result from this process are unintentional     Phineas Semen,  MD 02/19/19 1930

## 2019-02-19 NOTE — Discharge Instructions (Addendum)
Please seek medical attention for any high fevers, chest pain, shortness of breath, change in behavior, persistent vomiting, bloody stool or any other new or concerning symptoms.  

## 2019-02-19 NOTE — Telephone Encounter (Signed)
Agree with dispo to ER.

## 2019-02-19 NOTE — ED Notes (Signed)
Pt verbalized discharge instructions and has no questions at this time 

## 2019-02-19 NOTE — Telephone Encounter (Signed)
Pt said this morning at 3 AM was urinating and when washing her hands had "bad " chest pain on rt side of chest. Pain eased some and pt went back to sleep; pt worked today and has constant dull pain upper rt chest which radiated to rt neck. On the way home from work pt noticed numbness in rt hand and lower rt arm. Pt declined 911. pts husband is taking her to [facility] ED now. Pt has no SOB or H/Aor dizziness. Pt has never had this type pain before. FYI to Dr Ermalene Searing.

## 2019-03-26 ENCOUNTER — Telehealth: Payer: Self-pay | Admitting: Family Medicine

## 2019-03-26 DIAGNOSIS — Z13220 Encounter for screening for lipoid disorders: Secondary | ICD-10-CM

## 2019-03-26 NOTE — Telephone Encounter (Signed)
-----   Message from Alvina Chou sent at 03/17/2019  9:43 AM EST ----- Regarding: Lab orders for Monday, 2.22.21 Patient is scheduled for CPX labs, please order future labs, Thanks , Camelia Eng

## 2019-03-29 ENCOUNTER — Other Ambulatory Visit (INDEPENDENT_AMBULATORY_CARE_PROVIDER_SITE_OTHER): Payer: Managed Care, Other (non HMO)

## 2019-03-29 ENCOUNTER — Other Ambulatory Visit: Payer: Self-pay

## 2019-03-29 DIAGNOSIS — Z13220 Encounter for screening for lipoid disorders: Secondary | ICD-10-CM | POA: Diagnosis not present

## 2019-03-29 LAB — COMPREHENSIVE METABOLIC PANEL
ALT: 10 U/L (ref 0–35)
AST: 17 U/L (ref 0–37)
Albumin: 4.5 g/dL (ref 3.5–5.2)
Alkaline Phosphatase: 53 U/L (ref 39–117)
BUN: 11 mg/dL (ref 6–23)
CO2: 27 mEq/L (ref 19–32)
Calcium: 8.7 mg/dL (ref 8.4–10.5)
Chloride: 106 mEq/L (ref 96–112)
Creatinine, Ser: 1.07 mg/dL (ref 0.40–1.20)
GFR: 67.93 mL/min (ref >60.00)
Glucose, Bld: 97 mg/dL (ref 70–99)
Potassium: 3.9 mEq/L (ref 3.5–5.1)
Sodium: 139 mEq/L (ref 135–145)
Total Bilirubin: 0.7 mg/dL (ref 0.2–1.2)
Total Protein: 6.9 g/dL (ref 6.0–8.3)

## 2019-03-29 LAB — LIPID PANEL
Cholesterol: 146 mg/dL (ref 0–200)
HDL: 56.5 mg/dL (ref >39.00)
LDL Cholesterol: 80 mg/dL (ref 0–99)
NonHDL: 89.93
Total CHOL/HDL Ratio: 3
Triglycerides: 50 mg/dL (ref 0.0–149.0)
VLDL: 10 mg/dL (ref 0.0–40.0)

## 2019-03-30 NOTE — Progress Notes (Signed)
No critical labs need to be addressed urgently. We will discuss labs in detail at upcoming office visit.   

## 2019-04-02 ENCOUNTER — Ambulatory Visit (INDEPENDENT_AMBULATORY_CARE_PROVIDER_SITE_OTHER): Payer: Managed Care, Other (non HMO) | Admitting: Family Medicine

## 2019-04-02 ENCOUNTER — Other Ambulatory Visit: Payer: Self-pay

## 2019-04-02 ENCOUNTER — Encounter: Payer: Self-pay | Admitting: Family Medicine

## 2019-04-02 VITALS — BP 100/66 | HR 82 | Temp 98.7°F | Ht 62.25 in | Wt 169.5 lb

## 2019-04-02 DIAGNOSIS — F331 Major depressive disorder, recurrent, moderate: Secondary | ICD-10-CM | POA: Insufficient documentation

## 2019-04-02 DIAGNOSIS — Z0000 Encounter for general adult medical examination without abnormal findings: Secondary | ICD-10-CM

## 2019-04-02 DIAGNOSIS — F320 Major depressive disorder, single episode, mild: Secondary | ICD-10-CM

## 2019-04-02 DIAGNOSIS — F3342 Major depressive disorder, recurrent, in full remission: Secondary | ICD-10-CM | POA: Insufficient documentation

## 2019-04-02 DIAGNOSIS — Z23 Encounter for immunization: Secondary | ICD-10-CM

## 2019-04-02 DIAGNOSIS — F411 Generalized anxiety disorder: Secondary | ICD-10-CM | POA: Diagnosis not present

## 2019-04-02 DIAGNOSIS — Z8349 Family history of other endocrine, nutritional and metabolic diseases: Secondary | ICD-10-CM

## 2019-04-02 MED ORDER — SERTRALINE HCL 50 MG PO TABS: 50.0000 mg | ORAL_TABLET | Freq: Every day | ORAL | 5 refills | Status: DC

## 2019-04-02 NOTE — Addendum Note (Signed)
Addended by: Alvina Chou on: 04/02/2019 03:39 PM   Modules accepted: Orders

## 2019-04-02 NOTE — Progress Notes (Signed)
Chief Complaint  Patient presents with  . Annual Exam    History of Present Illness: HPI   The patient is here for annual wellness exam and preventative care.     She has been having issues with her mood during [place]. Mainly in last 6 months.  She has been more stressed out. Not able to exercsie at gym.  Decreased motivation, focus.  Some excessive worry, more anxious. No panic attacks.  Worse in last several weeks... more depression, sadness, irritable.  No sleep issues.  PHQ9: 17/30   No family history.    Obesity  Wt Readings from Last 3 Encounters:  04/02/19 169 lb 8 oz (76.9 kg)  02/19/19 164 lb (74.4 kg)  04/03/18 170 lb 8 oz (77.3 kg)    Body mass index is 30.75 kg/m.  Diet: Taking nutrition class. Exercise: 2 times a week.   Reviewed labs in detail. Lab Results  Component Value Date   CHOL 146 03/29/2019   HDL 56.50 03/29/2019   LDLCALC 80 03/29/2019   TRIG 50.0 03/29/2019   CHOLHDL 3 03/29/2019     This visit occurred during the SARS-CoV-2 public health emergency.  Safety protocols were in place, including screening questions prior to the visit, additional usage of staff PPE, and extensive cleaning of exam room while observing appropriate contact time as indicated for disinfecting solutions.   COVID 19 screen:  No recent travel or known exposure to COVID19 The patient denies respiratory symptoms of COVID 19 at this time. The importance of social distancing was discussed today.     Review of Systems  Constitutional: Negative for chills and fever.  HENT: Negative for congestion and ear pain.   Eyes: Negative for pain and redness.  Respiratory: Negative for cough and shortness of breath.   Cardiovascular: Negative for chest pain, palpitations and leg swelling.  Gastrointestinal: Negative for abdominal pain, blood in stool, constipation, diarrhea, nausea and vomiting.  Genitourinary: Negative for dysuria.  Musculoskeletal: Negative for falls and  myalgias.  Skin: Negative for rash.  Neurological: Negative for dizziness.  Psychiatric/Behavioral: Positive for depression. Negative for suicidal ideas. The patient is nervous/anxious. The patient does not have insomnia.       Past Medical History:  Diagnosis Date  . GERD (gastroesophageal reflux disease)   . Hiatal hernia   . Palpitations 11/18/2014    reports that she has never smoked. She has never used smokeless tobacco. She reports current alcohol use. She reports that she does not use drugs.   Current Outpatient Medications:  .  fluticasone (FLONASE) 50 MCG/ACT nasal spray, Place 2 sprays into both nostrils daily., Disp: 1 g, Rfl: 11 .  levonorgestrel (MIRENA) 20 MCG/24HR IUD, 1 each by Intrauterine route once., Disp: , Rfl:    Observations/Objective: Blood pressure 100/66, pulse 82, temperature 98.7 F (37.1 C), temperature source Temporal, height 5' 2.25" (1.581 m), weight 169 lb 8 oz (76.9 kg), SpO2 97 %.  Physical Exam Constitutional:      General: She is not in acute distress.    Appearance: Normal appearance. She is well-developed. She is obese. She is not ill-appearing or toxic-appearing.  HENT:     Head: Normocephalic.     Right Ear: Hearing, tympanic membrane, ear canal and external ear normal.     Left Ear: Hearing, tympanic membrane, ear canal and external ear normal.     Nose: Nose normal.  Eyes:     General: Lids are normal. Lids are everted, no foreign bodies appreciated.  Conjunctiva/sclera: Conjunctivae normal.     Pupils: Pupils are equal, round, and reactive to light.  Neck:     Thyroid: No thyroid mass or thyromegaly.     Vascular: No carotid bruit.     Trachea: Trachea normal.  Cardiovascular:     Rate and Rhythm: Normal rate and regular rhythm.     Heart sounds: Normal heart sounds, S1 normal and S2 normal. No murmur. No gallop.   Pulmonary:     Effort: Pulmonary effort is normal. No respiratory distress.     Breath sounds: Normal breath  sounds. No wheezing, rhonchi or rales.  Abdominal:     General: Bowel sounds are normal. There is no distension or abdominal bruit.     Palpations: Abdomen is soft. There is no fluid wave or mass.     Tenderness: There is no abdominal tenderness. There is no guarding or rebound.     Hernia: No hernia is present.  Musculoskeletal:     Cervical back: Normal range of motion and neck supple.  Lymphadenopathy:     Cervical: No cervical adenopathy.  Skin:    General: Skin is warm and dry.     Findings: No rash.  Neurological:     Mental Status: She is alert.     Cranial Nerves: No cranial nerve deficit.     Sensory: No sensory deficit.  Psychiatric:        Mood and Affect: Mood is not anxious or depressed.        Speech: Speech normal.        Behavior: Behavior normal. Behavior is cooperative.        Judgment: Judgment normal.      Assessment and Plan    The patient's preventative maintenance and recommended screening tests for an annual wellness exam were reviewed in full today. Brought up to date unless services declined.  Counselled on the importance of diet, exercise, and its role in overall health and mortality. The patient's FH and SH was reviewed, including their home life, tobacco status, and drug and alcohol status.   Vaccines: TDap 03/2019, refused flu Pap/DVE:   Has IUD, last pap 12/2017 Mammo:   01/2019 Colon: no early family history of colon cancer Smoking Status:nonsmoker ETOH/ drug use: none/none HIV screen:  no concern.  Denies need for STD screening in addition.  PHQ9 SCORE ONLY 04/02/2019 03/19/2018 03/19/2018  Score 16 8 0     Kerby Nora, MD

## 2019-04-02 NOTE — Assessment & Plan Note (Addendum)
Refer to counseling. Start sertrlaine 50 mg daily, reviewed SE profile, length of use etc. Follow up in 4 weeks.  If focus not improving along with mood.. consider ADD eval.

## 2019-04-02 NOTE — Assessment & Plan Note (Signed)
Eval with TSH given depressive symptoms.

## 2019-04-02 NOTE — Patient Instructions (Addendum)
Start sertraline for depression/anxiety.  Call to set up counselor.   Please stop at the lab to have labs drawn.

## 2019-04-03 LAB — TSH: TSH: 0.76 mIU/L

## 2019-04-09 ENCOUNTER — Encounter: Payer: Self-pay | Admitting: Family Medicine

## 2019-04-30 ENCOUNTER — Encounter: Payer: Self-pay | Admitting: Family Medicine

## 2019-04-30 ENCOUNTER — Ambulatory Visit: Payer: Managed Care, Other (non HMO) | Admitting: Family Medicine

## 2019-04-30 ENCOUNTER — Other Ambulatory Visit: Payer: Self-pay

## 2019-04-30 DIAGNOSIS — F320 Major depressive disorder, single episode, mild: Secondary | ICD-10-CM

## 2019-04-30 MED ORDER — SERTRALINE HCL 50 MG PO TABS: 50.0000 mg | ORAL_TABLET | Freq: Every day | ORAL | 1 refills | Status: DC

## 2019-04-30 MED ORDER — FLUTICASONE PROPIONATE 50 MCG/ACT NA SUSP: 2.0000 | Freq: Every day | NASAL | 3 refills | Status: DC

## 2019-04-30 NOTE — Patient Instructions (Signed)
Call if not continuing to improve for possible medication increase.

## 2019-04-30 NOTE — Progress Notes (Signed)
Chief Complaint  Patient presents with  . Follow-up    Mood    History of Present Illness: HPI   44 year old female presents for follow up MDD, GAD.   At last OV she was started on sertrlaine 50 mg daily and referred to a counselor. She had also noted issues with focus and may need ADD evaluation. Lab Results  Component Value Date   TSH 0.76 04/02/2019   PHQ9 SCORE ONLY 04/02/2019 03/19/2018 03/19/2018  Score 16 8 0   Today she reports:  No SE. She has noted some improvement in  mood , less irritable. Somewhat calmer. 10 % improvement.  Still anxious.  Sleeping well at night, getting 5 hours of sleep at nights  phq 12 down for 16 This visit occurred during the SARS-CoV-2 public health emergency.  Safety protocols were in place, including screening questions prior to the visit, additional usage of staff PPE, and extensive cleaning of exam room while observing appropriate contact time as indicated for disinfecting solutions.   COVID 19 screen:  No recent travel or known exposure to COVID19 The patient denies respiratory symptoms of COVID 19 at this time. The importance of social distancing was discussed today.     Review of Systems  Constitutional: Negative for chills and fever.  HENT: Negative for congestion and ear pain.   Eyes: Negative for pain and redness.  Respiratory: Negative for cough and shortness of breath.   Cardiovascular: Negative for chest pain, palpitations and leg swelling.  Gastrointestinal: Negative for abdominal pain, blood in stool, constipation, diarrhea, nausea and vomiting.  Genitourinary: Negative for dysuria.  Musculoskeletal: Negative for falls and myalgias.  Skin: Negative for rash.  Neurological: Negative for dizziness.  Psychiatric/Behavioral: Positive for depression. The patient is nervous/anxious.      She has had night sweats for year... recently worse. Has IUD. Past Medical History:  Diagnosis Date  . GERD (gastroesophageal reflux  disease)   . Hiatal hernia   . Palpitations 11/18/2014    reports that she has never smoked. She has never used smokeless tobacco. She reports current alcohol use. She reports that she does not use drugs.   Current Outpatient Medications:  .  fluticasone (FLONASE) 50 MCG/ACT nasal spray, Place 2 sprays into both nostrils daily., Disp: 1 g, Rfl: 11 .  levonorgestrel (MIRENA) 20 MCG/24HR IUD, 1 each by Intrauterine route once., Disp: , Rfl:  .  sertraline (ZOLOFT) 50 MG tablet, Take 1 tablet (50 mg total) by mouth daily., Disp: 30 tablet, Rfl: 5   Observations/Objective: Blood pressure 120/80, pulse 78, temperature 99.7 F (37.6 C), temperature source Temporal, height 5' 2.25" (1.581 m), weight 173 lb (78.5 kg), SpO2 97 %.  Physical Exam Constitutional:      General: She is not in acute distress.    Appearance: Normal appearance. She is well-developed. She is not ill-appearing or toxic-appearing.  HENT:     Head: Normocephalic.     Right Ear: Hearing, tympanic membrane, ear canal and external ear normal. Tympanic membrane is not erythematous, retracted or bulging.     Left Ear: Hearing, tympanic membrane, ear canal and external ear normal. Tympanic membrane is not erythematous, retracted or bulging.     Nose: No mucosal edema or rhinorrhea.     Right Sinus: No maxillary sinus tenderness or frontal sinus tenderness.     Left Sinus: No maxillary sinus tenderness or frontal sinus tenderness.     Mouth/Throat:     Pharynx: Uvula midline.  Eyes:  General: Lids are normal. Lids are everted, no foreign bodies appreciated.     Conjunctiva/sclera: Conjunctivae normal.     Pupils: Pupils are equal, round, and reactive to light.  Neck:     Thyroid: No thyroid mass or thyromegaly.     Vascular: No carotid bruit.     Trachea: Trachea normal.  Cardiovascular:     Rate and Rhythm: Normal rate and regular rhythm.     Pulses: Normal pulses.     Heart sounds: Normal heart sounds, S1 normal and  S2 normal. No murmur. No friction rub. No gallop.   Pulmonary:     Effort: Pulmonary effort is normal. No tachypnea or respiratory distress.     Breath sounds: Normal breath sounds. No decreased breath sounds, wheezing, rhonchi or rales.  Abdominal:     General: Bowel sounds are normal.     Palpations: Abdomen is soft.     Tenderness: There is no abdominal tenderness.  Musculoskeletal:     Cervical back: Normal range of motion and neck supple.  Skin:    General: Skin is warm and dry.     Findings: No rash.  Neurological:     Mental Status: She is alert.  Psychiatric:        Mood and Affect: Mood is not anxious or depressed.        Speech: Speech normal.        Behavior: Behavior normal. Behavior is cooperative.        Thought Content: Thought content normal.        Judgment: Judgment normal.      Assessment and Plan MDD (major depressive disorder), single episode, mild (HCC) Modest improvement with sertraline. Pt not interested in increasing dose. Sleeping well.  Encouraged counseling.       Kerby Nora, MD

## 2019-04-30 NOTE — Assessment & Plan Note (Signed)
Modest improvement with sertraline. Pt not interested in increasing dose. Sleeping well.  Encouraged counseling.

## 2019-06-04 MED ORDER — BUPROPION HCL ER (XL) 150 MG PO TB24: 150.0000 mg | ORAL_TABLET | Freq: Every day | ORAL | 3 refills | Status: DC

## 2019-09-14 ENCOUNTER — Telehealth: Payer: Self-pay

## 2019-09-14 NOTE — Telephone Encounter (Signed)
Noted. Agree with dispo. 

## 2019-09-14 NOTE — Telephone Encounter (Addendum)
Irving Burton front office mgr teamed me to ck with pt about mychart appt pt made on 09/17/19 at 4pm with back and CP for 2-3 wks.  I left v/m for pt to cb to [facility] triage. Pt left v/m returning call. I called pt back; pt said she has been having same CP on and off. That was having when seen at [facility] ED on 02/19/19. Pt works in [employer], pt taking tums and has not helped. Pt does not have SOB. Now BP is 120/80.Pt said upper lt side above breast dull pain that comes and goes and can last for 1 - 2 days and then goes away for awhile and pt cannot tell that diet causes the pain; pt is under stress. Pt said she is having some pain in chest that goes thru to her back now but pt is in no distress, no SOB and pt will wait for appt. On 09/17/19. Pt said they are moving their daughter to school on 09/16/19 and that is why made appt on 09/17/19 at 4pm to be sure pt will be back from moving her daughter. UC & ED precautions given and pt voiced understanding. FYI to Dr Ermalene Searing and Lupita Leash CMA.

## 2019-09-17 ENCOUNTER — Ambulatory Visit: Payer: Managed Care, Other (non HMO) | Admitting: Family Medicine

## 2019-09-21 ENCOUNTER — Other Ambulatory Visit: Payer: Self-pay

## 2019-09-21 ENCOUNTER — Encounter: Payer: Self-pay | Admitting: Family Medicine

## 2019-09-21 ENCOUNTER — Ambulatory Visit: Payer: Managed Care, Other (non HMO) | Admitting: Family Medicine

## 2019-09-21 VITALS — BP 120/88 | HR 78 | Temp 98.1°F | Ht 62.25 in | Wt 184.0 lb

## 2019-09-21 DIAGNOSIS — R0609 Other forms of dyspnea: Secondary | ICD-10-CM | POA: Insufficient documentation

## 2019-09-21 DIAGNOSIS — R0600 Dyspnea, unspecified: Secondary | ICD-10-CM

## 2019-09-21 DIAGNOSIS — R079 Chest pain, unspecified: Secondary | ICD-10-CM

## 2019-09-21 DIAGNOSIS — R0789 Other chest pain: Secondary | ICD-10-CM | POA: Insufficient documentation

## 2019-09-21 NOTE — Assessment & Plan Note (Signed)
Atypical of costochondritis.  Pt denies anxiety.  Eval with EKG and labs.

## 2019-09-21 NOTE — Assessment & Plan Note (Signed)
EKG unremarkable.  Chest pain possibly cardiac given associated dyspnea on exertion. Will eval with labs for anemia, heart failre and ddimer top consider PE although unlikely give pt not high riska nd very stable.  If negative eval.. costochondritis vs occult reflux possible causes. Costochondritis not exactly typical given to ttp, but does seem to be better with chest wall stretching.  Asdd PPI to cover GERD.

## 2019-09-21 NOTE — Patient Instructions (Addendum)
Please stop at the lab to have labs drawn.  Start gentle chest wall stretching.  Can try daily dose of prilosec 20 mg daily to cover for possibility of  Atypical heartburn symptoms.  If severe chest pain or shortness of breath.. go to ER.

## 2019-09-21 NOTE — Progress Notes (Signed)
Chief Complaint  Patient presents with  . Chest Wall Pain    has not gone away-seen in ER for this back in 02/2019    History of Present Illness: HPI    44 year old female nonsmoking  patient presents for ER follow up.   She was seen in ER in 02/2019 for chest wall pain. Pain went away until 3 weeks ago.  Pain is constant.  Left anterior chest wall pain Pain radiates through to left upper back.  No change  Or pain with palpitation  No change with moving arm or neck.  No change with eating. Nothing makes it better. Worse when lying on left side. No rash, no breast lumps.   Occ SOB with exertion and with going up stairs. Mild.  NO cough, no wheeze.   no edema  No N/V, No abdominal pain, no heartburn. No change in activity, no working out.   She tried advil 3 tabs.. did not help.  No family history of CAD.Marland Kitchen brother with afib.  EKG was unremarkable BMET, CBC troponin I normal  CXR: negative.    This visit occurred during the SARS-CoV-2 public health emergency.  Safety protocols were in place, including screening questions prior to the visit, additional usage of staff PPE, and extensive cleaning of exam room while observing appropriate contact time as indicated for disinfecting solutions.   COVID 19 screen:  No recent travel or known exposure to COVID19 The patient denies respiratory symptoms of COVID 19 at this time. The importance of social distancing was discussed today.     ROS    Past Medical History:  Diagnosis Date  . GERD (gastroesophageal reflux disease)   . Hiatal hernia   . Palpitations 11/18/2014    reports that she has never smoked. She has never used smokeless tobacco. She reports current alcohol use. She reports that she does not use drugs.   Current Outpatient Medications:  .  buPROPion (WELLBUTRIN XL) 150 MG 24 hr tablet, Take 1 tablet (150 mg total) by mouth daily., Disp: 30 tablet, Rfl: 3 .  fluticasone (FLONASE) 50 MCG/ACT nasal spray,  Place 2 sprays into both nostrils daily., Disp: 3 g, Rfl: 3 .  levonorgestrel (MIRENA) 20 MCG/24HR IUD, 1 each by Intrauterine route once., Disp: , Rfl:  .  sertraline (ZOLOFT) 50 MG tablet, Take 1 tablet (50 mg total) by mouth daily., Disp: 90 tablet, Rfl: 1   Observations/Objective: Blood pressure 120/88, pulse 78, temperature 98.1 F (36.7 C), temperature source Temporal, height 5' 2.25" (1.581 m), weight 184 lb (83.5 kg), SpO2 100 %.  EKG: Normal sinus rhythm. Normal axis, normal R wave progression, No acute ST elevation or depression. No change from previous.  Physical Exam Constitutional:      General: She is not in acute distress.    Appearance: Normal appearance. She is well-developed. She is not ill-appearing or toxic-appearing.  HENT:     Head: Normocephalic.     Right Ear: Hearing, tympanic membrane, ear canal and external ear normal. Tympanic membrane is not erythematous, retracted or bulging.     Left Ear: Hearing, tympanic membrane, ear canal and external ear normal. Tympanic membrane is not erythematous, retracted or bulging.     Nose: No mucosal edema or rhinorrhea.     Right Sinus: No maxillary sinus tenderness or frontal sinus tenderness.     Left Sinus: No maxillary sinus tenderness or frontal sinus tenderness.     Mouth/Throat:     Pharynx: Uvula midline.  Eyes:     General: Lids are normal. Lids are everted, no foreign bodies appreciated.     Conjunctiva/sclera: Conjunctivae normal.     Pupils: Pupils are equal, round, and reactive to light.  Neck:     Thyroid: No thyroid mass or thyromegaly.     Vascular: No carotid bruit.     Trachea: Trachea normal.  Cardiovascular:     Rate and Rhythm: Normal rate and regular rhythm.     Pulses: Normal pulses.     Heart sounds: Normal heart sounds, S1 normal and S2 normal. No murmur heard.  No friction rub. No gallop.   Pulmonary:     Effort: Pulmonary effort is normal. No tachypnea or respiratory distress.     Breath  sounds: Normal breath sounds. No decreased breath sounds, wheezing, rhonchi or rales.  Abdominal:     General: Bowel sounds are normal.     Palpations: Abdomen is soft.     Tenderness: There is no abdominal tenderness.  Musculoskeletal:     Cervical back: Normal range of motion and neck supple.  Skin:    General: Skin is warm and dry.     Findings: No rash.  Neurological:     Mental Status: She is alert.  Psychiatric:        Mood and Affect: Mood is not anxious or depressed.        Speech: Speech normal.        Behavior: Behavior normal. Behavior is cooperative.        Thought Content: Thought content normal.        Judgment: Judgment normal.      Assessment and Plan   Atypical chest pain EKG unremarkable.  Chest pain possibly cardiac given associated dyspnea on exertion. Will eval with labs for anemia, heart failre and ddimer top consider PE although unlikely give pt not high riska nd very stable.  If negative eval.. costochondritis vs occult reflux possible causes. Costochondritis not exactly typical given to ttp, but does seem to be better with chest wall stretching.  Asdd PPI to cover GERD.  DOE (dyspnea on exertion) Atypical of costochondritis.  Pt denies anxiety.  Eval with EKG and labs.     Kerby Nora, MD

## 2019-09-22 LAB — CBC WITH DIFFERENTIAL/PLATELET
Basophils Absolute: 0.1 10*3/uL (ref 0.0–0.1)
Basophils Relative: 1.1 % (ref 0.0–3.0)
Eosinophils Absolute: 0.1 10*3/uL (ref 0.0–0.7)
Eosinophils Relative: 2 % (ref 0.0–5.0)
HCT: 37.1 % (ref 36.0–46.0)
Hemoglobin: 12.2 g/dL (ref 12.0–15.0)
Lymphocytes Relative: 40.2 % (ref 12.0–46.0)
Lymphs Abs: 2.4 10*3/uL (ref 0.7–4.0)
MCHC: 33 g/dL (ref 30.0–36.0)
MCV: 81.4 fl (ref 78.0–100.0)
Monocytes Absolute: 0.6 10*3/uL (ref 0.1–1.0)
Monocytes Relative: 10.5 % (ref 3.0–12.0)
Neutro Abs: 2.8 10*3/uL (ref 1.4–7.7)
Neutrophils Relative %: 46.2 % (ref 43.0–77.0)
Platelets: 201 10*3/uL (ref 150.0–400.0)
RBC: 4.56 Mil/uL (ref 3.87–5.11)
RDW: 12.8 % (ref 11.5–15.5)
WBC: 6 10*3/uL (ref 4.0–10.5)

## 2019-09-22 LAB — D-DIMER, QUANTITATIVE: D-Dimer, Quant: 0.39 mcg/mL FEU (ref <0.50)

## 2019-09-22 LAB — BRAIN NATRIURETIC PEPTIDE: Pro B Natriuretic peptide (BNP): 43 pg/mL (ref 0.0–100.0)

## 2020-02-02 ENCOUNTER — Telehealth: Payer: Self-pay

## 2020-02-02 NOTE — Telephone Encounter (Signed)
Please call patient to triage to make sure she is not suicidal or needs to be seen. With more details I can assess if an appt is necessary prior to a note being written. Given I am well aware of patient's history and I am not  Available for appt this week... if no acute needs to be seen I will Likely okay the note, but will need her to make appt early next week for evaluation.

## 2020-02-02 NOTE — Telephone Encounter (Signed)
Unable to reach pt by phone; sending note to Donna CMA. ?

## 2020-02-02 NOTE — Telephone Encounter (Signed)
I was unable to reach pt by phone and I left v/m for pt to cb. Sending note to [place] LPN and Lupita Leash CMA.

## 2020-02-02 NOTE — Telephone Encounter (Signed)
Damita Lack, CMA routed conversation to You 1 hour ago (2:15 PM)    Excell Seltzer, MD routed conversation to Damita Lack, CMA 1 hour ago (2:03 PM)   Ermalene Searing, Amy E, MD 1 hour ago (2:03 PM)       Please call patient to triage to make sure she is not suicidal or needs to be seen. With more details I can assess if an appt is necessary prior to a note being written. Given I am well aware of patient's history and I am not  Available for appt this week... if no acute needs to be seen I will Likely okay the note, but will need her to make appt early next week for evaluation.      Documentation    Damita Lack, CMA routed conversation to Excell Seltzer, MD Yesterday (8:19 AM)   [place] Sheer, Amy E, MD 2 days ago   SW   Hi, Dr. Ermalene Searing  I was wondering if I could get a note to be out of work for this week. I've had some emotional things going on. I missed work today 01/31/20 & that's because I just get so overwhelmed and begin to cry and become sad. I just need a break for a while please.

## 2020-02-03 ENCOUNTER — Encounter: Payer: Self-pay | Admitting: *Deleted

## 2020-02-03 NOTE — Telephone Encounter (Signed)
I have also sent a MyChart message asking patient to call the office to speak with our triage nurse per Dr. Daphine Deutscher request.

## 2020-02-03 NOTE — Telephone Encounter (Signed)
Pt called back; pt needs note to be out of work 01/31/20 - 02/04/20 and Pt can return to work on 02/07/2020. Pt said job is so stressful;pt feels emotional and sad when she is at work. Pt said feels sad other times but not nearly as overwhelming as when at work. Pt said she is feeling OK at home. No SI/HI. Pt is taking bupropion 150 mg 24 hr tab daily and sertraline 50 mg taking one tab daily. Pt said has missed 4 days of meds about 2 wks ago but pt is taking meds regularly now. Pt request cb when work note completed. No covid symptoms. Pt scheduled first available appt with Dr Ermalene Searing next wk on 02/10/19 at 9:00 AM. UC & ED precautions given and pt voiced understanding. Sending note to DR [facility].

## 2020-02-03 NOTE — Telephone Encounter (Signed)
Terry Camacho notified that work note is ready to be picked up at the front desk.  She would like to wait to discuss med increase and referral to counselor at her appointment with Dr. Ermalene Searing 02/10/2020.

## 2020-02-03 NOTE — Telephone Encounter (Signed)
Okay to write note as requested for patient. Ask pt if she is interested in increase in sertraline or referral to a counselor as well.

## 2020-02-10 ENCOUNTER — Ambulatory Visit: Payer: Managed Care, Other (non HMO) | Admitting: Family Medicine

## 2020-02-23 IMAGING — CR DG CHEST 2V
1 series · 2 of 2 positions shown · non-contrast
Comparison: 02/23/2015

CLINICAL DATA: Chest pain

EXAM:
CHEST - 2 VIEW

[Series 1: dg chest 2 view · 0.14mm/px · 2 of 2 slices shown]
[im 1/2]
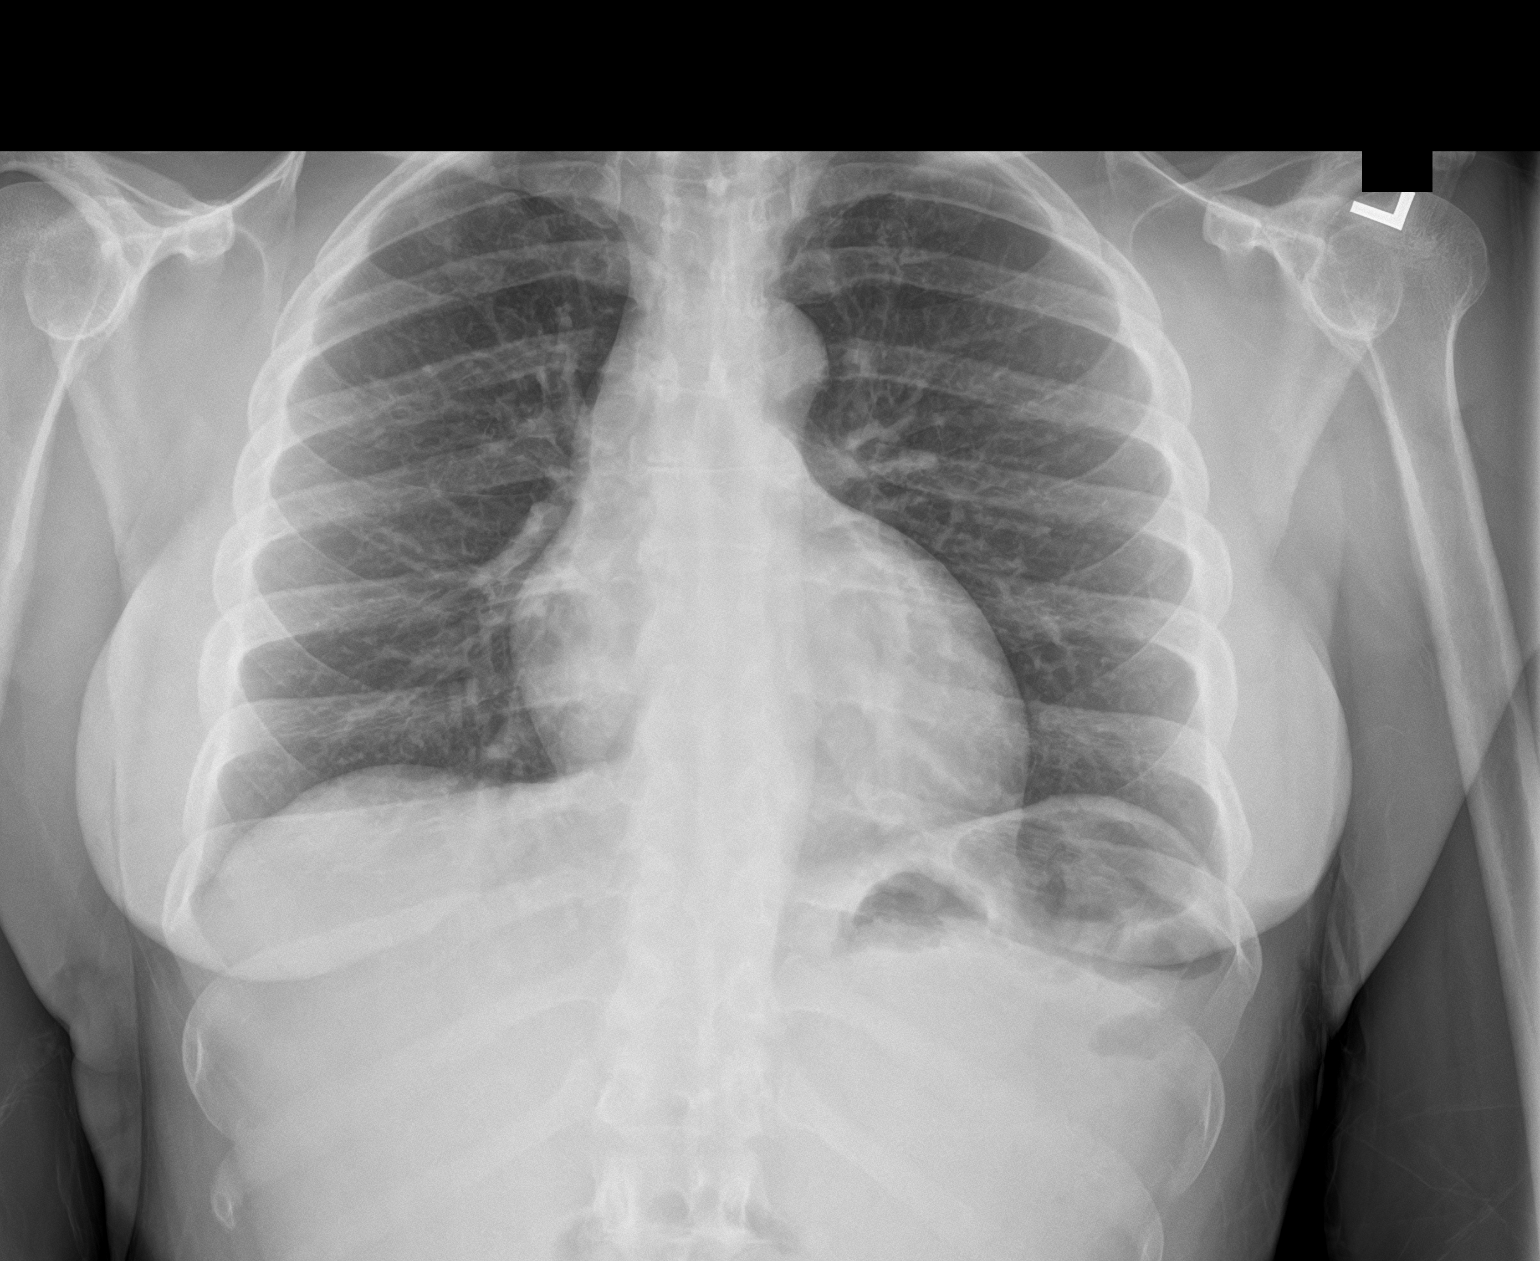
[im 2/2]
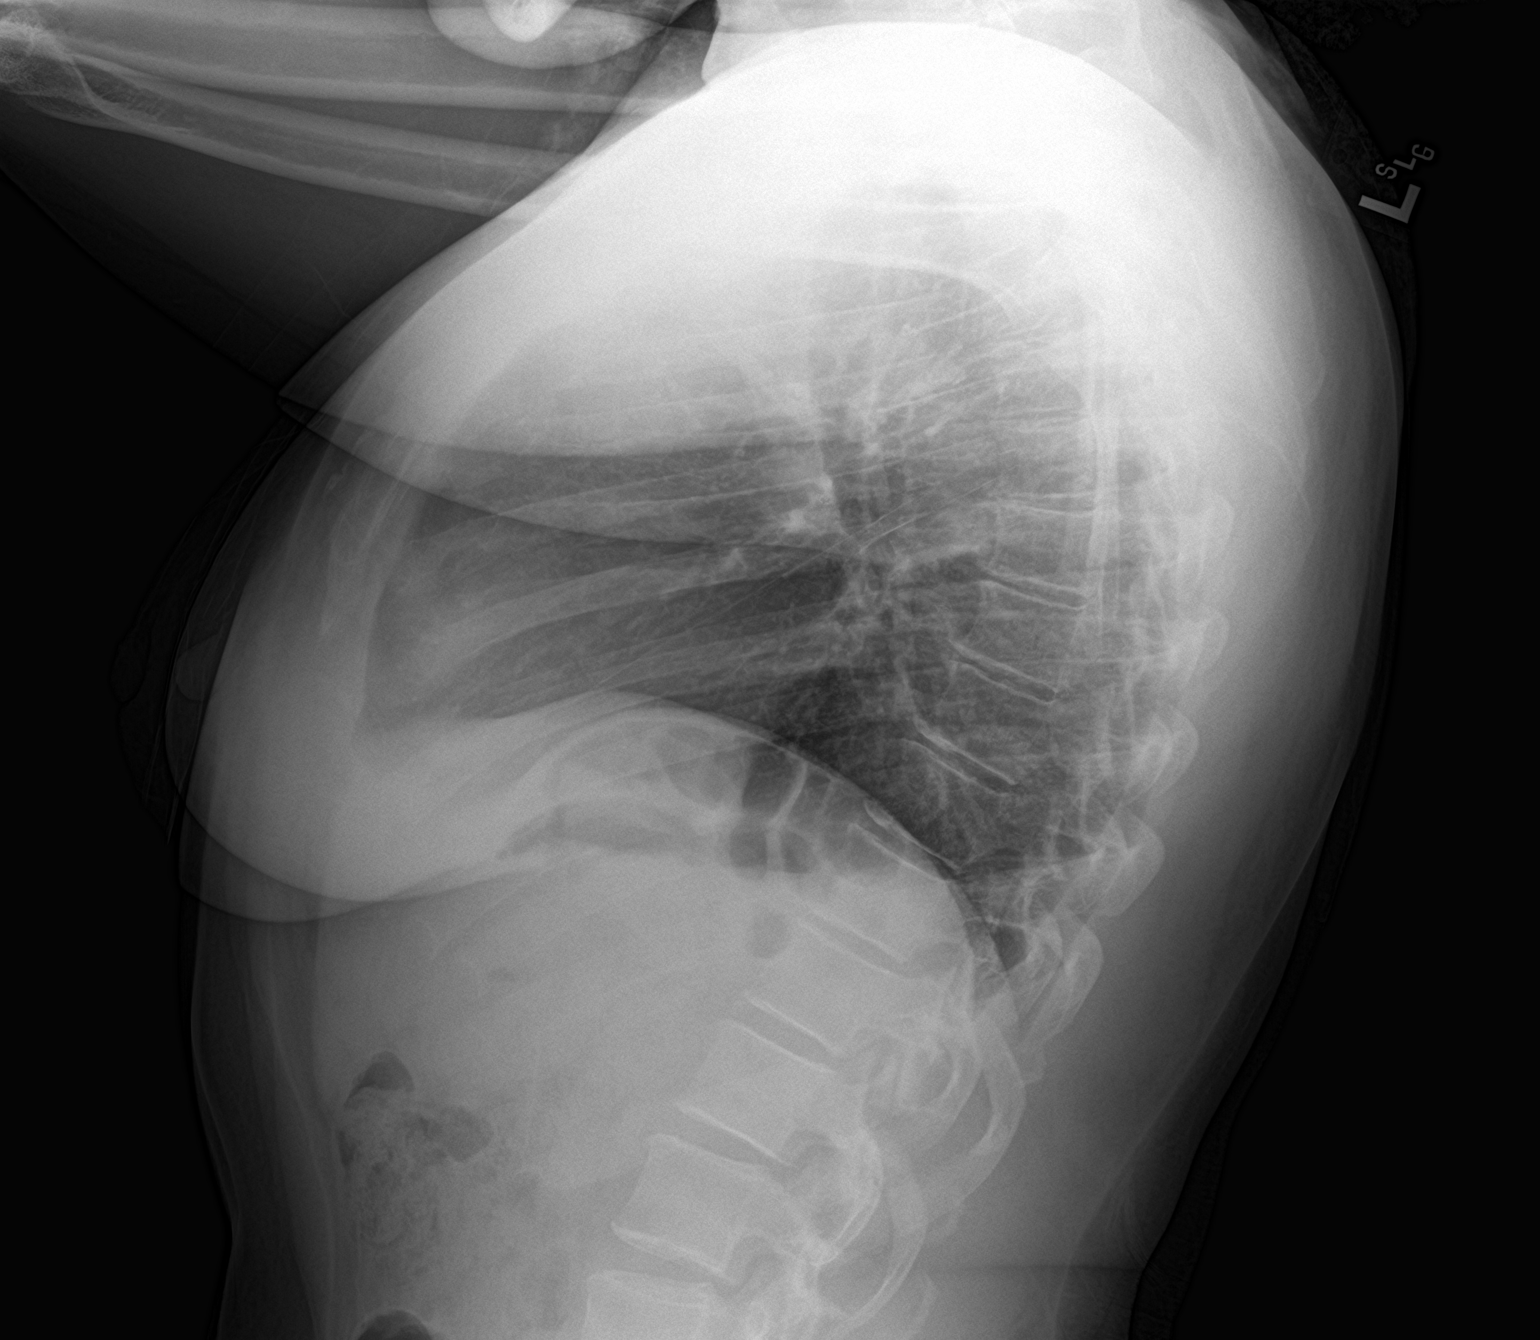

[2 of 2 positions shown; findings below may reference images not displayed]

FINDINGS: Heart and mediastinal contours are within normal limits. No focal
opacities or effusions. No acute bony abnormality.
IMPRESSION: Negative.

## 2020-03-11 ENCOUNTER — Encounter (INDEPENDENT_AMBULATORY_CARE_PROVIDER_SITE_OTHER): Payer: Self-pay

## 2020-08-02 ENCOUNTER — Telehealth: Payer: Self-pay | Admitting: Family Medicine

## 2020-08-02 DIAGNOSIS — Z13220 Encounter for screening for lipoid disorders: Secondary | ICD-10-CM

## 2020-08-02 NOTE — Telephone Encounter (Signed)
-----   Message from Terri J Walsh sent at 07/17/2020 11:49 AM EDT ----- Regarding: Lab orders for Thursday, 6.30.22 Patient is scheduled for CPX labs, please order future labs, Thanks , Terri   

## 2020-08-02 NOTE — Telephone Encounter (Signed)
-----   Message from Alvina Chou sent at 07/17/2020 11:49 AM EDT ----- Regarding: Lab orders for Thursday, 6.30.22 Patient is scheduled for CPX labs, please order future labs, Thanks , Camelia Eng

## 2020-08-03 ENCOUNTER — Other Ambulatory Visit: Payer: Managed Care, Other (non HMO)

## 2020-08-10 ENCOUNTER — Encounter: Payer: Managed Care, Other (non HMO) | Admitting: Family Medicine

## 2020-09-16 ENCOUNTER — Telehealth: Payer: Managed Care, Other (non HMO) | Admitting: Nurse Practitioner

## 2020-09-16 DIAGNOSIS — L247 Irritant contact dermatitis due to plants, except food: Secondary | ICD-10-CM | POA: Diagnosis not present

## 2020-09-16 MED ORDER — DESOXIMETASONE 0.25 % EX CREA: 1.0000 "application " | TOPICAL_CREAM | Freq: Two times a day (BID) | CUTANEOUS | 1 refills | Status: DC

## 2020-09-16 MED ORDER — PREDNISONE 10 MG (21) PO TBPK: ORAL_TABLET | ORAL | 0 refills | Status: DC

## 2020-09-16 NOTE — Progress Notes (Signed)
.   Virtual Visit Consent   Terry Camacho, you are scheduled for a virtual visit with Mary-Margaret Daphine Deutscher, FNP, a [facility] provider, today.     Just as with appointments in the office, your consent must be obtained to participate.  Your consent will be active for this visit and any virtual visit you may have with one of our providers in the next 365 days.     If you have a MyChart account, a copy of this consent can be sent to you electronically.  All virtual visits are billed to your insurance company just like a traditional visit in the office.    As this is a virtual visit, video technology does not allow for your provider to perform a traditional examination.  This may limit your provider's ability to fully assess your condition.  If your provider identifies any concerns that need to be evaluated in person or the need to arrange testing (such as labs, EKG, etc.), we will make arrangements to do so.     Although advances in technology are sophisticated, we cannot ensure that it will always work on either your end or our end.  If the connection with a video visit is poor, the visit may have to be switched to a telephone visit.  With either a video or telephone visit, we are not always able to ensure that we have a secure connection.     I need to obtain your verbal consent now.   Are you willing to proceed with your visit today? YES   Ishita Mcnerney has provided verbal consent on 09/16/2020 for a virtual visit (video or telephone).   Mary-Margaret Daphine Deutscher, FNP   Date: 09/16/2020 8:16 AM   Virtual Visit via Video Note   I, Mary-Margaret Daphine Deutscher, connected with Terry Camacho ([id number], 06/23/1975) on 09/16/20 at  8:30 AM EDT by a video-enabled telemedicine application and verified that I am speaking with the correct person using two identifiers.  Location: Patient: Virtual Visit Location Patient: Home Provider: Virtual Visit Location Provider: Mobile   I discussed the  limitations of evaluation and management by telemedicine and the availability of in person appointments. The patient expressed understanding and agreed to proceed.    History of Present Illness: Terry Camacho is a 45 y.o. who identifies as a female who was assigned female at birth, and is being seen today for rash.  NOI:[id number] scheduled video visit to discuss a rash. She has had a rash on medial aspect of boith knees. Has been there for 2 weeks.  Thought was from poison ivy but calamine lotion lotion has not helped.  Review of Systems  Constitutional: Negative.   Respiratory: Negative.    Cardiovascular: Negative.   Genitourinary: Negative.   Skin:  Positive for rash.  All other systems reviewed and are negative.  Problems:  Patient Active Problem List   Diagnosis Date Noted   DOE (dyspnea on exertion) 09/21/2019   Atypical chest pain 09/21/2019   MDD (major depressive disorder), single episode, mild (HCC) 04/02/2019   Family history of thyroid disease 03/19/2018   History of palpitations 11/18/2014   Umbilical hernia 12/17/2011   GERD 04/28/2007    Allergies:  Allergies  Allergen Reactions   Amoxicillin     REACTION: Hives   Medications:  Current Outpatient Medications:    desoximetasone (TOPICORT) 0.25 % cream, Apply 1 application topically 2 (two) times daily., Disp: 30 g, Rfl: 1   predniSONE (STERAPRED UNI-PAK 21 TAB) 10 MG (21) TBPK  tablet, As directed x 6 days, Disp: 21 tablet, Rfl: 0   buPROPion (WELLBUTRIN XL) 150 MG 24 hr tablet, Take 1 tablet (150 mg total) by mouth daily., Disp: 30 tablet, Rfl: 3   fluticasone (FLONASE) 50 MCG/ACT nasal spray, Place 2 sprays into both nostrils daily., Disp: 3 g, Rfl: 3   levonorgestrel (MIRENA) 20 MCG/24HR IUD, 1 each by Intrauterine route once., Disp: , Rfl:    sertraline (ZOLOFT) 50 MG tablet, Take 1 tablet (50 mg total) by mouth daily., Disp: 90 tablet, Rfl: 1  Observations/Objective: Patient is well-developed,  well-nourished in no acute distress.  Resting comfortably  at home.  Head is normocephalic, atraumatic.  No labored breathing.  Speech is clear and coherent with logical content.  Patient is alert and oriented at baseline.  Vesicular looking lesions on medial aspect of bil knees  Assessment and Plan:  Mouna Yager in today with chief complaint of Rash   1. Irritant contact dermatitis due to plants, except food Cool compresses Avoid scratching Benadryl OTC for itching Meds ordered this encounter  Medications   predniSONE (STERAPRED UNI-PAK 21 TAB) 10 MG (21) TBPK tablet    Sig: As directed x 6 days    Dispense:  21 tablet    Refill:  0    Order Specific Question:   Supervising Provider    Answer:   Hyacinth Meeker, BRIAN [[id number]]   desoximetasone (TOPICORT) 0.25 % cream    Sig: Apply 1 application topically 2 (two) times daily.    Dispense:  30 g    Refill:  1    Order Specific Question:   Supervising Provider    Answer:   Eber Hong [[id number]]       Follow Up Instructions: I discussed the assessment and treatment plan with the patient. The patient was provided an opportunity to ask questions and all were answered. The patient agreed with the plan and demonstrated an understanding of the instructions.  A copy of instructions were sent to the patient via MyChart.  The patient was advised to call back or seek an in-person evaluation if the symptoms worsen or if the condition fails to improve as anticipated.  Time:  I spent 12 minutes with the patient via telehealth technology discussing the above problems/concerns.    Mary-Margaret Daphine Deutscher, FNP

## 2020-09-22 ENCOUNTER — Telehealth: Payer: Managed Care, Other (non HMO) | Admitting: Family Medicine

## 2020-09-24 ENCOUNTER — Telehealth: Payer: Managed Care, Other (non HMO) | Admitting: Family

## 2020-09-24 DIAGNOSIS — B354 Tinea corporis: Secondary | ICD-10-CM | POA: Diagnosis not present

## 2020-09-24 MED ORDER — KETOCONAZOLE 2 % EX GEL: 1.0000 "application " | Freq: Two times a day (BID) | CUTANEOUS | 2 refills | Status: DC

## 2020-09-24 NOTE — Progress Notes (Signed)
E Visit for Rash  We are sorry that you are not feeling well. Here is how we plan to help!   The second picture of the circular rash looks like a fungal infection (ring worm). I have sent in Ketoconazole cream 2% that you will apply twice a day.   If the area does not improve you need to be seen in person for follow up.    HOME CARE:  Take cool showers and avoid direct sunlight. Apply cool compress or wet dressings. Take a bath in an oatmeal bath.  Sprinkle content of one Aveeno packet under running faucet with comfortably warm water.  Bathe for 15-20 minutes, 1-2 times daily.  Pat dry with a towel. Do not rub the rash. Use hydrocortisone cream. Take an antihistamine like Benadryl for widespread rashes that itch.  The adult dose of Benadryl is 25-50 mg by mouth 4 times daily. Caution:  This type of medication may cause sleepiness.  Do not drink alcohol, drive, or operate dangerous machinery while taking antihistamines.  Do not take these medications if you have prostate enlargement.  Read package instructions thoroughly on all medications that you take.  GET HELP RIGHT AWAY IF:  Symptoms don't go away after treatment. Severe itching that persists. If you rash spreads or swells. If you rash begins to smell. If it blisters and opens or develops a yellow-brown crust. You develop a fever. You have a sore throat. You become short of breath.  MAKE SURE YOU:  Understand these instructions. Will watch your condition. Will get help right away if you are not doing well or get worse.  Thank you for choosing an e-visit.  Your e-visit answers were reviewed by a board certified advanced clinical practitioner to complete your personal care plan. Depending upon the condition, your plan could have included both over the counter or prescription medications.  Please review your pharmacy choice. Make sure the pharmacy is open so you can pick up prescription now. If there is a problem, you may  contact your provider through [employer] and have the prescription routed to another pharmacy.  Your safety is important to [place]. If you have drug allergies check your prescription carefully.   For the next 24 hours you can use MyChart to ask questions about today's visit, request a non-urgent call back, or ask for a work or school excuse. You will get an email in the next two days asking about your experience. I hope that your e-visit has been valuable and will speed your recovery.  Approximately 5 minutes was spent documenting and reviewing patient's chart.

## 2021-04-18 LAB — HM MAMMOGRAPHY

## 2021-04-26 ENCOUNTER — Telehealth: Payer: Self-pay | Admitting: Family Medicine

## 2021-04-26 DIAGNOSIS — Z1159 Encounter for screening for other viral diseases: Secondary | ICD-10-CM

## 2021-04-26 DIAGNOSIS — Z13220 Encounter for screening for lipoid disorders: Secondary | ICD-10-CM

## 2021-04-26 NOTE — Telephone Encounter (Signed)
-----   Message from Alvina Chou sent at 04/23/2021  3:42 PM EDT ----- ?Regarding: Lab orders for Tuesday, 3.24.23 ?Patient is scheduled for CPX labs, please order future labs, Thanks , Terri  ? ?

## 2021-04-26 NOTE — Telephone Encounter (Signed)
Labs

## 2021-04-27 ENCOUNTER — Other Ambulatory Visit: Payer: Managed Care, Other (non HMO)

## 2021-05-04 ENCOUNTER — Encounter: Payer: Managed Care, Other (non HMO) | Admitting: Family Medicine

## 2021-08-09 ENCOUNTER — Ambulatory Visit: Payer: Managed Care, Other (non HMO) | Admitting: Family Medicine

## 2021-08-14 ENCOUNTER — Ambulatory Visit: Payer: Managed Care, Other (non HMO) | Admitting: Family Medicine

## 2021-08-14 ENCOUNTER — Encounter: Payer: Self-pay | Admitting: Family Medicine

## 2021-08-14 VITALS — BP 100/70 | HR 70 | Temp 98.6°F | Ht 62.0 in | Wt 175.1 lb

## 2021-08-14 DIAGNOSIS — M75121 Complete rotator cuff tear or rupture of right shoulder, not specified as traumatic: Secondary | ICD-10-CM | POA: Diagnosis not present

## 2021-08-14 MED ORDER — DICLOFENAC SODIUM 75 MG PO TBEC: 75.0000 mg | DELAYED_RELEASE_TABLET | Freq: Two times a day (BID) | ORAL | 1 refills | Status: DC

## 2021-08-14 NOTE — Patient Instructions (Signed)
Continue home PT.  Start diclofenac twice daily as for pain.  If not improving 2-4 week.. call for  further recommendation and possible follow up with [facility].

## 2021-08-14 NOTE — Progress Notes (Signed)
Patient ID: Terry Camacho, female    DOB: Jan 11, 1976, 46 y.o.   MRN: [id number]  This visit was conducted in person.  BP 100/70   Pulse 70   Temp 98.6 F (37 C) (Oral)   Ht 5\' 2"  (1.575 m)   Wt 175 lb 2 oz (79.4 kg)   SpO2 99%   BMI 32.03 kg/m    CC:  Chief Complaint  Patient presents with   Shoulder Pain    Right    Subjective:   HPI: Terry Camacho is a 46 y.o. female presenting on 08/14/2021 for Shoulder Pain (Right)    She reports new onset right shoulder pain. Pain is  worse in  last 5 months.  No known change in activity, no fall.  Pain with abduction and int and ext rotation.  Pain anterior shoulder and  posterior.  No neck pain, no numbness, weakness.  She has used advil 400mg  every /tylenol off and on. Helps a little. Home PT.  Interfering with activities at work.. reaching   05/2017 Dx with rotator cuff tear. Did PT. Improved until now.  If flared up.. given steroid injections. Dr. .. last  steroid injection was  1.5 years ago.  Was told surgery may be needed.. did not want to do this.    MRI 2019 Large full thickness tear of supraspinatus tendon, 12 mm, and moderate tendinosis in infraspinatus.       Relevant past medical, surgical, family and social history reviewed and updated as indicated. Interim medical history since our last visit reviewed. Allergies and medications reviewed and updated. Outpatient Medications Prior to Visit  Medication Sig Dispense Refill   fluticasone (FLONASE) 50 MCG/ACT nasal spray Place 2 sprays into both nostrils daily. 3 g 3   levonorgestrel (MIRENA) 20 MCG/24HR IUD 1 each by Intrauterine route once.     buPROPion (WELLBUTRIN XL) 150 MG 24 hr tablet Take 1 tablet (150 mg total) by mouth daily. 30 tablet 3   desoximetasone (TOPICORT) 0.25 % cream Apply 1 application topically 2 (two) times daily. 30 g 1   Ketoconazole 2 % GEL Apply 1 application topically in the morning and at bedtime. 45 g 2   predniSONE  (STERAPRED UNI-PAK 21 TAB) 10 MG (21) TBPK tablet As directed x 6 days 21 tablet 0   sertraline (ZOLOFT) 50 MG tablet Take 1 tablet (50 mg total) by mouth daily. 90 tablet 1   No facility-administered medications prior to visit.     Per HPI unless specifically indicated in ROS section below Review of Systems  Constitutional:  Negative for fatigue and fever.  HENT:  Negative for congestion.   Eyes:  Negative for pain.  Respiratory:  Negative for cough and shortness of breath.   Cardiovascular:  Negative for chest pain, palpitations and leg swelling.  Gastrointestinal:  Negative for abdominal pain.  Genitourinary:  Negative for dysuria and vaginal bleeding.  Musculoskeletal:  Negative for back pain.  Neurological:  Negative for syncope, light-headedness and headaches.  Psychiatric/Behavioral:  Negative for dysphoric mood.    Objective:  BP 100/70   Pulse 70   Temp 98.6 F (37 C) (Oral)   Ht 5\' 2"  (1.575 m)   Wt 175 lb 2 oz (79.4 kg)   SpO2 99%   BMI 32.03 kg/m   Wt Readings from Last 3 Encounters:  08/14/21 175 lb 2 oz (79.4 kg)  09/21/19 184 lb (83.5 kg)  04/30/19 173 lb (78.5 kg)      Physical  Exam Constitutional:      General: She is not in acute distress.    Appearance: Normal appearance. She is well-developed. She is not ill-appearing or toxic-appearing.  HENT:     Head: Normocephalic.     Right Ear: Hearing, tympanic membrane, ear canal and external ear normal. Tympanic membrane is not erythematous, retracted or bulging.     Left Ear: Hearing, tympanic membrane, ear canal and external ear normal. Tympanic membrane is not erythematous, retracted or bulging.     Nose: No mucosal edema or rhinorrhea.     Right Sinus: No maxillary sinus tenderness or frontal sinus tenderness.     Left Sinus: No maxillary sinus tenderness or frontal sinus tenderness.     Mouth/Throat:     Pharynx: Uvula midline.  Eyes:     General: Lids are normal. Lids are everted, no foreign bodies  appreciated.     Conjunctiva/sclera: Conjunctivae normal.     Pupils: Pupils are equal, round, and reactive to light.  Neck:     Thyroid: No thyroid mass or thyromegaly.     Vascular: No carotid bruit.     Trachea: Trachea normal.  Cardiovascular:     Rate and Rhythm: Normal rate and regular rhythm.     Pulses: Normal pulses.     Heart sounds: Normal heart sounds, S1 normal and S2 normal. No murmur heard.    No friction rub. No gallop.  Pulmonary:     Effort: Pulmonary effort is normal. No tachypnea or respiratory distress.     Breath sounds: Normal breath sounds. No decreased breath sounds, wheezing, rhonchi or rales.  Abdominal:     General: Bowel sounds are normal.     Palpations: Abdomen is soft.     Tenderness: There is no abdominal tenderness.  Musculoskeletal:     Right shoulder: Tenderness and bony tenderness present. Decreased range of motion. Normal strength. Normal pulse.     Left shoulder: Normal.     Right upper arm: Normal.     Left upper arm: Normal.     Right elbow: Normal.     Left elbow: Normal.     Cervical back: Normal, normal range of motion and neck supple.     Thoracic back: Normal.     Comments: Positive empty can arm/Neer's test, negative Spurling bilateral test  Skin:    General: Skin is warm and dry.     Findings: No rash.  Neurological:     Mental Status: She is alert.  Psychiatric:        Mood and Affect: Mood is not anxious or depressed.        Speech: Speech normal.        Behavior: Behavior normal. Behavior is cooperative.        Thought Content: Thought content normal.        Judgment: Judgment normal.       Results for orders placed or performed in visit on 09/21/19  CBC with Differential/Platelet  Result Value Ref Range   WBC 6.0 4.0 - 10.5 K/uL   RBC 4.56 3.87 - 5.11 Mil/uL   Hemoglobin 12.2 12.0 - 15.0 g/dL   HCT 44.0 10.2 - 72.5 %   MCV 81.4 78.0 - 100.0 fl   MCHC 33.0 30.0 - 36.0 g/dL   RDW 36.6 44.0 - 34.7 %   Platelets  201.0 150.0 - 400.0 K/uL   Neutrophils Relative % 46.2 43.0 - 77.0 %   Lymphocytes Relative 40.2 12.0 - 46.0 %  Monocytes Relative 10.5 3.0 - 12.0 %   Eosinophils Relative 2.0 0.0 - 5.0 %   Basophils Relative 1.1 0.0 - 3.0 %   Neutro Abs 2.8 1.4 - 7.7 K/uL   Lymphs Abs 2.4 0.7 - 4.0 K/uL   Monocytes Absolute 0.6 0.1 - 1.0 K/uL   Eosinophils Absolute 0.1 0.0 - 0.7 K/uL   Basophils Absolute 0.1 0.0 - 0.1 K/uL  D-dimer, quantitative (not at [facility])  Result Value Ref Range   D-Dimer, Quant 0.39 <0.50 mcg/mL FEU  Brain natriuretic peptide  Result Value Ref Range   Pro B Natriuretic peptide (BNP) 43.0 0.0 - 100.0 pg/mL     COVID 19 screen:  No recent travel or known exposure to COVID19 The patient denies respiratory symptoms of COVID 19 at this time. The importance of social distancing was discussed today.   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  Continue home physical therapy.  If not improving she will return to see Dr. Lurlean Nanny, sports medicine for possible steroid injection versus further imaging.  No red flags.  No sign of cervical spine involvement        Kerby Nora, MD

## 2021-08-14 NOTE — Assessment & Plan Note (Signed)
Acute flare of chronic issue We will treat with trial of diclofenac 75 mg twice daily.  Continue home physical therapy.  If not improving she will return to see Dr. Lurlean Nanny, sports medicine for possible steroid injection versus further imaging.  No red flags.  No sign of cervical spine involvement

## 2021-09-09 ENCOUNTER — Emergency Department: Payer: Managed Care, Other (non HMO)

## 2021-09-09 ENCOUNTER — Other Ambulatory Visit: Payer: Self-pay

## 2021-09-09 ENCOUNTER — Encounter: Payer: Self-pay | Admitting: Emergency Medicine

## 2021-09-09 ENCOUNTER — Emergency Department
Admission: EM | Admit: 2021-09-09 | Discharge: 2021-09-09 | Disposition: A | Discharge status: Home / Self Care | Payer: Managed Care, Other (non HMO) | Attending: Emergency Medicine | Admitting: Emergency Medicine

## 2021-09-09 DIAGNOSIS — R0602 Shortness of breath: Secondary | ICD-10-CM | POA: Insufficient documentation

## 2021-09-09 DIAGNOSIS — Z20822 Contact with and (suspected) exposure to covid-19: Secondary | ICD-10-CM | POA: Diagnosis not present

## 2021-09-09 DIAGNOSIS — R072 Precordial pain: Secondary | ICD-10-CM | POA: Diagnosis not present

## 2021-09-09 DIAGNOSIS — R0789 Other chest pain: Secondary | ICD-10-CM

## 2021-09-09 LAB — BASIC METABOLIC PANEL
Anion gap: 6 (ref 5–15)
BUN: 17 mg/dL (ref 6–20)
CO2: 24 mmol/L (ref 22–32)
Calcium: 8.5 mg/dL — ABNORMAL LOW (ref 8.9–10.3)
Chloride: 108 mmol/L (ref 98–111)
Creatinine, Ser: 1 mg/dL (ref 0.44–1.00)
GFR, Estimated: >60 mL/min (ref >60)
Glucose, Bld: 96 mg/dL (ref 70–99)
Potassium: 4.2 mmol/L (ref 3.5–5.1)
Sodium: 138 mmol/L (ref 135–145)

## 2021-09-09 LAB — HEPATIC FUNCTION PANEL
ALT: 11 U/L (ref 0–44)
AST: 19 U/L (ref 15–41)
Albumin: 3.8 g/dL (ref 3.5–5.0)
Alkaline Phosphatase: 58 U/L (ref 38–126)
Bilirubin, Direct: 0.1 mg/dL (ref 0.0–0.2)
Indirect Bilirubin: 0.7 mg/dL (ref 0.3–0.9)
Total Bilirubin: 0.8 mg/dL (ref 0.3–1.2)
Total Protein: 6.8 g/dL (ref 6.5–8.1)

## 2021-09-09 LAB — D-DIMER, QUANTITATIVE: D-Dimer, Quant: <0.27 ug/mL-FEU (ref 0.00–0.50)

## 2021-09-09 LAB — CBC
HCT: 40.2 % (ref 36.0–46.0)
Hemoglobin: 12.6 g/dL (ref 12.0–15.0)
MCH: 25.6 pg — ABNORMAL LOW (ref 26.0–34.0)
MCHC: 31.3 g/dL (ref 30.0–36.0)
MCV: 81.7 fL (ref 80.0–100.0)
Platelets: 218 10*3/uL (ref 150–400)
RBC: 4.92 MIL/uL (ref 3.87–5.11)
RDW: 12.8 % (ref 11.5–15.5)
WBC: 6.2 10*3/uL (ref 4.0–10.5)
nRBC: 0 % (ref 0.0–0.2)

## 2021-09-09 LAB — SARS CORONAVIRUS 2 BY RT PCR: SARS Coronavirus 2 by RT PCR: NEGATIVE

## 2021-09-09 LAB — HCG, QUANTITATIVE, PREGNANCY: hCG, Beta Chain, Quant, S: <1 m[IU]/mL (ref <5)

## 2021-09-09 LAB — TROPONIN I (HIGH SENSITIVITY): Troponin I (High Sensitivity): <2 ng/L (ref <18)

## 2021-09-09 LAB — LIPASE, BLOOD: Lipase: 33 U/L (ref 11–51)

## 2021-09-09 MED ORDER — ACETAMINOPHEN 500 MG PO TABS
1000.0000 mg | ORAL_TABLET | Freq: Once | ORAL | Status: AC
  Administered 2021-09-09: 1000 mg via ORAL
  Filled 2021-09-09: qty 2

## 2021-09-09 MED ORDER — KETOROLAC TROMETHAMINE 15 MG/ML IJ SOLN
15.0000 mg | Freq: Once | INTRAMUSCULAR | Status: AC
  Administered 2021-09-09: 15 mg via INTRAVENOUS
  Filled 2021-09-09: qty 1

## 2021-09-09 MED ORDER — ALUM & MAG HYDROXIDE-SIMETH 200-200-20 MG/5ML PO SUSP
30.0000 mL | Freq: Once | ORAL | Status: AC
  Administered 2021-09-09: 30 mL via ORAL
  Filled 2021-09-09: qty 30

## 2021-09-09 NOTE — ED Provider Notes (Addendum)
[facility] Provider Note    Event Date/Time   First MD Initiated Contact with Patient 09/09/21 1350     (approximate)   History   Chest Pain and Shortness of Breath   HPI  Terry Camacho is a 46 y.o. female who comes in with chest pain since Thursday.  Patient reports having some chest tightness in the mid chest since Thursday that is pretty constant.  She does report some associated sickness of breath.  No risk factors for PE but does have an IUD.  She denies any abdominal pain or it feeling like a burning sensation.  Denies any fevers, leg swelling.  She has not ever seen cardiology before patient told that it was a muscle strain but denies doing any heavy lifting.  She is taking Tylenol and ibuprofen for pain without significant relief.  Physical Exam   Triage Vital Signs: ED Triage Vitals  Enc Vitals Group     BP 09/09/21 1335 112/80     Pulse Rate 09/09/21 1335 78     Resp 09/09/21 1335 20     Temp 09/09/21 1335 98.8 F (37.1 C)     Temp src --      SpO2 09/09/21 1335 97 %     Weight 09/09/21 1317 174 lb 2.6 oz (79 kg)     Height 09/09/21 1317 5\' 2"  (1.575 m)     Head Circumference --      Peak Flow --      Pain Score 09/09/21 1317 7     Pain Loc --      Pain Edu? --      Excl. in GC? --     Most recent vital signs: Vitals:   09/09/21 1335  BP: 112/80  Pulse: 78  Resp: 20  Temp: 98.8 F (37.1 C)  SpO2: 97%     General: Awake, no distress.  CV:  Good peripheral perfusion.  Resp:  Normal effort.  Abd:  No distention.  Other:  Some mild reproducible chest wall tenderness midsternal.  Clear lungs without wheezing.  No swelling in the legs.  No calf tenderness No rash   ED Results / Procedures / Treatments   Labs (all labs ordered are listed, but only abnormal results are displayed) Labs Reviewed  CBC - Abnormal; Notable for the following components:      Result Value   MCH 25.6 (*)    All other components within normal  limits  BASIC METABOLIC PANEL  TROPONIN I (HIGH SENSITIVITY)     EKG  My interpretation of EKG:  Normal sinus rate 69 yes elevation T wave versions, normal intervals  RADIOLOGY I have reviewed the xray personally and interpreted and no evidence of any pneumonia  PROCEDURES:  Critical Care performed: No  .1-3 Lead EKG Interpretation  Performed by: 11/09/21, MD Authorized by: Concha Se, MD     Interpretation: normal     ECG rate:  70   ECG rate assessment: normal     Rhythm: sinus rhythm     Ectopy: none     Conduction: normal      MEDICATIONS ORDERED IN ED: Medications - No data to display   IMPRESSION / MDM / ASSESSMENT AND PLAN / ED COURSE  I reviewed the triage vital signs and the nursing notes.   Patient's presentation is most consistent with acute presentation with potential threat to life or bodily function.   Differential includes ACS, PE, COVID, pneumonia.  Lower suspicion for abdominal pathology given low lower abdominal tenderness.  BMP reassuring CBC reassuring troponin negative  Patient was given some Toradol and Tylenol for pain.  Patient began to talk to oncoming team pain blood work and reassessment  Trop negative pain >3 days- low heart score can dc. Ddimer negative   Re-evaluated pt- still having same pain but seems like atypical chest pain. Given re-assuring workup okay with dc home with cards follow up and understands to reutrn if symptoms are changing.   The patient is on the cardiac monitor to evaluate for evidence of arrhythmia and/or significant heart rate changes.      FINAL CLINICAL IMPRESSION(S) / ED DIAGNOSES   Final diagnoses:  Atypical chest pain     Rx / DC Orders   ED Discharge Orders          Ordered    Ambulatory referral to Cardiology        09/09/21 1454             Note:  This document was prepared using Dragon voice recognition software and may include unintentional dictation errors.    Concha Se, MD 09/09/21 1454    Concha Se, MD 09/09/21 1515

## 2021-09-09 NOTE — ED Triage Notes (Signed)
Pt reports CP to her mid chest that is tight in nature and constant since Thursday.Pt reports hx of the same and was told it was a muscle but she cannot think of anything she may have done to pull a muscle. Pt reports happens intermittently and she usually just waits it out.

## 2021-09-09 NOTE — Discharge Instructions (Signed)
Please seek medical attention for any high fevers, chest pain, shortness of breath, change in behavior, persistent vomiting, bloody stool or any other new or concerning symptoms.  

## 2021-09-09 NOTE — ED Notes (Signed)
Assumed care, pt in NAD breathing e/u. Placed on monitor, family at bedside. No needs at this time.

## 2021-09-12 ENCOUNTER — Encounter: Payer: Self-pay | Admitting: Family Medicine

## 2021-09-12 ENCOUNTER — Ambulatory Visit: Payer: Managed Care, Other (non HMO) | Admitting: Family Medicine

## 2021-09-12 ENCOUNTER — Encounter (INDEPENDENT_AMBULATORY_CARE_PROVIDER_SITE_OTHER): Payer: Self-pay

## 2021-09-12 VITALS — BP 104/80 | HR 89 | Temp 98.3°F | Ht 62.0 in | Wt 175.5 lb

## 2021-09-12 DIAGNOSIS — M75121 Complete rotator cuff tear or rupture of right shoulder, not specified as traumatic: Secondary | ICD-10-CM | POA: Diagnosis not present

## 2021-09-12 MED ORDER — TRIAMCINOLONE ACETONIDE 40 MG/ML IJ SUSP
40.0000 mg | Freq: Once | INTRAMUSCULAR | Status: AC
  Administered 2021-09-12: 40 mg via INTRA_ARTICULAR

## 2021-09-12 NOTE — Progress Notes (Signed)
Donnivan Villena T. Jacek Colson, MD, CAQ Sports Medicine [facility] at [facility] [address] [place], [address]  Phone: [phone]  FAX: [phone]  Terry Camacho - 46 y.o. female  MRN [id number]  Date of Birth: Aug 28, 1975  Date: 09/12/2021  PCP: Excell Seltzer, MD  Referral: Excell Seltzer, MD  Chief Complaint  Patient presents with   Shoulder Pain    Right   Subjective:   Terry Camacho is a 46 y.o. very pleasant female patient with Body mass index is 32.1 kg/m. who presents with the following:  H/o large R RTC tear of the supraspinatus in 2019 that was managed non-operatively by Dr. Dion Saucier.  Did work on PT, helped some and kept working out at home.  Was still able to function and doing ok.   She continues to have some loss of strength and functional disability with the shoulder and pain in the point of abduction, flexion, as well as internal range of motion.  At times he has been completely asymptomatic and has been so for years at a time.  She did and was found to have a rotator cuff tear almost 5 years ago.  Review of Systems is noted in the HPI, as appropriate  Objective:   BP 104/80   Pulse 89   Temp 98.3 F (36.8 C) (Oral)   Ht 5\' 2"  (1.575 m)   Wt 175 lb 8 oz (79.6 kg)   SpO2 98%   BMI 32.10 kg/m   GEN: No acute distress; alert,appropriate. PULM: Breathing comfortably in no respiratory distress PSYCH: Normally interactive.    Shoulder: R Inspection: No muscle wasting or winging Ecchymosis/edema: neg  AC joint, scapula, clavicle: NT Cervical spine: NT, full ROM Spurling's: neg Abduction: full, 5/5 Flexion: full, 5/5 IR, full, lift-off: 5/5 ER at neutral: full, 5/5 AC crossover: neg Neer: pos Hawkins: pos Drop Test: neg Empty Can: pos Supraspinatus insertion: mild-mod T Bicipital groove: NT Speed's: neg Yergason's: neg Sulcus sign: neg Scapular dyskinesis: none C5-T1 intact  Neuro: Sensation intact Grip  5/5   Laboratory and Imaging Data:  Assessment and Plan:     ICD-10-CM   1. Nontraumatic complete tear of right rotator cuff  M75.121      MRI proven full-thickness rotator cuff tear.  Interestingly, on the MRI report from 2019 does note a left-sided rotator cuff tear, but the patient is a very good historian and she tells me that it was in fact her right rotator cuff tear.  She does have supraspinatus tear.  My best advice is the patient to consider operative repair given her age and functional level.  At this time, she will consider this, and I do think it is probably reasonable to do a subacromial injection to help her have some increased function.  Aspiration/Injection Procedure Note Terry Camacho 21-Jul-1975 Date of procedure: 09/12/2021  Procedure: Large Joint Aspiration / Injection of Shoulder, Subacromial, R Indications: Pain  Procedure Details Verbal consent was obtained from the patient. Risks, benefits, and alternatives were explained. Patient prepped with Chloraprep and Ethyl Chloride used for anesthesia. The subacromial space was injected using the posterior approach. The patient tolerated the procedure well and had decreased pain post injection. No complications. Injection: 9 cc of Lidocaine 1% and 1 mL of Kenalog 40 mg. Needle: 22 gauge Medication: 1 mL of Kenalog 40 mg   Dragon [employer] One speech-to-text software was used for transcription in this dictation.  Possible transcriptional errors can occur using 11/12/2021.  Signed,  Elpidio Galea. Terry Xu, MD   Outpatient Encounter Medications as of 09/12/2021  Medication Sig   fluticasone (FLONASE) 50 MCG/ACT nasal spray Place 2 sprays into both nostrils daily.   levonorgestrel (MIRENA) 20 MCG/24HR IUD 1 each by Intrauterine route once.   [DISCONTINUED] diclofenac (VOLTAREN) 75 MG EC tablet Take 1 tablet (75 mg total) by mouth 2 (two) times daily.   No facility-administered encounter medications on file as of  09/12/2021.

## 2021-10-08 ENCOUNTER — Telehealth: Payer: Self-pay | Admitting: Family Medicine

## 2021-10-08 DIAGNOSIS — Z13220 Encounter for screening for lipoid disorders: Secondary | ICD-10-CM

## 2021-10-08 DIAGNOSIS — Z1159 Encounter for screening for other viral diseases: Secondary | ICD-10-CM

## 2021-10-08 NOTE — Telephone Encounter (Signed)
-----   Message from Terri J Walsh sent at 10/02/2021  3:22 PM EDT ----- Regarding: Lab orders for Tuesday, 9.12.23 Patient is scheduled for CPX labs, please order future labs, Thanks , Terri   

## 2021-10-08 NOTE — Telephone Encounter (Signed)
-----   Message from Alvina Chou sent at 10/02/2021  3:22 PM EDT ----- Regarding: Lab orders for Tuesday, 9.12.23 Patient is scheduled for CPX labs, please order future labs, Thanks , Camelia Eng

## 2021-10-09 ENCOUNTER — Ambulatory Visit (HOSPITAL_BASED_OUTPATIENT_CLINIC_OR_DEPARTMENT_OTHER): Payer: Managed Care, Other (non HMO) | Admitting: Cardiovascular Disease

## 2021-10-16 ENCOUNTER — Other Ambulatory Visit (INDEPENDENT_AMBULATORY_CARE_PROVIDER_SITE_OTHER): Payer: Managed Care, Other (non HMO)

## 2021-10-16 DIAGNOSIS — Z13220 Encounter for screening for lipoid disorders: Secondary | ICD-10-CM

## 2021-10-16 DIAGNOSIS — Z1159 Encounter for screening for other viral diseases: Secondary | ICD-10-CM

## 2021-10-16 LAB — LIPID PANEL
Cholesterol: 142 mg/dL (ref 0–200)
HDL: 56.3 mg/dL (ref >39.00)
LDL Cholesterol: 77 mg/dL (ref 0–99)
NonHDL: 85.92
Total CHOL/HDL Ratio: 3
Triglycerides: 47 mg/dL (ref 0.0–149.0)
VLDL: 9.4 mg/dL (ref 0.0–40.0)

## 2021-10-16 LAB — COMPREHENSIVE METABOLIC PANEL
ALT: 9 U/L (ref 0–35)
AST: 18 U/L (ref 0–37)
Albumin: 4 g/dL (ref 3.5–5.2)
Alkaline Phosphatase: 56 U/L (ref 39–117)
BUN: 9 mg/dL (ref 6–23)
CO2: 27 mEq/L (ref 19–32)
Calcium: 8.7 mg/dL (ref 8.4–10.5)
Chloride: 104 mEq/L (ref 96–112)
Creatinine, Ser: 1.01 mg/dL (ref 0.40–1.20)
GFR: 67.52 mL/min (ref >60.00)
Glucose, Bld: 91 mg/dL (ref 70–99)
Potassium: 4 mEq/L (ref 3.5–5.1)
Sodium: 138 mEq/L (ref 135–145)
Total Bilirubin: 0.7 mg/dL (ref 0.2–1.2)
Total Protein: 6.3 g/dL (ref 6.0–8.3)

## 2021-10-16 NOTE — Progress Notes (Signed)
No critical labs need to be addressed urgently. We will discuss labs in detail at upcoming office visit.   

## 2021-10-17 LAB — HEPATITIS C ANTIBODY: Hepatitis C Ab: NONREACTIVE

## 2021-10-17 NOTE — Progress Notes (Signed)
No critical labs need to be addressed urgently. We will discuss labs in detail at upcoming office visit.   

## 2021-10-23 ENCOUNTER — Ambulatory Visit (INDEPENDENT_AMBULATORY_CARE_PROVIDER_SITE_OTHER): Payer: Managed Care, Other (non HMO) | Admitting: Family Medicine

## 2021-10-23 ENCOUNTER — Encounter: Payer: Self-pay | Admitting: Family Medicine

## 2021-10-23 VITALS — BP 100/80 | HR 90 | Temp 98.4°F | Ht 62.0 in | Wt 174.1 lb

## 2021-10-23 DIAGNOSIS — E669 Obesity, unspecified: Secondary | ICD-10-CM | POA: Diagnosis not present

## 2021-10-23 DIAGNOSIS — Z0000 Encounter for general adult medical examination without abnormal findings: Secondary | ICD-10-CM | POA: Diagnosis not present

## 2021-10-23 DIAGNOSIS — Z6831 Body mass index (BMI) 31.0-31.9, adult: Secondary | ICD-10-CM | POA: Diagnosis not present

## 2021-10-23 NOTE — Assessment & Plan Note (Signed)
Encouraged continued exercise, weight loss, continued healthy eating habits.  She is very active, working on low carb diet with my fitness pal. Suggested trial of intermittent fasting.

## 2021-10-23 NOTE — Progress Notes (Signed)
Patient ID: Aryia Delira, female    DOB: 1975/12/28, 46 y.o.   MRN: [id number]  This visit was conducted in person.  BP 100/80   Pulse 90   Temp 98.4 F (36.9 C) (Oral)   Ht 5\' 2"  (1.575 m)   Wt 174 lb 2 oz (79 kg)   SpO2 98%   BMI 31.85 kg/m    CC:  Chief Complaint  Patient presents with   Annual Exam    Subjective:   HPI: Raquelle Pietro is a 46 y.o. female presenting on 10/23/2021 for Annual Exam  MDD, mild:  well controlled, per pt. NO need for medicaiton. [place] [facility] Visit from 04/30/2019 in [place] at [facility]  PHQ-2 Total Score 5      Diet: moderate Exercise: strength training, 30  min stairmaster 2 times a week.   Body mass index is 31.85 kg/m. Wt Readings from Last 3 Encounters:  10/23/21 174 lb 2 oz (79 kg)  09/12/21 175 lb 8 oz (79.6 kg)  09/09/21 174 lb 2.6 oz (79 kg)     Reviewed labs in detail with patient. Lab Results  Component Value Date   CHOL 142 10/16/2021   HDL 56.30 10/16/2021   LDLCALC 77 10/16/2021   TRIG 47.0 10/16/2021   CHOLHDL 3 10/16/2021   The 10-year ASCVD risk score (Arnett DK, et al., 2019) is: 0.2%   Values used to calculate the score:     Age: 79 years     Sex: Female     Is Non-Hispanic African American: Yes     Diabetic: No     Tobacco smoker: No     Systolic Blood Pressure: 299 mmHg     Is BP treated: No     HDL Cholesterol: 56.3 mg/dL     Total Cholesterol: 142 mg/dL     Right shoulder pain... improved after steroid injection with Dr. Lorelei Pont.  Relevant past medical, surgical, family and social history reviewed and updated as indicated. Interim medical history since our last visit reviewed. Allergies and medications reviewed and updated. Outpatient Medications Prior to Visit  Medication Sig Dispense Refill   fluticasone (FLONASE) 50 MCG/ACT nasal spray Place 2 sprays into both nostrils daily. 3 g 3   levonorgestrel (MIRENA) 20 MCG/24HR IUD 1 each by Intrauterine route once.      No facility-administered medications prior to visit.     Per HPI unless specifically indicated in ROS section below Review of Systems  Constitutional:  Negative for fatigue and fever.  HENT:  Negative for congestion.   Eyes:  Negative for pain.  Respiratory:  Negative for cough and shortness of breath.   Cardiovascular:  Negative for chest pain, palpitations and leg swelling.  Gastrointestinal:  Negative for abdominal pain.  Genitourinary:  Negative for dysuria and vaginal bleeding.  Musculoskeletal:  Negative for back pain.  Neurological:  Negative for syncope, light-headedness and headaches.  Psychiatric/Behavioral:  Negative for dysphoric mood.    Objective:  BP 100/80   Pulse 90   Temp 98.4 F (36.9 C) (Oral)   Ht 5\' 2"  (1.575 m)   Wt 174 lb 2 oz (79 kg)   SpO2 98%   BMI 31.85 kg/m   Wt Readings from Last 3 Encounters:  10/23/21 174 lb 2 oz (79 kg)  09/12/21 175 lb 8 oz (79.6 kg)  09/09/21 174 lb 2.6 oz (79 kg)      Physical Exam Vitals and nursing note reviewed.  Constitutional:  General: She is not in acute distress.    Appearance: Normal appearance. She is well-developed. She is not ill-appearing or toxic-appearing.  HENT:     Head: Normocephalic.     Right Ear: Hearing, tympanic membrane, ear canal and external ear normal.     Left Ear: Hearing, tympanic membrane, ear canal and external ear normal.     Nose: Nose normal.  Eyes:     General: Lids are normal. Lids are everted, no foreign bodies appreciated.     Conjunctiva/sclera: Conjunctivae normal.     Pupils: Pupils are equal, round, and reactive to light.  Neck:     Thyroid: No thyroid mass or thyromegaly.     Vascular: No carotid bruit.     Trachea: Trachea normal.  Cardiovascular:     Rate and Rhythm: Normal rate and regular rhythm.     Heart sounds: Normal heart sounds, S1 normal and S2 normal. No murmur heard.    No gallop.  Pulmonary:     Effort: Pulmonary effort is normal. No respiratory  distress.     Breath sounds: Normal breath sounds. No wheezing, rhonchi or rales.  Abdominal:     General: Bowel sounds are normal. There is no distension or abdominal bruit.     Palpations: Abdomen is soft. There is no fluid wave or mass.     Tenderness: There is no abdominal tenderness. There is no guarding or rebound.     Hernia: No hernia is present.  Musculoskeletal:     Cervical back: Normal range of motion and neck supple.  Lymphadenopathy:     Cervical: No cervical adenopathy.  Skin:    General: Skin is warm and dry.     Findings: No rash.  Neurological:     Mental Status: She is alert.     Cranial Nerves: No cranial nerve deficit.     Sensory: No sensory deficit.  Psychiatric:        Mood and Affect: Mood is not anxious or depressed.        Speech: Speech normal.        Behavior: Behavior normal. Behavior is cooperative.        Judgment: Judgment normal.       Results for orders placed or performed in visit on 10/16/21  Comprehensive metabolic panel  Result Value Ref Range   Sodium 138 135 - 145 mEq/L   Potassium 4.0 3.5 - 5.1 mEq/L   Chloride 104 96 - 112 mEq/L   CO2 27 19 - 32 mEq/L   Glucose, Bld 91 70 - 99 mg/dL   BUN 9 6 - 23 mg/dL   Creatinine, Ser 1.01 0.40 - 1.20 mg/dL   Total Bilirubin 0.7 0.2 - 1.2 mg/dL   Alkaline Phosphatase 56 39 - 117 U/L   AST 18 0 - 37 U/L   ALT 9 0 - 35 U/L   Total Protein 6.3 6.0 - 8.3 g/dL   Albumin 4.0 3.5 - 5.2 g/dL   GFR 67.52 >60.00 mL/min   Calcium 8.7 8.4 - 10.5 mg/dL  Lipid panel  Result Value Ref Range   Cholesterol 142 0 - 200 mg/dL   Triglycerides 47.0 0.0 - 149.0 mg/dL   HDL 56.30 >39.00 mg/dL   VLDL 9.4 0.0 - 40.0 mg/dL   LDL Cholesterol 77 0 - 99 mg/dL   Total CHOL/HDL Ratio 3    NonHDL 85.92   Hepatitis C antibody  Result Value Ref Range   Hepatitis C Ab NON-REACTIVE NON-REACTIVE  COVID 19 screen:  No recent travel or known exposure to COVID19 The patient denies respiratory symptoms of COVID 19  at this time. The importance of social distancing was discussed today.   Assessment and Plan The patient's preventative maintenance and recommended screening tests for an annual wellness exam were reviewed in full today. Brought up to date unless services declined.  Counselled on the importance of diet, exercise, and its role in overall health and mortality. The patient's FH and SH was reviewed, including their home life, tobacco status, and drug and alcohol status.   Vaccines: TDap 03/2019, refused flu today.. will get at work Pap/DVE:   Has IUD, last pap 01/2019 per GYN 04/11/21 Mammo:   01/2019, done per GYN 04/11/21 Colon: no early family history of colon cancer Smoking Status:nonsmoker ETOH/ drug use:  occ/none   HIV screen:   no concern.  Denies need for STD screening in addition.   Problem List Items Addressed This Visit     Class 1 obesity without serious comorbidity with body mass index (BMI) of 31.0 to 31.9 in adult    Encouraged continued exercise, weight loss, continued healthy eating habits.  She is very active, working on low carb diet with my fitness pal. Suggested trial of intermittent fasting.       Other Visit Diagnoses     Routine general medical examination at a health care facility    -  Primary          Eliezer Lofts, MD

## 2021-12-08 ENCOUNTER — Other Ambulatory Visit: Payer: Self-pay

## 2021-12-08 ENCOUNTER — Emergency Department
Admission: EM | Admit: 2021-12-08 | Discharge: 2021-12-08 | Disposition: A | Discharge status: Home / Self Care | Payer: Managed Care, Other (non HMO) | Attending: Emergency Medicine | Admitting: Emergency Medicine

## 2021-12-08 ENCOUNTER — Ambulatory Visit: Payer: Self-pay

## 2021-12-08 ENCOUNTER — Encounter: Payer: Self-pay | Admitting: Emergency Medicine

## 2021-12-08 ENCOUNTER — Emergency Department: Payer: Managed Care, Other (non HMO)

## 2021-12-08 DIAGNOSIS — Y92410 Unspecified street and highway as the place of occurrence of the external cause: Secondary | ICD-10-CM | POA: Insufficient documentation

## 2021-12-08 DIAGNOSIS — S4992XA Unspecified injury of left shoulder and upper arm, initial encounter: Secondary | ICD-10-CM | POA: Insufficient documentation

## 2021-12-08 DIAGNOSIS — S161XXA Strain of muscle, fascia and tendon at neck level, initial encounter: Secondary | ICD-10-CM | POA: Insufficient documentation

## 2021-12-08 DIAGNOSIS — S199XXA Unspecified injury of neck, initial encounter: Secondary | ICD-10-CM | POA: Diagnosis present

## 2021-12-08 DIAGNOSIS — S29019A Strain of muscle and tendon of unspecified wall of thorax, initial encounter: Secondary | ICD-10-CM

## 2021-12-08 DIAGNOSIS — M25512 Pain in left shoulder: Secondary | ICD-10-CM

## 2021-12-08 DIAGNOSIS — S29012A Strain of muscle and tendon of back wall of thorax, initial encounter: Secondary | ICD-10-CM | POA: Insufficient documentation

## 2021-12-08 LAB — POC URINE PREG, ED: Preg Test, Ur: NEGATIVE

## 2021-12-08 MED ORDER — METHOCARBAMOL 500 MG PO TABS: 500.0000 mg | ORAL_TABLET | Freq: Four times a day (QID) | ORAL | 0 refills | Status: DC | PRN

## 2021-12-08 MED ORDER — IBUPROFEN 600 MG PO TABS
600.0000 mg | ORAL_TABLET | Freq: Four times a day (QID) | ORAL | 0 refills | Status: DC | PRN
Start: 2021-12-08 — End: 2022-03-15
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
## 2021-12-08 NOTE — Discharge Instructions (Signed)
Follow-up with your primary care provider if any continued problems or concerns.  Begin taking the ibuprofen 600 mg every 6 hours with food as needed for muscle stiffness and soreness.  The methocarbamol as muscle relaxant to be taken every 6 hours for muscle spasms.  Do not take this medication if you plan on driving or operating machinery as it could cause drowsiness.  You may use ice or heat to your muscles as needed for discomfort.

## 2021-12-13 ENCOUNTER — Ambulatory Visit (INDEPENDENT_AMBULATORY_CARE_PROVIDER_SITE_OTHER): Payer: Self-pay | Admitting: Family Medicine

## 2021-12-13 ENCOUNTER — Encounter: Payer: Self-pay | Admitting: Family Medicine

## 2021-12-13 VITALS — BP 100/70 | HR 74 | Temp 98.7°F | Ht 62.0 in | Wt 175.1 lb

## 2021-12-13 DIAGNOSIS — S46812D Strain of other muscles, fascia and tendons at shoulder and upper arm level, left arm, subsequent encounter: Secondary | ICD-10-CM

## 2021-12-13 DIAGNOSIS — S46819A Strain of other muscles, fascia and tendons at shoulder and upper arm level, unspecified arm, initial encounter: Secondary | ICD-10-CM | POA: Insufficient documentation

## 2021-12-13 DIAGNOSIS — S46819D Strain of other muscles, fascia and tendons at shoulder and upper arm level, unspecified arm, subsequent encounter: Secondary | ICD-10-CM

## 2021-12-13 MED ORDER — CYCLOBENZAPRINE HCL 10 MG PO TABS: 5.0000 mg | ORAL_TABLET | Freq: Every evening | ORAL | 0 refills | Status: DC | PRN
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
## 2021-12-13 NOTE — Patient Instructions (Addendum)
Increase ibuprofen to 600-800mg   three times daily.  Start home upper back stretches.  Can use muscle relaxant at night as needed... try cyclobenzaprine instead.
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
## 2022-01-02 ENCOUNTER — Encounter: Payer: Self-pay | Admitting: Family Medicine

## 2022-01-02 DIAGNOSIS — S46819D Strain of other muscles, fascia and tendons at shoulder and upper arm level, unspecified arm, subsequent encounter: Secondary | ICD-10-CM

## 2022-01-21 ENCOUNTER — Encounter: Payer: Self-pay | Admitting: Family Medicine

## 2022-02-06 ENCOUNTER — Ambulatory Visit: Payer: Self-pay | Admitting: Family Medicine

## 2022-03-11 ENCOUNTER — Encounter: Payer: Self-pay | Admitting: Family Medicine

## 2022-03-15 ENCOUNTER — Encounter: Payer: Self-pay | Admitting: Family Medicine

## 2022-03-15 ENCOUNTER — Ambulatory Visit: Payer: Managed Care, Other (non HMO) | Admitting: Family Medicine

## 2022-03-15 VITALS — BP 100/68 | HR 81 | Temp 97.9°F | Ht 62.0 in | Wt 179.4 lb

## 2022-03-15 DIAGNOSIS — M542 Cervicalgia: Secondary | ICD-10-CM

## 2022-03-15 DIAGNOSIS — R519 Headache, unspecified: Secondary | ICD-10-CM | POA: Insufficient documentation

## 2022-03-15 DIAGNOSIS — G8929 Other chronic pain: Secondary | ICD-10-CM | POA: Diagnosis not present

## 2022-03-15 MED ORDER — KETOROLAC TROMETHAMINE 60 MG/2ML IM SOLN
60.0000 mg | Freq: Once | INTRAMUSCULAR | Status: AC
  Administered 2022-03-15: 60 mg via INTRAMUSCULAR

## 2022-03-15 NOTE — Addendum Note (Signed)
Addended by: Carter Kitten on: 03/15/2022 09:38 AM   Modules accepted: Orders

## 2022-03-15 NOTE — Patient Instructions (Signed)
Push fluids.  Continue upper back/neck home physical therapy.  Call if headache or neck pain not improving for trial of prednisone course to treat vs stronger antiinflammatory.
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
## 2022-03-21 ENCOUNTER — Other Ambulatory Visit: Payer: Self-pay

## 2022-03-21 ENCOUNTER — Emergency Department
Admission: EM | Admit: 2022-03-21 | Discharge: 2022-03-22 | Disposition: A | Discharge status: Home / Self Care | Payer: Managed Care, Other (non HMO) | Attending: Emergency Medicine | Admitting: Emergency Medicine

## 2022-03-21 ENCOUNTER — Emergency Department: Payer: Managed Care, Other (non HMO)

## 2022-03-21 DIAGNOSIS — R519 Headache, unspecified: Secondary | ICD-10-CM | POA: Diagnosis present

## 2022-03-21 DIAGNOSIS — R202 Paresthesia of skin: Secondary | ICD-10-CM | POA: Diagnosis not present

## 2022-03-21 LAB — COMPREHENSIVE METABOLIC PANEL
ALT: 17 U/L (ref 0–44)
AST: 22 U/L (ref 15–41)
Albumin: 4 g/dL (ref 3.5–5.0)
Alkaline Phosphatase: 54 U/L (ref 38–126)
Anion gap: 8 (ref 5–15)
BUN: 15 mg/dL (ref 6–20)
CO2: 23 mmol/L (ref 22–32)
Calcium: 8.7 mg/dL — ABNORMAL LOW (ref 8.9–10.3)
Chloride: 102 mmol/L (ref 98–111)
Creatinine, Ser: 0.96 mg/dL (ref 0.44–1.00)
GFR, Estimated: >60 mL/min (ref >60)
Glucose, Bld: 103 mg/dL — ABNORMAL HIGH (ref 70–99)
Potassium: 4.1 mmol/L (ref 3.5–5.1)
Sodium: 133 mmol/L — ABNORMAL LOW (ref 135–145)
Total Bilirubin: 0.6 mg/dL (ref 0.3–1.2)
Total Protein: 7 g/dL (ref 6.5–8.1)

## 2022-03-21 LAB — LIPASE, BLOOD: Lipase: 37 U/L (ref 11–51)

## 2022-03-21 LAB — URINALYSIS, ROUTINE W REFLEX MICROSCOPIC
Bilirubin Urine: NEGATIVE
Glucose, UA: NEGATIVE mg/dL
Hgb urine dipstick: NEGATIVE
Ketones, ur: NEGATIVE mg/dL
Leukocytes,Ua: NEGATIVE
Nitrite: NEGATIVE
Protein, ur: NEGATIVE mg/dL
Specific Gravity, Urine: 1.016 (ref 1.005–1.030)
pH: 5 (ref 5.0–8.0)

## 2022-03-21 LAB — CBC WITH DIFFERENTIAL/PLATELET
Abs Immature Granulocytes: 0.02 10*3/uL (ref 0.00–0.07)
Basophils Absolute: 0.1 10*3/uL (ref 0.0–0.1)
Basophils Relative: 1 %
Eosinophils Absolute: 0.1 10*3/uL (ref 0.0–0.5)
Eosinophils Relative: 2 %
HCT: 39.4 % (ref 36.0–46.0)
Hemoglobin: 12.4 g/dL (ref 12.0–15.0)
Immature Granulocytes: 0 %
Lymphocytes Relative: 45 %
Lymphs Abs: 2.8 10*3/uL (ref 0.7–4.0)
MCH: 25.4 pg — ABNORMAL LOW (ref 26.0–34.0)
MCHC: 31.5 g/dL (ref 30.0–36.0)
MCV: 80.7 fL (ref 80.0–100.0)
Monocytes Absolute: 0.6 10*3/uL (ref 0.1–1.0)
Monocytes Relative: 10 %
Neutro Abs: 2.6 10*3/uL (ref 1.7–7.7)
Neutrophils Relative %: 42 %
Platelets: 224 10*3/uL (ref 150–400)
RBC: 4.88 MIL/uL (ref 3.87–5.11)
RDW: 12.9 % (ref 11.5–15.5)
WBC: 6.2 10*3/uL (ref 4.0–10.5)
nRBC: 0 % (ref 0.0–0.2)

## 2022-03-21 MED ORDER — IOHEXOL 350 MG/ML SOLN
75.0000 mL | Freq: Once | INTRAVENOUS | Status: AC | PRN
  Administered 2022-03-21: 75 mL via INTRAVENOUS

## 2022-03-21 MED ORDER — LORAZEPAM 2 MG/ML PO CONC
1.0000 mg | Freq: Once | ORAL | Status: AC
  Administered 2022-03-21: 1 mg via ORAL
  Filled 2022-03-21: qty 0.5

## 2022-03-21 MED ORDER — KETOROLAC TROMETHAMINE 30 MG/ML IJ SOLN: 30.0000 mg | Freq: Once | INTRAMUSCULAR | Status: DC

## 2022-03-21 NOTE — ED Notes (Signed)
See triage note  Presents with h/a for the past 17 days  States she developed min swelling to right ankle today  and slight numbness to right hand today  Denies any fever or n/v  Has been seen by PCP for same  Was given Toradol in the office  No relief
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
## 2022-03-21 NOTE — ED Notes (Addendum)
Patient transported to CT 
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
## 2022-03-21 NOTE — ED Notes (Signed)
Patient transported to MRI 

## 2022-03-21 NOTE — ED Notes (Signed)
Patient transported back from CT 

## 2022-03-22 ENCOUNTER — Telehealth: Payer: Self-pay

## 2022-03-22 MED ORDER — KETOROLAC TROMETHAMINE 30 MG/ML IJ SOLN
30.0000 mg | Freq: Once | INTRAMUSCULAR | Status: AC
  Administered 2022-03-22: 30 mg via INTRAVENOUS
  Filled 2022-03-22: qty 1

## 2022-03-22 NOTE — Transitions of Care (Post Inpatient/ED Visit) (Signed)
   03/22/2022  Name: Cassadie Pettee MRN: [id number] DOB: [birth year]  Today's TOC FU Call Status:  Unsuccessful Call (1st attempt)  Attempted to reach the patient regarding the most recent Inpatient/ED visit.  Follow Up Plan: Additional outreach attempts will be made to reach the patient to complete the Transitions of Care (Post Inpatient/ED visit) call.   Signature Madelyn Brunner, LPN

## 2022-03-22 NOTE — Discharge Instructions (Signed)
Please check blood pressure once weekly.

## 2022-03-25 NOTE — Transitions of Care (Post Inpatient/ED Visit) (Signed)
   03/25/2022  Name: Terry Camacho MRN: [id number] DOB: 07/17/1975  Today's TOC FU Call Status:    Attempted to reach the patient regarding the most recent Inpatient/ED visit.  Follow Up Plan: Additional outreach attempts will be made to reach the patient to complete the Transitions of Care (Post Inpatient/ED visit) call.   Signature Francella Solian, CMA

## 2022-03-25 NOTE — Transitions of Care (Post Inpatient/ED Visit) (Signed)
   03/25/2022  Name: Sham Coral MRN: [id number] DOB: 13-Dec-1975  Today's TOC FU Call Status: Today's TOC FU Call Status:: Successful TOC FU Call Competed TOC FU Call Complete Date: 03/25/22  Transition Care Management Follow-up Telephone Call Date of Discharge: 03/21/22 Discharge Facility: [facility]) Type of Discharge: Emergency Department Reason for ED Visit: Other: (head ache and neck pain with numbness in right hand) How have you been since you were released from the hospital?: Better Any questions or concerns?: Yes Patient Questions/Concerns:: would like to have more information about CT Patient Questions/Concerns Addressed: Notified Provider of Patient Questions/Concerns  Items Reviewed: Did you receive and understand the discharge instructions provided?: Yes Medications obtained and verified?: Yes (Medications Reviewed) Any new allergies since your discharge?: Yes Dietary orders reviewed?: NA Do you have support at home?: Yes People in Home: spouse Name of Support/Comfort Primary Source: [place] and Equipment/Supplies: [place] Ordered?: NA Any new equipment or medical supplies ordered?: NA  Functional Questionnaire: Do you need assistance with bathing/showering or dressing?: No Do you need assistance with meal preparation?: No Do you need assistance with eating?: No Do you have difficulty maintaining continence: No Do you need assistance with getting out of bed/getting out of a chair/moving?: No Do you have difficulty managing or taking your medications?: No  Folllow up appointments reviewed: PCP Follow-up appointment confirmed?: Yes Date of PCP follow-up appointment?: 03/29/22 Follow-up Provider: [facility] Follow-up appointment confirmed?: NA Do you need transportation to your follow-up appointment?: No Do you understand care options if your condition(s) worsen?: Yes-patient verbalized  understanding    [place], [place]
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
## 2022-03-29 ENCOUNTER — Ambulatory Visit (INDEPENDENT_AMBULATORY_CARE_PROVIDER_SITE_OTHER): Payer: Managed Care, Other (non HMO) | Admitting: Neurology

## 2022-03-29 ENCOUNTER — Ambulatory Visit: Payer: Managed Care, Other (non HMO) | Admitting: Family Medicine

## 2022-03-29 ENCOUNTER — Encounter: Payer: Self-pay | Admitting: Neurology

## 2022-03-29 VITALS — BP 118/79 | HR 88 | Ht 62.0 in | Wt 178.0 lb

## 2022-03-29 DIAGNOSIS — G4486 Cervicogenic headache: Secondary | ICD-10-CM | POA: Diagnosis not present

## 2022-03-29 MED ORDER — NORTRIPTYLINE HCL 10 MG PO CAPS: 10.0000 mg | ORAL_CAPSULE | Freq: Every day | ORAL | 5 refills | Status: DC

## 2022-03-29 NOTE — Patient Instructions (Signed)
Start nortriptyline '10mg'$  at bedtime.  If no improvement in 4 weeks (prior to refill), contact me and we can increase dose.  Physical therapy for neck pain/cervicogenic headache Limit use of pain relievers to no more than 2 days out of week to prevent risk of rebound or medication-overuse headache. Follow up 4-5 months.

## 2022-05-08 ENCOUNTER — Other Ambulatory Visit: Payer: Self-pay | Admitting: Obstetrics and Gynecology

## 2022-05-08 DIAGNOSIS — Z1231 Encounter for screening mammogram for malignant neoplasm of breast: Secondary | ICD-10-CM

## 2022-05-15 ENCOUNTER — Inpatient Hospital Stay
Admission: RE | Admit: 2022-05-15 | Discharge: 2022-05-15 | Disposition: A | Discharge status: Home / Self Care | Payer: Self-pay | Source: Ambulatory Visit | Attending: Family Medicine | Admitting: Family Medicine

## 2022-05-15 ENCOUNTER — Other Ambulatory Visit: Payer: Self-pay | Admitting: *Deleted

## 2022-05-15 DIAGNOSIS — Z1231 Encounter for screening mammogram for malignant neoplasm of breast: Secondary | ICD-10-CM

## 2022-05-17 ENCOUNTER — Ambulatory Visit
Admission: RE | Admit: 2022-05-17 | Discharge: 2022-05-17 | Disposition: A | Discharge status: Home / Self Care | Payer: Managed Care, Other (non HMO) | Source: Ambulatory Visit | Attending: Obstetrics and Gynecology | Admitting: Obstetrics and Gynecology

## 2022-05-17 DIAGNOSIS — Z1231 Encounter for screening mammogram for malignant neoplasm of breast: Secondary | ICD-10-CM | POA: Diagnosis present
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
## 2022-08-07 ENCOUNTER — Ambulatory Visit: Payer: Managed Care, Other (non HMO) | Admitting: Neurology

## 2022-09-11 ENCOUNTER — Other Ambulatory Visit: Payer: Self-pay | Admitting: Physician Assistant

## 2022-09-11 ENCOUNTER — Ambulatory Visit
Admission: RE | Admit: 2022-09-11 | Discharge: 2022-09-11 | Disposition: A | Discharge status: Home / Self Care | Payer: PRIVATE HEALTH INSURANCE | Source: Ambulatory Visit | Attending: Physician Assistant | Admitting: Physician Assistant

## 2022-09-11 ENCOUNTER — Encounter: Payer: Self-pay | Admitting: *Deleted

## 2022-09-11 DIAGNOSIS — Y990 Civilian activity done for income or pay: Secondary | ICD-10-CM

## 2022-09-11 DIAGNOSIS — M19012 Primary osteoarthritis, left shoulder: Secondary | ICD-10-CM | POA: Diagnosis present

## 2022-09-25 HISTORY — PX: OTHER SURGICAL HISTORY: SHX169
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
## 2022-11-19 ENCOUNTER — Ambulatory Visit: Payer: Managed Care, Other (non HMO) | Admitting: Neurology

## 2022-11-20 ENCOUNTER — Encounter: Payer: Self-pay | Admitting: Physical Therapy

## 2022-11-20 ENCOUNTER — Ambulatory Visit: Payer: Managed Care, Other (non HMO) | Attending: Orthopedic Surgery | Admitting: Physical Therapy

## 2022-11-20 ENCOUNTER — Other Ambulatory Visit: Payer: Self-pay

## 2022-11-20 DIAGNOSIS — M25512 Pain in left shoulder: Secondary | ICD-10-CM | POA: Insufficient documentation

## 2022-11-20 DIAGNOSIS — G8929 Other chronic pain: Secondary | ICD-10-CM | POA: Diagnosis present

## 2022-11-20 DIAGNOSIS — R29898 Other symptoms and signs involving the musculoskeletal system: Secondary | ICD-10-CM | POA: Insufficient documentation

## 2022-11-20 NOTE — Therapy (Signed)
OUTPATIENT PHYSICAL THERAPY SHOULDER EVALUATION   Patient Name: Terry Camacho MRN: [id number] DOB:02/14/1975, 47 y.o., female Today's Date: 11/20/2022  END OF SESSION:  PT End of Session - 11/20/22 1020     Visit Number 1    Number of Visits 24    Date for PT Re-Evaluation 02/12/23    Authorization Type [employer]    PT Start Time 1018    PT Stop Time 1105    PT Time Calculation (min) 47 min             Past Medical History:  Diagnosis Date   GERD (gastroesophageal reflux disease)    Hiatal hernia    Palpitations 11/18/2014   Past Surgical History:  Procedure Laterality Date   ESOPHAGOGASTRODUODENOSCOPY  10/14/2005   esophagitis   L shoulder rotator cuff repair Left 09/25/2022   Patient Active Problem List   Diagnosis Date Noted   Acute intractable headache 03/15/2022   Chronic neck pain 03/15/2022   Trapezius strain 12/13/2021   Nontraumatic complete tear of right rotator cuff 08/14/2021   MDD (major depressive disorder), single episode, mild (HCC) 04/02/2019   Family history of thyroid disease 03/19/2018   Class 1 obesity without serious comorbidity with body mass index (BMI) of 31.0 to 31.9 in adult 05/07/2016   History of palpitations 11/18/2014   GERD 04/28/2007    PCP: Excell Seltzer, MD  REFERRING PROVIDER: Teryl Lucy, MD   REFERRING DIAG: s/p L shoulder SAD, RCR and bicep tenodesis  THERAPY DIAG:  Chronic left shoulder pain  Weakness of left shoulder  Rationale for Evaluation and Treatment: Rehabilitation  ONSET DATE: 09/25/2022  SUBJECTIVE:                                                                                                                                                                                      SUBJECTIVE STATEMENT: Pt has surgery fo RCR/ Bicep tenodesis and SAD on 09/25/2022. Saw MD on 10/2 and was released from the sling when saw him. Today marks 8 weeks post op. She has not been given any exercise to do before  coming into today before PT. Pain stays in the shoulder and can fluctuate. Prior to this she no hx of surgery. Initally injury occurred at 4-5 years ago, and had reinjury helping a co-working.   Hand dominance: Right  PERTINENT HISTORY: See hx  PAIN:  Are you having pain? Yes: NPRS scale: 7/10, at worst 9/10 Pain location: L shoulder,  Pain description: throbbing,  Aggravating factors: anything a sudden pain Relieving factors: heating pad, ibuprofen and resting.   PRECAUTIONS: Shoulder  RED FLAGS: None   WEIGHT BEARING RESTRICTIONS: Yes  NWB   FALLS:  Has patient fallen in last 6 months? No  LIVING ENVIRONMENT: Lives with: lives with their family Lives in: House/apartment Stairs: Yes: Internal: 17 steps; on right going up Has following equipment at home:  sling  OCCUPATION: Facilities manager  PLOF: Independent  PATIENT GOALS: lift arm and get it moving like it should.   NEXT MD VISIT: 12/04/2022  OBJECTIVE:  Note: Objective measures were completed at Evaluation unless otherwise noted.  DIAGNOSTIC FINDINGS:  At MD's office.   PATIENT SURVEYS:  FOTO 12% and predicted 57%  COGNITION: Overall cognitive status: Within functional limits for tasks assessed     SENSATION: WFL  POSTURE: FWD  UPPER EXTREMITY ROM:   Passive ROM  Right eval Left eval  Shoulder flexion  40 P!  Shoulder extension    Shoulder abduction  46 P!  Shoulder adduction    Shoulder internal rotation    Shoulder external rotation  4 P!  Elbow flexion  134  Elbow extension  2  Wrist flexion    Wrist extension    Wrist ulnar deviation    Wrist radial deviation    Wrist pronation    Wrist supination    (Blank rows = not tested)  Notes: Pain and guarding noted during assessment specifically with shoulder movement and elbow extension/ flexion requiring use of distraction technique of pressing LE calf/ shin together.   UPPER EXTREMITY MMT:  MMT Right eval Left eval  Shoulder  flexion    Shoulder extension    Shoulder abduction    Shoulder adduction    Shoulder internal rotation    Shoulder external rotation    Middle trapezius    Lower trapezius    Elbow flexion    Elbow extension    Wrist flexion    Wrist extension    Wrist ulnar deviation    Wrist radial deviation    Wrist pronation    Wrist supination    Grip strength (lbs)    (Blank rows = not tested)  JOINT MOBILITY TESTING:  Gross guarding and stiffness noted mpacting mobility assessment  PALPATION:  TTP along the anteior aspect of the shoulder along the mid/ proximal bicep brachii,  anterior deltoid, middle deltoid, sub-acromial space, as well as the infrapsinatus. Trigger points noted int he L upper trap / levator scapulae.    TODAY'S TREATMENT:                                                                                                                                         [facility] Adult PT Treatment:                                                DATE: 11/20/2022 Therapeutic Exercise: Supine elbow flexion/  extension 1 x 10 L upper trap/ levator scapulae stretch 1 x 30 sec ea Seated table AAROM slides flexion  Provided initial HEP  PATIENT EDUCATION: Education details: Evaluation findings, POC, goals, HEP with proper form/ rationale.  Person educated: Patient Education method: Explanation, Verbal cues, and Handouts Education comprehension: verbalized understanding  HOME EXERCISE PROGRAM: Access Code: [email or web address] URL: [email or web address] Date: 11/20/2022 Prepared by: Lulu Riding  Exercises - Seated Upper Trapezius Stretch  - 1 x daily - 7 x weekly - 2 sets - 2 reps - 30 seconds hold - Gentle Levator Scapulae Stretch  - 1 x daily - 7 x weekly - 2 sets - 2 reps - 30 seconds hold - Shoulder Table Slides Flexion  - 2 x daily - 3 sets - 10 reps - 5 hold - Shoulder table slides ABD  - 2 x daily - 3 sets - 10 reps - 5 hold - Seated Gripping Towel  - 1 x daily - 7 x  weekly - 2 sets - 10 reps - Supine Elbow Flexion Extension AROM  - 1 x daily - 7 x weekly - 2 sets - 10 reps  ASSESSMENT:  CLINICAL IMPRESSION: Patient is a 47 y.o. F who was seen today for physical therapy evaluation and treatment for L shoulder s/p RCR, Bicep Tenodesis and SAD on 09/25/22. She is 8 weeks post op today and saw her MD on 10/2 releasing her from her sling. She has limited PROM in all planes secondary to pain and guarding, MMT was withheld due to precautions. Discussed letting her arm hang down vs keeping  her elbow flexed at her side. Provided initial HEP  and pt noted soreness had reduced compared to when she arrived. She would benefit from physical therapy to reduce L shoulder pain, increase ROM and strength, improve LUE strength and overall function by addressing the deficits listed.    OBJECTIVE IMPAIRMENTS: decreased activity tolerance, decreased endurance, decreased ROM, decreased strength, increased edema, increased fascial restrictions, increased muscle spasms, impaired flexibility, impaired UE functional use, improper body mechanics, postural dysfunction, and pain.   ACTIVITY LIMITATIONS: carrying, lifting, and reach over head  PARTICIPATION LIMITATIONS: cleaning, laundry, community activity, occupation, and Exercise  PERSONAL FACTORS: Past/current experiences and Time since onset of injury/illness/exacerbation are also affecting patient's functional outcome.   REHAB POTENTIAL: Excellent  CLINICAL DECISION MAKING: Stable/uncomplicated  EVALUATION COMPLEXITY: Low   GOALS: Goals reviewed with patient? Yes  SHORT TERM GOALS: Target date: 01/01/2023    Pt to be IND with initial HEP for therapeutic progression Baseline: Goal status: INITIAL  2.  Increase L shoulder AAROM Flexion/ abduction to >/= 120 degrees with </= 5/10 pain for progression of ROM Baseline:  Goal status: INITIAL  3.  Improve FOTO score to >/= 30% to demonstrate improving function Baseline:   Goal status: INITIAL  4.  Pt to demonstrate LUE strength to >/= 3/5 to demonstrate progressing strength and function Baseline:  Goal status: INITIAL  LONG TERM GOALS: Target date: 02/12/2023   Pt have functional L shoulder AROM in all planes compared bil with no report of pain or limitatoins Baseline:  Goal status: INITIAL  2.  Pt to be a lift and lower >/= 10# to and from a overhead shelf with </= 2/10 pain or functional strength required for Adls Baseline:  Goal status: INITIAL  3.  Pt to be able to lift/ lower >/= 20# to / from waist to floor for functional lifting and strengthening Baseline:  Goal status: INITIAL  4.  Pt be able to push/ pull >/= 20# with no report of pain or restrictions and be able to return to personal workout routine with gradual progression without pain Baseline:  Goal status: INITIAL  5.  Pt to increase FOTO score to >/= 57% to demonstrate improvement in function Baseline:  Goal status: INITIAL  6.  Pt to be IND with all HEP and able to maintain and progress current LOF IND  Baseline:  Goal status: INITIAL  PLAN:  PT FREQUENCY: 1-2x/week  PT DURATION: 12 weeks  PLANNED INTERVENTIONS: 97164- PT Re-evaluation, 97110-Therapeutic exercises, 97530- Therapeutic activity, [id number]- Neuromuscular re-education, 97535- Self Care, 29562- Manual therapy, [id number]- Aquatic Therapy, 97014- Electrical stimulation (unattended), 97016- Vasopneumatic device, Taping, Dry Needling, Joint mobilization, Scar mobilization, Cryotherapy, and Moist heat  PLAN FOR NEXT SESSION: review/ update HEP PRN. 8 weeks out at eval, shoulder mobs, PROM/ AAROM, STW in the shoulder/ bicep, begin gentile isometrics.   Franciszek Platten PT, DPT, LAT, ATC  11/20/22  12:20 PM

## 2022-12-02 ENCOUNTER — Ambulatory Visit: Payer: Managed Care, Other (non HMO) | Admitting: Physical Therapy

## 2022-12-02 ENCOUNTER — Encounter: Payer: Self-pay | Admitting: Physical Therapy

## 2022-12-02 DIAGNOSIS — G8929 Other chronic pain: Secondary | ICD-10-CM

## 2022-12-02 DIAGNOSIS — R29898 Other symptoms and signs involving the musculoskeletal system: Secondary | ICD-10-CM

## 2022-12-02 DIAGNOSIS — M25512 Pain in left shoulder: Secondary | ICD-10-CM | POA: Diagnosis not present

## 2022-12-02 NOTE — Therapy (Signed)
OUTPATIENT PHYSICAL THERAPY TREATMENT   Patient Name: Naliya Kisler MRN: [id number] DOB:May 30, 1975, 47 y.o., female Today's Date: 12/02/2022  END OF SESSION:  PT End of Session - 12/02/22 1421     Visit Number 2    Number of Visits 24    Date for PT Re-Evaluation 02/12/23    Authorization Type [employer]    PT Start Time 1418    PT Stop Time 1505    PT Time Calculation (min) 47 min    Activity Tolerance Patient tolerated treatment well    Behavior During Therapy [facility] for tasks assessed/performed              Past Medical History:  Diagnosis Date   GERD (gastroesophageal reflux disease)    Hiatal hernia    Palpitations 11/18/2014   Past Surgical History:  Procedure Laterality Date   ESOPHAGOGASTRODUODENOSCOPY  10/14/2005   esophagitis   L shoulder rotator cuff repair Left 09/25/2022   Patient Active Problem List   Diagnosis Date Noted   Acute intractable headache 03/15/2022   Chronic neck pain 03/15/2022   Trapezius strain 12/13/2021   Nontraumatic complete tear of right rotator cuff 08/14/2021   MDD (major depressive disorder), single episode, mild (HCC) 04/02/2019   Family history of thyroid disease 03/19/2018   Class 1 obesity without serious comorbidity with body mass index (BMI) of 31.0 to 31.9 in adult 05/07/2016   History of palpitations 11/18/2014   GERD 04/28/2007    PCP: Excell Seltzer, MD  REFERRING PROVIDER: Teryl Lucy, MD   REFERRING DIAG: s/p L shoulder SAD, RCR and bicep tenodesis  THERAPY DIAG:  Chronic left shoulder pain  Weakness of left shoulder  Rationale for Evaluation and Treatment: Rehabilitation  ONSET DATE: 09/25/2022  SUBJECTIVE:                                                                                                                                                                                      SUBJECTIVE STATEMENT: "I am feeling still pretty sore every day, doing exercise exactly how the HEP is laid out  and see the MD on 10/30."  PERTINENT HISTORY: See hx  PAIN:  Are you having pain? Yes: NPRS scale:6/10 Pain location: L shoulder,  Pain description: throbbing,  Aggravating factors: anything a sudden pain Relieving factors: heating pad, ibuprofen and resting.   PRECAUTIONS: Shoulder  RED FLAGS: None   WEIGHT BEARING RESTRICTIONS: Yes NWB   FALLS:  Has patient fallen in last 6 months? No  LIVING ENVIRONMENT: Lives with: lives with their family Lives in: House/apartment Stairs: Yes: Internal: 17 steps; on right going up Has following equipment at home:  sling  OCCUPATION: Facilities manager  PLOF: Independent  PATIENT GOALS: lift arm and get it moving like it should.   NEXT MD VISIT: 12/04/2022  OBJECTIVE:  Note: Objective measures were completed at Evaluation unless otherwise noted.  DIAGNOSTIC FINDINGS:  At MD's office.   PATIENT SURVEYS:  FOTO 12% and predicted 57%  COGNITION: Overall cognitive status: Within functional limits for tasks assessed     SENSATION: WFL  POSTURE: FWD  UPPER EXTREMITY ROM:   Passive ROM  Right eval Left eval  Shoulder flexion  40 P!  Shoulder extension    Shoulder abduction  46 P!  Shoulder adduction    Shoulder internal rotation    Shoulder external rotation  4 P!  Elbow flexion  134  Elbow extension  2  Wrist flexion    Wrist extension    Wrist ulnar deviation    Wrist radial deviation    Wrist pronation    Wrist supination    (Blank rows = not tested)  Notes: Pain and guarding noted during assessment specifically with shoulder movement and elbow extension/ flexion requiring use of distraction technique of pressing LE calf/ shin together.   UPPER EXTREMITY MMT:  MMT Right eval Left eval  Shoulder flexion    Shoulder extension    Shoulder abduction    Shoulder adduction    Shoulder internal rotation    Shoulder external rotation    Middle trapezius    Lower trapezius    Elbow flexion    Elbow  extension    Wrist flexion    Wrist extension    Wrist ulnar deviation    Wrist radial deviation    Wrist pronation    Wrist supination    Grip strength (lbs)    (Blank rows = not tested)  JOINT MOBILITY TESTING:  Gross guarding and stiffness noted mpacting mobility assessment  PALPATION:  TTP along the anteior aspect of the shoulder along the mid/ proximal bicep brachii,  anterior deltoid, middle deltoid, sub-acromial space, as well as the infrapsinatus. Trigger points noted int he L upper trap / levator scapulae.    TODAY'S TREATMENT:                                                                                                                                         [facility] Adult PT Treatment:                                                DATE: 12/02/2022 Therapeutic Exercise: AAROM supine wand press AAROM wand ER - verbal cues for proper form - performed in both supine/ flexoin Nu-step with UE/LE L5 x verbal cues for gentle movement using legs and UE not to push with LUE but hold on to promote movement.  Updated HEP for wand flexion/ abduciton and ER Manual Therapy: GHJ PA/ AP mobs grade III, Distraction mobs grade III PROM flexion/ abduction working into end range with oscillations to calm down pain MTPR along the infraspinatus, upper trap, middle deltoid, biceps brachii   [facility] Adult PT Treatment:                                                DATE: 11/20/2022 Therapeutic Exercise: Supine elbow flexion/ extension 1 x 10 L upper trap/ levator scapulae stretch 1 x 30 sec ea Seated table AAROM slides flexion  Provided initial HEP  PATIENT EDUCATION: Education details: Evaluation findings, POC, goals, HEP with proper form/ rationale.  Person educated: Patient Education method: Explanation, Verbal cues, and Handouts Education comprehension: verbalized understanding  HOME EXERCISE PROGRAM: Access Code: [email or web address] URL: [email or web address] Date:  12/02/2022 Prepared by: Lulu Riding  Exercises - Seated Upper Trapezius Stretch  - 1 x daily - 7 x weekly - 2 sets - 2 reps - 30 seconds hold - Gentle Levator Scapulae Stretch  - 1 x daily - 7 x weekly - 2 sets - 2 reps - 30 seconds hold - Shoulder Table Slides Flexion  - 2 x daily - 3 sets - 10 reps - 5 hold - Shoulder table slides ABD  - 2 x daily - 3 sets - 10 reps - 5 hold - Seated Gripping Towel  - 1 x daily - 7 x weekly - 2 sets - 10 reps - Supine Elbow Flexion Extension AROM  - 1 x daily - 7 x weekly - 2 sets - 10 reps - Standing Shoulder External Rotation AAROM with Dowel  - 1 x daily - 7 x weekly - 2 sets - 10 reps - Shoulder Flexion Overhead with Dowel  - 1 x daily - 7 x weekly - 2 sets - 10 reps - Standing Shoulder Abduction AAROM with Dowel  - 1 x daily - 7 x weekly - 2 sets - 10 reps  ASSESSMENT:  CLINICAL IMPRESSION: 10/28/2024Mrs Shao presents to her follow up session reporting she is doing her HEP but does report 6/10 pain in the L shoulder that appears to be fairly constant. Reviewed her surgery and noted it is not uncommon to feel sore and that she is still healing. Continued working  on STW and PROM and GHJ mobs. Time was taken to discuss redirect her focus to reduce pain versus focusing on the shoulder and anticipating paint which she demonstrating improvement in session with during PROM treatment. End of session she noted pain reduced to 2/10.  Assessment at Evaluation: Patient is a 47 y.o. F who was seen today for physical therapy evaluation and treatment for L shoulder s/p RCR, Bicep Tenodesis and SAD on 09/25/22. She is 8 weeks post op today and saw her MD on 10/2 releasing her from her sling. She has limited PROM in all planes secondary to pain and guarding, MMT was withheld due to precautions. Discussed letting her arm hang down vs keeping  her elbow flexed at her side. Provided initial HEP  and pt noted soreness had reduced compared to when she arrived. She  would benefit from physical therapy to reduce L shoulder pain, increase ROM and strength, improve LUE strength and overall function by addressing the deficits listed.    OBJECTIVE IMPAIRMENTS: decreased activity tolerance, decreased endurance,  decreased ROM, decreased strength, increased edema, increased fascial restrictions, increased muscle spasms, impaired flexibility, impaired UE functional use, improper body mechanics, postural dysfunction, and pain.   ACTIVITY LIMITATIONS: carrying, lifting, and reach over head  PARTICIPATION LIMITATIONS: cleaning, laundry, community activity, occupation, and Exercise  PERSONAL FACTORS: Past/current experiences and Time since onset of injury/illness/exacerbation are also affecting patient's functional outcome.   REHAB POTENTIAL: Excellent  CLINICAL DECISION MAKING: Stable/uncomplicated  EVALUATION COMPLEXITY: Low   GOALS: Goals reviewed with patient? Yes  SHORT TERM GOALS: Target date: 01/01/2023    Pt to be IND with initial HEP for therapeutic progression Baseline: Goal status: INITIAL  2.  Increase L shoulder AAROM Flexion/ abduction to >/= 120 degrees with </= 5/10 pain for progression of ROM Baseline:  Goal status: INITIAL  3.  Improve FOTO score to >/= 30% to demonstrate improving function Baseline:  Goal status: INITIAL  4.  Pt to demonstrate LUE strength to >/= 3/5 to demonstrate progressing strength and function Baseline:  Goal status: INITIAL  LONG TERM GOALS: Target date: 02/12/2023   Pt have functional L shoulder AROM in all planes compared bil with no report of pain or limitatoins Baseline:  Goal status: INITIAL  2.  Pt to be a lift and lower >/= 10# to and from a overhead shelf with </= 2/10 pain or functional strength required for Adls Baseline:  Goal status: INITIAL  3.  Pt to be able to lift/ lower >/= 20# to / from waist to floor for functional lifting and strengthening Baseline:  Goal status: INITIAL  4.   Pt be able to push/ pull >/= 20# with no report of pain or restrictions and be able to return to personal workout routine with gradual progression without pain Baseline:  Goal status: INITIAL  5.  Pt to increase FOTO score to >/= 57% to demonstrate improvement in function Baseline:  Goal status: INITIAL  6.  Pt to be IND with all HEP and able to maintain and progress current LOF IND  Baseline:  Goal status: INITIAL  PLAN:  PT FREQUENCY: 1-2x/week  PT DURATION: 12 weeks  PLANNED INTERVENTIONS: 97164- PT Re-evaluation, 97110-Therapeutic exercises, 97530- Therapeutic activity, [id number]- Neuromuscular re-education, 97535- Self Care, 16109- Manual therapy, [id number]- Aquatic Therapy, 97014- Electrical stimulation (unattended), 97016- Vasopneumatic device, Taping, Dry Needling, Joint mobilization, Scar mobilization, Cryotherapy, and Moist heat  PLAN FOR NEXT SESSION: review/ update HEP PRN. 8 weeks out at eval, shoulder mobs, PROM/ AAROM, STW in the shoulder/ bicep, begin gentile isometrics. How was MD's visit on 10/30?  Nashla Althoff PT, DPT, LAT, ATC  12/02/22  3:43 PM

## 2022-12-11 ENCOUNTER — Ambulatory Visit: Payer: Managed Care, Other (non HMO) | Attending: Orthopedic Surgery | Admitting: Physical Therapy

## 2022-12-11 ENCOUNTER — Encounter: Payer: Self-pay | Admitting: Physical Therapy

## 2022-12-11 DIAGNOSIS — M25512 Pain in left shoulder: Secondary | ICD-10-CM | POA: Insufficient documentation

## 2022-12-11 DIAGNOSIS — G8929 Other chronic pain: Secondary | ICD-10-CM | POA: Insufficient documentation

## 2022-12-11 DIAGNOSIS — R29898 Other symptoms and signs involving the musculoskeletal system: Secondary | ICD-10-CM | POA: Insufficient documentation

## 2022-12-11 NOTE — Therapy (Signed)
OUTPATIENT PHYSICAL THERAPY TREATMENT   Patient Name: Terry Camacho MRN: [id number] DOB:1975/04/21, 47 y.o., female Today's Date: 12/11/2022  END OF SESSION:  PT End of Session - 12/11/22 1105     Visit Number 3    Number of Visits 24    Date for PT Re-Evaluation 02/12/23    Authorization Type [employer]    PT Start Time 1104    PT Stop Time 1148    PT Time Calculation (min) 44 min    Activity Tolerance Patient tolerated treatment well    Behavior During Therapy [facility] for tasks assessed/performed               Past Medical History:  Diagnosis Date   GERD (gastroesophageal reflux disease)    Hiatal hernia    Palpitations 11/18/2014   Past Surgical History:  Procedure Laterality Date   ESOPHAGOGASTRODUODENOSCOPY  10/14/2005   esophagitis   L shoulder rotator cuff repair Left 09/25/2022   Patient Active Problem List   Diagnosis Date Noted   Acute intractable headache 03/15/2022   Chronic neck pain 03/15/2022   Trapezius strain 12/13/2021   Nontraumatic complete tear of right rotator cuff 08/14/2021   MDD (major depressive disorder), single episode, mild (HCC) 04/02/2019   Family history of thyroid disease 03/19/2018   Class 1 obesity without serious comorbidity with body mass index (BMI) of 31.0 to 31.9 in adult 05/07/2016   History of palpitations 11/18/2014   GERD 04/28/2007    PCP: Excell Seltzer, MD  REFERRING PROVIDER: Teryl Lucy, MD   REFERRING DIAG: s/p L shoulder SAD, RCR and bicep tenodesis  THERAPY DIAG:  Chronic left shoulder pain  Weakness of left shoulder  Rationale for Evaluation and Treatment: Rehabilitation  ONSET DATE: 09/25/2022  SUBJECTIVE:                                                                                                                                                                                      SUBJECTIVE STATEMENT: "I saw the MD since the last session and he said we still have a way to go and process to  get the motion."  PERTINENT HISTORY: See hx  PAIN:  Are you having pain? Yes: NPRS scale:6-7/10 Pain location: L shoulder,  Pain description: throbbing,  Aggravating factors: anything a sudden pain Relieving factors: heating pad, ibuprofen and resting.   PRECAUTIONS: Shoulder  RED FLAGS: None   WEIGHT BEARING RESTRICTIONS: Yes NWB   FALLS:  Has patient fallen in last 6 months? No  LIVING ENVIRONMENT: Lives with: lives with their family Lives in: House/apartment Stairs: Yes: Internal: 17 steps; on right going up Has following equipment at home:  sling  OCCUPATION: Facilities manager  PLOF: Independent  PATIENT GOALS: lift arm and get it moving like it should.   NEXT MD VISIT: 12/04/2022  OBJECTIVE:  Note: Objective measures were completed at Evaluation unless otherwise noted.  DIAGNOSTIC FINDINGS:  At MD's office.   PATIENT SURVEYS:  FOTO 12% and predicted 57%  COGNITION: Overall cognitive status: Within functional limits for tasks assessed     SENSATION: WFL  POSTURE: FWD  UPPER EXTREMITY ROM:   Passive ROM  Right eval Left eval  Shoulder flexion  40 P!  Shoulder extension    Shoulder abduction  46 P!  Shoulder adduction    Shoulder internal rotation    Shoulder external rotation  4 P!  Elbow flexion  134  Elbow extension  2  Wrist flexion    Wrist extension    Wrist ulnar deviation    Wrist radial deviation    Wrist pronation    Wrist supination    (Blank rows = not tested)  Notes: Pain and guarding noted during assessment specifically with shoulder movement and elbow extension/ flexion requiring use of distraction technique of pressing LE calf/ shin together.   UPPER EXTREMITY MMT:  MMT Right eval Left eval  Shoulder flexion    Shoulder extension    Shoulder abduction    Shoulder adduction    Shoulder internal rotation    Shoulder external rotation    Middle trapezius    Lower trapezius    Elbow flexion    Elbow extension     Wrist flexion    Wrist extension    Wrist ulnar deviation    Wrist radial deviation    Wrist pronation    Wrist supination    Grip strength (lbs)    (Blank rows = not tested)  JOINT MOBILITY TESTING:  Gross guarding and stiffness noted mpacting mobility assessment  PALPATION:  TTP along the anteior aspect of the shoulder along the mid/ proximal bicep brachii,  anterior deltoid, middle deltoid, sub-acromial space, as well as the infrapsinatus. Trigger points noted int he L upper trap / levator scapulae.    TREATMENT:                                                                                                                                         [facility] Adult PT Treatment:                                                DATE: 12/11/2022 Therapeutic Exercise: Shoulder isometrics flexion/ abduction, extension, IR/ER 5 x holding 5 seconds. Cues / demonstration for proper from Updated HEP for isometrics  Manual Therapy: GHJ PA/ AP mobs grade III, Distraction mobs grade III PROM flexion/ abduction working into end range with oscillations to  calm down pain MTPR along the infraspinatus, upper trap, middle deltoid, biceps brachii  [facility] Adult PT Treatment:                                                DATE: 12/02/2022 Therapeutic Exercise: AAROM supine wand press AAROM wand ER - verbal cues for proper form - performed in both supine/ flexoin Nu-step with UE/LE L5 x verbal cues for gentle movement using legs and UE not to push with LUE but hold on to promote movement.   Updated HEP for wand flexion/ abduciton and ER Manual Therapy: GHJ PA/ AP mobs grade III, Distraction mobs grade III PROM flexion/ abduction working into end range with oscillations to calm down pain MTPR along the infraspinatus, upper trap, middle deltoid, biceps brachii   [facility] Adult PT Treatment:                                                DATE: 11/20/2022 Therapeutic Exercise: Supine elbow flexion/  extension 1 x 10 L upper trap/ levator scapulae stretch 1 x 30 sec ea Seated table AAROM slides flexion  Provided initial HEP  PATIENT EDUCATION: Education details: Evaluation findings, POC, goals, HEP with proper form/ rationale.  Person educated: Patient Education method: Explanation, Verbal cues, and Handouts Education comprehension: verbalized understanding  HOME EXERCISE PROGRAM: Access Code: [email or web address] URL: [email or web address] Date: 12/11/2022 Prepared by: Lulu Riding  Exercises - Seated Upper Trapezius Stretch  - 1 x daily - 7 x weekly - 2 sets - 2 reps - 30 seconds hold - Gentle Levator Scapulae Stretch  - 1 x daily - 7 x weekly - 2 sets - 2 reps - 30 seconds hold - Shoulder Table Slides Flexion  - 2 x daily - 3 sets - 10 reps - 5 hold - Shoulder table slides ABD  - 2 x daily - 3 sets - 10 reps - 5 hold - Seated Gripping Towel  - 1 x daily - 7 x weekly - 2 sets - 10 reps - Supine Elbow Flexion Extension AROM  - 1 x daily - 7 x weekly - 2 sets - 10 reps - Standing Shoulder External Rotation AAROM with Dowel  - 1 x daily - 7 x weekly - 2 sets - 10 reps - Shoulder Flexion Overhead with Dowel  - 1 x daily - 7 x weekly - 2 sets - 10 reps - Standing Shoulder Abduction AAROM with Dowel  - 1 x daily - 7 x weekly - 2 sets - 10 reps - Standing Isometric Shoulder Internal Rotation at Doorway (Mirrored)  - 1 x daily - 7 x weekly - 2 sets - 10 reps - 10 seconds hold - Standing Isometric Shoulder Flexion with Doorway - Arm Bent  - 1 x daily - 7 x weekly - 2 sets - 10 reps - 10 seconds hold - Standing Isometric Shoulder External Rotation with Doorway and Towel Roll  - 1 x daily - 7 x weekly - 2 sets - 10 reps - 10 seconds hold - Standing Isometric Shoulder Extension with Doorway - Arm Bent  - 1 x daily - 7 x weekly - 2 sets - 10 reps - seconds hold - Standing Isometric  Shoulder Abduction with Doorway - Arm Bent  - 1 x daily - 7 x weekly - 2 sets - 10 reps - 10 seconds  hold  ASSESSMENT:  CLINICAL IMPRESSION: 12/11/2022 Terry Camacho presents to therapy reporting continued fluctuating pain. She saw the MD since the previous session and reported that she progressing appropriately. Continued focus on manual to promote shoulder ROM. Began light isometrics today which were provided as HEP today.   Assessment at Evaluation: Patient is a 47 y.o. F who was seen today for physical therapy evaluation and treatment for L shoulder s/p RCR, Bicep Tenodesis and SAD on 09/25/22. She is 8 weeks post op today and saw her MD on 10/2 releasing her from her sling. She has limited PROM in all planes secondary to pain and guarding, MMT was withheld due to precautions. Discussed letting her arm hang down vs keeping  her elbow flexed at her side. Provided initial HEP  and pt noted soreness had reduced compared to when she arrived. She would benefit from physical therapy to reduce L shoulder pain, increase ROM and strength, improve LUE strength and overall function by addressing the deficits listed.    OBJECTIVE IMPAIRMENTS: decreased activity tolerance, decreased endurance, decreased ROM, decreased strength, increased edema, increased fascial restrictions, increased muscle spasms, impaired flexibility, impaired UE functional use, improper body mechanics, postural dysfunction, and pain.   ACTIVITY LIMITATIONS: carrying, lifting, and reach over head  PARTICIPATION LIMITATIONS: cleaning, laundry, community activity, occupation, and Exercise  PERSONAL FACTORS: Past/current experiences and Time since onset of injury/illness/exacerbation are also affecting patient's functional outcome.   REHAB POTENTIAL: Excellent  CLINICAL DECISION MAKING: Stable/uncomplicated  EVALUATION COMPLEXITY: Low   GOALS: Goals reviewed with patient? Yes  SHORT TERM GOALS: Target date: 01/01/2023    Pt to be IND with initial HEP for therapeutic progression Baseline: Goal status: INITIAL  2.  Increase  L shoulder AAROM Flexion/ abduction to >/= 120 degrees with </= 5/10 pain for progression of ROM Baseline:  Goal status: INITIAL  3.  Improve FOTO score to >/= 30% to demonstrate improving function Baseline:  Goal status: INITIAL  4.  Pt to demonstrate LUE strength to >/= 3/5 to demonstrate progressing strength and function Baseline:  Goal status: INITIAL  LONG TERM GOALS: Target date: 02/12/2023   Pt have functional L shoulder AROM in all planes compared bil with no report of pain or limitatoins Baseline:  Goal status: INITIAL  2.  Pt to be a lift and lower >/= 10# to and from a overhead shelf with </= 2/10 pain or functional strength required for Adls Baseline:  Goal status: INITIAL  3.  Pt to be able to lift/ lower >/= 20# to / from waist to floor for functional lifting and strengthening Baseline:  Goal status: INITIAL  4.  Pt be able to push/ pull >/= 20# with no report of pain or restrictions and be able to return to personal workout routine with gradual progression without pain Baseline:  Goal status: INITIAL  5.  Pt to increase FOTO score to >/= 57% to demonstrate improvement in function Baseline:  Goal status: INITIAL  6.  Pt to be IND with all HEP and able to maintain and progress current LOF IND  Baseline:  Goal status: INITIAL  PLAN:  PT FREQUENCY: 1-2x/week  PT DURATION: 12 weeks  PLANNED INTERVENTIONS: 97164- PT Re-evaluation, 97110-Therapeutic exercises, 97530- Therapeutic activity, [id number]- Neuromuscular re-education, 97535- Self Care, 16109- Manual therapy, [id number]- Aquatic Therapy, 97014- Electrical stimulation (unattended), 97016- Vasopneumatic device,  Taping, Dry Needling, Joint mobilization, Scar mobilization, Cryotherapy, and Moist heat  PLAN FOR NEXT SESSION: review/ update HEP PRN. 8 weeks out at eval, shoulder mobs, PROM/ AAROM, STW in the shoulder/ bicep, begin gentile isometrics. How was MD's visit on 10/30?  Vanesha Athens PT, DPT, LAT, ATC   12/11/22  12:54 PM

## 2022-12-13 ENCOUNTER — Ambulatory Visit: Payer: Managed Care, Other (non HMO) | Admitting: Physical Therapy

## 2022-12-13 ENCOUNTER — Encounter: Payer: Self-pay | Admitting: Physical Therapy

## 2022-12-13 DIAGNOSIS — R29898 Other symptoms and signs involving the musculoskeletal system: Secondary | ICD-10-CM

## 2022-12-13 DIAGNOSIS — M25512 Pain in left shoulder: Secondary | ICD-10-CM | POA: Diagnosis not present

## 2022-12-13 DIAGNOSIS — G8929 Other chronic pain: Secondary | ICD-10-CM

## 2022-12-13 NOTE — Therapy (Signed)
OUTPATIENT PHYSICAL THERAPY TREATMENT   Patient Name: Terry Camacho MRN: [id number] DOB:May 03, 1975, 47 y.o., female Today's Date: 12/13/2022  END OF SESSION:  PT End of Session - 12/13/22 1022     Visit Number 4    Number of Visits 24    Date for PT Re-Evaluation 02/12/23    Authorization Type [employer]    PT Start Time 1020    PT Stop Time 1058    PT Time Calculation (min) 38 min               Past Medical History:  Diagnosis Date   GERD (gastroesophageal reflux disease)    Hiatal hernia    Palpitations 11/18/2014   Past Surgical History:  Procedure Laterality Date   ESOPHAGOGASTRODUODENOSCOPY  10/14/2005   esophagitis   L shoulder rotator cuff repair Left 09/25/2022   Patient Active Problem List   Diagnosis Date Noted   Acute intractable headache 03/15/2022   Chronic neck pain 03/15/2022   Trapezius strain 12/13/2021   Nontraumatic complete tear of right rotator cuff 08/14/2021   MDD (major depressive disorder), single episode, mild (HCC) 04/02/2019   Family history of thyroid disease 03/19/2018   Class 1 obesity without serious comorbidity with body mass index (BMI) of 31.0 to 31.9 in adult 05/07/2016   History of palpitations 11/18/2014   GERD 04/28/2007    PCP: Excell Seltzer, MD  REFERRING PROVIDER: Teryl Lucy, MD   REFERRING DIAG: s/p L shoulder SAD, RCR and bicep tenodesis  THERAPY DIAG:  Chronic left shoulder pain  Weakness of left shoulder  Rationale for Evaluation and Treatment: Rehabilitation  ONSET DATE: 09/25/2022  SUBJECTIVE:                                                                                                                                                                                      SUBJECTIVE STATEMENT: "The shoulder still hurts, always a 6-7/10. Ice only helps when it is on. Pain comes back.    PERTINENT HISTORY: See hx  PAIN:  Are you having pain? Yes: NPRS scale:6-7/10 Pain location: L shoulder,   Pain description: throbbing,  Aggravating factors: anything a sudden pain Relieving factors: heating pad, ibuprofen and resting.   PRECAUTIONS: Shoulder  RED FLAGS: None   WEIGHT BEARING RESTRICTIONS: Yes NWB   FALLS:  Has patient fallen in last 6 months? No  LIVING ENVIRONMENT: Lives with: lives with their family Lives in: House/apartment Stairs: Yes: Internal: 17 steps; on right going up Has following equipment at home:  sling  OCCUPATION: Facilities manager  PLOF: Independent  PATIENT GOALS: lift arm and get it moving like it should.  NEXT MD VISIT: 12/04/2022  OBJECTIVE:  Note: Objective measures were completed at Evaluation unless otherwise noted.  DIAGNOSTIC FINDINGS:  At MD's office.   PATIENT SURVEYS:  FOTO 12% and predicted 57%  COGNITION: Overall cognitive status: Within functional limits for tasks assessed     SENSATION: WFL  POSTURE: FWD  UPPER EXTREMITY ROM:   Passive ROM  Right eval Left eval Left 12/13/22  Shoulder flexion  40 P! AROM 65 PROM 110  Shoulder extension     Shoulder abduction  46 P!   Shoulder adduction     Shoulder internal rotation     Shoulder external rotation  4 P!   Elbow flexion  134   Elbow extension  2   Wrist flexion     Wrist extension     Wrist ulnar deviation     Wrist radial deviation     Wrist pronation     Wrist supination     (Blank rows = not tested)  Notes: Pain and guarding noted during assessment specifically with shoulder movement and elbow extension/ flexion requiring use of distraction technique of pressing LE calf/ shin together.   UPPER EXTREMITY MMT:  MMT Right eval Left eval  Shoulder flexion    Shoulder extension    Shoulder abduction    Shoulder adduction    Shoulder internal rotation    Shoulder external rotation    Middle trapezius    Lower trapezius    Elbow flexion    Elbow extension    Wrist flexion    Wrist extension    Wrist ulnar deviation    Wrist radial  deviation    Wrist pronation    Wrist supination    Grip strength (lbs)    (Blank rows = not tested)  JOINT MOBILITY TESTING:  Gross guarding and stiffness noted mpacting mobility assessment  PALPATION:  TTP along the anteior aspect of the shoulder along the mid/ proximal bicep brachii,  anterior deltoid, middle deltoid, sub-acromial space, as well as the infrapsinatus. Trigger points noted int he L upper trap / levator scapulae.    TREATMENT:                                                                                                                                         [facility] Adult PT Treatment:                                                DATE: 12/13/22 Therapeutic Exercise: Supine AAROM ER Supine chest press dowel Supine Pullover dowel to tolerance  Review of isometric HEP 5-10 sec holds  Pendulums  Shoulder flexion walk backs at high table  Manual Therapy: GHJ PA/ AP mobs grade III, Distraction mobs grade III PROM flexion 110 , abdct  90, ER 15    [facility] Adult PT Treatment:                                                DATE: 12/11/2022 Therapeutic Exercise: Shoulder isometrics flexion/ abduction, extension, IR/ER 5 x holding 5 seconds. Cues / demonstration for proper from Updated HEP for isometrics  Manual Therapy: GHJ PA/ AP mobs grade III, Distraction mobs grade III PROM flexion/ abduction working into end range with oscillations to calm down pain MTPR along the infraspinatus, upper trap, middle deltoid, biceps brachii  [facility] Adult PT Treatment:                                                DATE: 12/02/2022 Therapeutic Exercise: AAROM supine wand press AAROM wand ER - verbal cues for proper form - performed in both supine/ flexoin Nu-step with UE/LE L5 x verbal cues for gentle movement using legs and UE not to push with LUE but hold on to promote movement.   Updated HEP for wand flexion/ abduciton and ER Manual Therapy: GHJ PA/ AP mobs grade III, Distraction  mobs grade III PROM flexion/ abduction working into end range with oscillations to calm down pain MTPR along the infraspinatus, upper trap, middle deltoid, biceps brachii   [facility] Adult PT Treatment:                                                DATE: 11/20/2022 Therapeutic Exercise: Supine elbow flexion/ extension 1 x 10 L upper trap/ levator scapulae stretch 1 x 30 sec ea Seated table AAROM slides flexion  Provided initial HEP  PATIENT EDUCATION: Education details: Evaluation findings, POC, goals, HEP with proper form/ rationale.  Person educated: Patient Education method: Explanation, Verbal cues, and Handouts Education comprehension: verbalized understanding  HOME EXERCISE PROGRAM: Access Code: [email or web address] URL: [email or web address] Date: 12/11/2022 Prepared by: Lulu Riding  Exercises - Seated Upper Trapezius Stretch  - 1 x daily - 7 x weekly - 2 sets - 2 reps - 30 seconds hold - Gentle Levator Scapulae Stretch  - 1 x daily - 7 x weekly - 2 sets - 2 reps - 30 seconds hold - Shoulder Table Slides Flexion  - 2 x daily - 3 sets - 10 reps - 5 hold - Shoulder table slides ABD  - 2 x daily - 3 sets - 10 reps - 5 hold - Seated Gripping Towel  - 1 x daily - 7 x weekly - 2 sets - 10 reps - Supine Elbow Flexion Extension AROM  - 1 x daily - 7 x weekly - 2 sets - 10 reps - Standing Shoulder External Rotation AAROM with Dowel  - 1 x daily - 7 x weekly - 2 sets - 10 reps - Shoulder Flexion Overhead with Dowel  - 1 x daily - 7 x weekly - 2 sets - 10 reps - Standing Shoulder Abduction AAROM with Dowel  - 1 x daily - 7 x weekly - 2 sets - 10 reps - Standing Isometric Shoulder Internal Rotation at Doorway (Mirrored)  - 1 x  daily - 7 x weekly - 2 sets - 10 reps - 10 seconds hold - Standing Isometric Shoulder Flexion with Doorway - Arm Bent  - 1 x daily - 7 x weekly - 2 sets - 10 reps - 10 seconds hold - Standing Isometric Shoulder External Rotation with Doorway and Towel Roll   - 1 x daily - 7 x weekly - 2 sets - 10 reps - 10 seconds hold - Standing Isometric Shoulder Extension with Doorway - Arm Bent  - 1 x daily - 7 x weekly - 2 sets - 10 reps - seconds hold - Standing Isometric Shoulder Abduction with Doorway - Arm Bent  - 1 x daily - 7 x weekly - 2 sets - 10 reps - 10 seconds hold  ASSESSMENT:  CLINICAL IMPRESSION: 12/13/2022 Mrs Stoves presents to therapy reporting continued fluctuating pain. She reports compliance and good tolerance with isometric HEP. Continued with PROM, AAROM and isometrics as tolerated. AROM flexion 65 degrees and PROM flexion 110 degrees.    Assessment at Evaluation: Patient is a 47 y.o. F who was seen today for physical therapy evaluation and treatment for L shoulder s/p RCR, Bicep Tenodesis and SAD on 09/26/22. She is 8 weeks post op today and saw her MD on 10/2 releasing her from her sling. She has limited PROM in all planes secondary to pain and guarding, MMT was withheld due to precautions. Discussed letting her arm hang down vs keeping  her elbow flexed at her side. Provided initial HEP  and pt noted soreness had reduced compared to when she arrived. She would benefit from physical therapy to reduce L shoulder pain, increase ROM and strength, improve LUE strength and overall function by addressing the deficits listed.    OBJECTIVE IMPAIRMENTS: decreased activity tolerance, decreased endurance, decreased ROM, decreased strength, increased edema, increased fascial restrictions, increased muscle spasms, impaired flexibility, impaired UE functional use, improper body mechanics, postural dysfunction, and pain.   ACTIVITY LIMITATIONS: carrying, lifting, and reach over head  PARTICIPATION LIMITATIONS: cleaning, laundry, community activity, occupation, and Exercise  PERSONAL FACTORS: Past/current experiences and Time since onset of injury/illness/exacerbation are also affecting patient's functional outcome.   REHAB POTENTIAL:  Excellent  CLINICAL DECISION MAKING: Stable/uncomplicated  EVALUATION COMPLEXITY: Low   GOALS: Goals reviewed with patient? Yes  SHORT TERM GOALS: Target date: 01/01/2023    Pt to be IND with initial HEP for therapeutic progression Baseline: Goal status: INITIAL  2.  Increase L shoulder AAROM Flexion/ abduction to >/= 120 degrees with </= 5/10 pain for progression of ROM Baseline:  Goal status: INITIAL  3.  Improve FOTO score to >/= 30% to demonstrate improving function Baseline:  Goal status: INITIAL  4.  Pt to demonstrate LUE strength to >/= 3/5 to demonstrate progressing strength and function Baseline:  Goal status: INITIAL  LONG TERM GOALS: Target date: 02/12/2023   Pt have functional L shoulder AROM in all planes compared bil with no report of pain or limitatoins Baseline:  Goal status: INITIAL  2.  Pt to be a lift and lower >/= 10# to and from a overhead shelf with </= 2/10 pain or functional strength required for Adls Baseline:  Goal status: INITIAL  3.  Pt to be able to lift/ lower >/= 20# to / from waist to floor for functional lifting and strengthening Baseline:  Goal status: INITIAL  4.  Pt be able to push/ pull >/= 20# with no report of pain or restrictions and be able to return to  personal workout routine with gradual progression without pain Baseline:  Goal status: INITIAL  5.  Pt to increase FOTO score to >/= 57% to demonstrate improvement in function Baseline:  Goal status: INITIAL  6.  Pt to be IND with all HEP and able to maintain and progress current LOF IND  Baseline:  Goal status: INITIAL  PLAN:  PT FREQUENCY: 1-2x/week  PT DURATION: 12 weeks  PLANNED INTERVENTIONS: 97164- PT Re-evaluation, 97110-Therapeutic exercises, 97530- Therapeutic activity, [id number]- Neuromuscular re-education, 97535- Self Care, 23536- Manual therapy, [id number]- Aquatic Therapy, 97014- Electrical stimulation (unattended), 97016- Vasopneumatic device, Taping, Dry  Needling, Joint mobilization, Scar mobilization, Cryotherapy, and Moist heat  PLAN FOR NEXT SESSION: review/ update HEP PRN. 8 weeks out at eval, shoulder mobs, PROM/ AAROM, STW in the shoulder/ bicep, continue gentile isometrics.  Jannette Spanner, PTA 12/13/22 10:57 AM Phone: [phone] Fax: [phone]

## 2022-12-16 ENCOUNTER — Ambulatory Visit: Payer: Managed Care, Other (non HMO) | Admitting: Physical Therapy

## 2022-12-16 ENCOUNTER — Encounter: Payer: Self-pay | Admitting: Physical Therapy

## 2022-12-16 DIAGNOSIS — G8929 Other chronic pain: Secondary | ICD-10-CM

## 2022-12-16 DIAGNOSIS — M25512 Pain in left shoulder: Secondary | ICD-10-CM | POA: Diagnosis not present

## 2022-12-16 DIAGNOSIS — R29898 Other symptoms and signs involving the musculoskeletal system: Secondary | ICD-10-CM

## 2022-12-16 NOTE — Therapy (Signed)
OUTPATIENT PHYSICAL THERAPY TREATMENT   Patient Name: Shirli Lamartina MRN: [id number] DOB:Apr 19, 1975, 47 y.o., female Today's Date: 12/16/2022  END OF SESSION:  PT End of Session - 12/16/22 1422     Visit Number 5    Number of Visits 24    Date for PT Re-Evaluation 02/12/23    Authorization Type [employer]    PT Start Time 1417    Activity Tolerance Patient tolerated treatment well    Behavior During Therapy [facility] for tasks assessed/performed                Past Medical History:  Diagnosis Date   GERD (gastroesophageal reflux disease)    Hiatal hernia    Palpitations 11/18/2014   Past Surgical History:  Procedure Laterality Date   ESOPHAGOGASTRODUODENOSCOPY  10/14/2005   esophagitis   L shoulder rotator cuff repair Left 09/25/2022   Patient Active Problem List   Diagnosis Date Noted   Acute intractable headache 03/15/2022   Chronic neck pain 03/15/2022   Trapezius strain 12/13/2021   Nontraumatic complete tear of right rotator cuff 08/14/2021   MDD (major depressive disorder), single episode, mild (HCC) 04/02/2019   Family history of thyroid disease 03/19/2018   Class 1 obesity without serious comorbidity with body mass index (BMI) of 31.0 to 31.9 in adult 05/07/2016   History of palpitations 11/18/2014   GERD 04/28/2007    PCP: Excell Seltzer, MD  REFERRING PROVIDER: Teryl Lucy, MD   REFERRING DIAG: s/p L shoulder SAD, RCR and bicep tenodesis  THERAPY DIAG:  Chronic left shoulder pain  Weakness of left shoulder  Rationale for Evaluation and Treatment: Rehabilitation  ONSET DATE: 09/25/2022  SUBJECTIVE:                                                                                                                                                                                      SUBJECTIVE STATEMENT: "Still having pain in the shoulder, I did ice the shoulder but the pain still comes back."   PERTINENT HISTORY: See hx  PAIN:  Are you  having pain? Yes: NPRS scale: 6/10 Pain location: L shoulder,  Pain description: throbbing,  Aggravating factors: anything a sudden pain Relieving factors: heating pad, ibuprofen and resting.   PRECAUTIONS: Shoulder  RED FLAGS: None   WEIGHT BEARING RESTRICTIONS: Yes NWB   FALLS:  Has patient fallen in last 6 months? No  LIVING ENVIRONMENT: Lives with: lives with their family Lives in: House/apartment Stairs: Yes: Internal: 17 steps; on right going up Has following equipment at home:  sling  OCCUPATION: Facilities manager  PLOF: Independent  PATIENT GOALS: lift arm and get it moving like  it should.   NEXT MD VISIT: 12/04/2022  OBJECTIVE:  Note: Objective measures were completed at Evaluation unless otherwise noted.  DIAGNOSTIC FINDINGS:  At MD's office.   PATIENT SURVEYS:  FOTO 12% and predicted 57%  COGNITION: Overall cognitive status: Within functional limits for tasks assessed     SENSATION: WFL  POSTURE: FWD  UPPER EXTREMITY ROM:   Passive ROM  Right eval Left eval Left 12/13/22 Left  12/16/2022  Shoulder flexion  40 P! AROM 65 PROM 110 AAROM supine 105  Shoulder extension      Shoulder abduction  46 P!    Shoulder adduction      Shoulder internal rotation      Shoulder external rotation  4 P!    Elbow flexion  134    Elbow extension  2    Wrist flexion      Wrist extension      Wrist ulnar deviation      Wrist radial deviation      Wrist pronation      Wrist supination      (Blank rows = not tested)  Notes: Pain and guarding noted during assessment specifically with shoulder movement and elbow extension/ flexion requiring use of distraction technique of pressing LE calf/ shin together.   UPPER EXTREMITY MMT:  MMT Right eval Left eval  Shoulder flexion    Shoulder extension    Shoulder abduction    Shoulder adduction    Shoulder internal rotation    Shoulder external rotation    Middle trapezius    Lower trapezius    Elbow  flexion    Elbow extension    Wrist flexion    Wrist extension    Wrist ulnar deviation    Wrist radial deviation    Wrist pronation    Wrist supination    Grip strength (lbs)    (Blank rows = not tested)  JOINT MOBILITY TESTING:  Gross guarding and stiffness noted mpacting mobility assessment  PALPATION:  TTP along the anteior aspect of the shoulder along the mid/ proximal bicep brachii,  anterior deltoid, middle deltoid, sub-acromial space, as well as the infrapsinatus. Trigger points noted int he L upper trap / levator scapulae.    TREATMENT:                                                                                                                                         [facility] Adult PT Treatment:                                                DATE: 12/16/2022 Therapeutic Exercise: Pulleys flexion x 2 min, scaption 1 min Seated bicep curl LUE 2 x 12 with 1# Standing pendulums with 1# in L hand -  demonstration and min verbal cues for proper form forward// side to side and cirlcles Supine AAROM shoulder flexion with hands together Supine AAROM scapular protraction 1 x 15 Manual Therapy: GHJ AP grade III, Inferior mobs Grade III Distal AC mobs AP MTPR along the pec major/ minor, biceps brachi  [facility] Adult PT Treatment:                                                DATE: 12/13/22 Therapeutic Exercise: Supine AAROM ER Supine chest press dowel Supine Pullover dowel to tolerance  Review of isometric HEP 5-10 sec holds  Pendulums  Shoulder flexion walk backs at high table  Manual Therapy: GHJ PA/ AP mobs grade III, Distraction mobs grade III PROM flexion 110 , abdct 90, ER 15  [facility] Adult PT Treatment:                                                DATE: 12/11/2022 Therapeutic Exercise: Shoulder isometrics flexion/ abduction, extension, IR/ER 5 x holding 5 seconds. Cues / demonstration for proper from Updated HEP for isometrics  Manual Therapy: GHJ PA/ AP mobs grade III,  Distraction mobs grade III PROM flexion/ abduction working into end range with oscillations to calm down pain MTPR along the infraspinatus, upper trap, middle deltoid, biceps brachii  PATIENT EDUCATION: Education details: Evaluation findings, POC, goals, HEP with proper form/ rationale.  Person educated: Patient Education method: Explanation, Verbal cues, and Handouts Education comprehension: verbalized understanding  HOME EXERCISE PROGRAM: Access Code: [email or web address] URL: [email or web address] Date: 12/11/2022 Prepared by: Lulu Riding  Exercises - Seated Upper Trapezius Stretch  - 1 x daily - 7 x weekly - 2 sets - 2 reps - 30 seconds hold - Gentle Levator Scapulae Stretch  - 1 x daily - 7 x weekly - 2 sets - 2 reps - 30 seconds hold - Shoulder Table Slides Flexion  - 2 x daily - 3 sets - 10 reps - 5 hold - Shoulder table slides ABD  - 2 x daily - 3 sets - 10 reps - 5 hold - Seated Gripping Towel  - 1 x daily - 7 x weekly - 2 sets - 10 reps - Supine Elbow Flexion Extension AROM  - 1 x daily - 7 x weekly - 2 sets - 10 reps - Standing Shoulder External Rotation AAROM with Dowel  - 1 x daily - 7 x weekly - 2 sets - 10 reps - Shoulder Flexion Overhead with Dowel  - 1 x daily - 7 x weekly - 2 sets - 10 reps - Standing Shoulder Abduction AAROM with Dowel  - 1 x daily - 7 x weekly - 2 sets - 10 reps - Standing Isometric Shoulder Internal Rotation at Doorway (Mirrored)  - 1 x daily - 7 x weekly - 2 sets - 10 reps - 10 seconds hold - Standing Isometric Shoulder Flexion with Doorway - Arm Bent  - 1 x daily - 7 x weekly - 2 sets - 10 reps - 10 seconds hold - Standing Isometric Shoulder External Rotation with Doorway and Towel Roll  - 1 x daily - 7 x weekly - 2 sets - 10 reps - 10 seconds hold - Standing Isometric Shoulder  Extension with Doorway - Arm Bent  - 1 x daily - 7 x weekly - 2 sets - 10 reps - seconds hold - Standing Isometric Shoulder Abduction with Doorway - Arm Bent  - 1 x  daily - 7 x weekly - 2 sets - 10 reps - 10 seconds hold  ASSESSMENT:  CLINICAL IMPRESSION: 12/16/2022 pt arrives to session noting pain at 6/10 but has been consistent with her HEP at home. Continued working on manual techniques to promote both GHJ and AC joint mobility, followed with PROM and AAROM of the shoulder which she is making progress with but has difficulty relaxing due to guarding/ pain. Following session pt noted pain reduced to 3/10.    Assessment at Evaluation: Patient is a 47 y.o. F who was seen today for physical therapy evaluation and treatment for L shoulder s/p RCR, Bicep Tenodesis and SAD on 09/26/22. She is 8 weeks post op today and saw her MD on 10/2 releasing her from her sling. She has limited PROM in all planes secondary to pain and guarding, MMT was withheld due to precautions. Discussed letting her arm hang down vs keeping  her elbow flexed at her side. Provided initial HEP  and pt noted soreness had reduced compared to when she arrived. She would benefit from physical therapy to reduce L shoulder pain, increase ROM and strength, improve LUE strength and overall function by addressing the deficits listed.    OBJECTIVE IMPAIRMENTS: decreased activity tolerance, decreased endurance, decreased ROM, decreased strength, increased edema, increased fascial restrictions, increased muscle spasms, impaired flexibility, impaired UE functional use, improper body mechanics, postural dysfunction, and pain.   ACTIVITY LIMITATIONS: carrying, lifting, and reach over head  PARTICIPATION LIMITATIONS: cleaning, laundry, community activity, occupation, and Exercise  PERSONAL FACTORS: Past/current experiences and Time since onset of injury/illness/exacerbation are also affecting patient's functional outcome.   REHAB POTENTIAL: Excellent  CLINICAL DECISION MAKING: Stable/uncomplicated  EVALUATION COMPLEXITY: Low   GOALS: Goals reviewed with patient? Yes  SHORT TERM GOALS: Target date:  01/01/2023    Pt to be IND with initial HEP for therapeutic progression Baseline: Goal status: INITIAL  2.  Increase L shoulder AAROM Flexion/ abduction to >/= 120 degrees with </= 5/10 pain for progression of ROM Baseline:  Goal status: INITIAL  3.  Improve FOTO score to >/= 30% to demonstrate improving function Baseline:  Goal status: INITIAL  4.  Pt to demonstrate LUE strength to >/= 3/5 to demonstrate progressing strength and function Baseline:  Goal status: INITIAL  LONG TERM GOALS: Target date: 02/12/2023   Pt have functional L shoulder AROM in all planes compared bil with no report of pain or limitatoins Baseline:  Goal status: INITIAL  2.  Pt to be a lift and lower >/= 10# to and from a overhead shelf with </= 2/10 pain or functional strength required for Adls Baseline:  Goal status: INITIAL  3.  Pt to be able to lift/ lower >/= 20# to / from waist to floor for functional lifting and strengthening Baseline:  Goal status: INITIAL  4.  Pt be able to push/ pull >/= 20# with no report of pain or restrictions and be able to return to personal workout routine with gradual progression without pain Baseline:  Goal status: INITIAL  5.  Pt to increase FOTO score to >/= 57% to demonstrate improvement in function Baseline:  Goal status: INITIAL  6.  Pt to be IND with all HEP and able to maintain and progress current LOF IND  Baseline:  Goal status: INITIAL  PLAN:  PT FREQUENCY: 1-2x/week  PT DURATION: 12 weeks  PLANNED INTERVENTIONS: 97164- PT Re-evaluation, 97110-Therapeutic exercises, 97530- Therapeutic activity, [id number]- Neuromuscular re-education, 97535- Self Care, 61607- Manual therapy, [id number]- Aquatic Therapy, 97014- Electrical stimulation (unattended), 97016- Vasopneumatic device, Taping, Dry Needling, Joint mobilization, Scar mobilization, Cryotherapy, and Moist heat  PLAN FOR NEXT SESSION: review/ update HEP PRN. 8 weeks out at eval, shoulder mobs, PROM/ AAROM,  STW in the shoulder/ bicep, continue gentile isometrics.  Maurice Fotheringham PT, DPT, LAT, ATC  12/16/22  2:23 PM

## 2022-12-18 ENCOUNTER — Encounter: Payer: Self-pay | Admitting: Physical Therapy

## 2022-12-18 ENCOUNTER — Ambulatory Visit: Payer: Managed Care, Other (non HMO) | Admitting: Physical Therapy

## 2022-12-18 DIAGNOSIS — G8929 Other chronic pain: Secondary | ICD-10-CM

## 2022-12-18 DIAGNOSIS — R29898 Other symptoms and signs involving the musculoskeletal system: Secondary | ICD-10-CM

## 2022-12-18 DIAGNOSIS — M25512 Pain in left shoulder: Secondary | ICD-10-CM | POA: Diagnosis not present

## 2022-12-18 NOTE — Therapy (Signed)
OUTPATIENT PHYSICAL THERAPY TREATMENT   Patient Name: Athel Chiera MRN: [id number] DOB:January 04, 1976, 47 y.o., female Today's Date: 12/19/2022  END OF SESSION:  PT End of Session - 12/18/22 1418     Visit Number 6    Number of Visits 24    Date for PT Re-Evaluation 02/12/23    Authorization Type [employer]    PT Start Time 1418    PT Stop Time 1510    PT Time Calculation (min) 52 min                 Past Medical History:  Diagnosis Date   GERD (gastroesophageal reflux disease)    Hiatal hernia    Palpitations 11/18/2014   Past Surgical History:  Procedure Laterality Date   ESOPHAGOGASTRODUODENOSCOPY  10/14/2005   esophagitis   L shoulder rotator cuff repair Left 09/25/2022   Patient Active Problem List   Diagnosis Date Noted   Acute intractable headache 03/15/2022   Chronic neck pain 03/15/2022   Trapezius strain 12/13/2021   Nontraumatic complete tear of right rotator cuff 08/14/2021   MDD (major depressive disorder), single episode, mild (HCC) 04/02/2019   Family history of thyroid disease 03/19/2018   Class 1 obesity without serious comorbidity with body mass index (BMI) of 31.0 to 31.9 in adult 05/07/2016   History of palpitations 11/18/2014   GERD 04/28/2007    PCP: Excell Seltzer, MD  REFERRING PROVIDER: Teryl Lucy, MD   REFERRING DIAG: s/p L shoulder SAD, RCR and bicep tenodesis  THERAPY DIAG:  Chronic left shoulder pain  Weakness of left shoulder  Rationale for Evaluation and Treatment: Rehabilitation  ONSET DATE: 09/25/2022  SUBJECTIVE:                                                                                                                                                                                      SUBJECTIVE STATEMENT: "Still having the soreness at about 6/10, however I have come terms with the shoulder and the process / time frame for the healing."   PERTINENT HISTORY: See hx  PAIN:  Are you having pain? Yes:  NPRS scale: 6/10 Pain location: L shoulder,  Pain description: throbbing,  Aggravating factors: anything a sudden pain Relieving factors: heating pad, ibuprofen and resting.   PRECAUTIONS: Shoulder  RED FLAGS: None   WEIGHT BEARING RESTRICTIONS: Yes NWB   FALLS:  Has patient fallen in last 6 months? No  LIVING ENVIRONMENT: Lives with: lives with their family Lives in: House/apartment Stairs: Yes: Internal: 17 steps; on right going up Has following equipment at home:  sling  OCCUPATION: Facilities manager  PLOF: Independent  PATIENT GOALS: lift arm  and get it moving like it should.   NEXT MD VISIT: 12/04/2022  OBJECTIVE:  Note: Objective measures were completed at Evaluation unless otherwise noted.  DIAGNOSTIC FINDINGS:  At MD's office.   PATIENT SURVEYS:  FOTO 12% and predicted 57% 12/18/2022  COGNITION: Overall cognitive status: Within functional limits for tasks assessed     SENSATION: WFL  POSTURE: FWD  UPPER EXTREMITY ROM:   Passive ROM  Right eval Left eval Left 12/13/22 Left  12/16/2022 Left 12/18/22  Shoulder flexion  40 P! AROM 65 PROM 110 AAROM supine 105 AROM 102  Shoulder extension       Shoulder abduction  46 P!     Shoulder adduction       Shoulder internal rotation       Shoulder external rotation  4 P!     Elbow flexion  134     Elbow extension  2     Wrist flexion       Wrist extension       Wrist ulnar deviation       Wrist radial deviation       Wrist pronation       Wrist supination       (Blank rows = not tested)  Notes: Pain and guarding noted during assessment specifically with shoulder movement and elbow extension/ flexion requiring use of distraction technique of pressing LE calf/ shin together.   UPPER EXTREMITY MMT:  MMT Right eval Left eval  Shoulder flexion    Shoulder extension    Shoulder abduction    Shoulder adduction    Shoulder internal rotation    Shoulder external rotation    Middle trapezius     Lower trapezius    Elbow flexion    Elbow extension    Wrist flexion    Wrist extension    Wrist ulnar deviation    Wrist radial deviation    Wrist pronation    Wrist supination    Grip strength (lbs)    (Blank rows = not tested)  JOINT MOBILITY TESTING:  Gross guarding and stiffness noted mpacting mobility assessment  PALPATION:  TTP along the anteior aspect of the shoulder along the mid/ proximal bicep brachii,  anterior deltoid, middle deltoid, sub-acromial space, as well as the infrapsinatus. Trigger points noted int he L upper trap / levator scapulae.    TREATMENT:                                                                                                                                         [facility] Adult PT Treatment:                                                DATE: 12/18/22 Therapeutic Exercise: Supine AAROM  Chest press with plus x 20 with distraction technique Supine over head flexion AAROM short Lever x 20 with  L upper trap stretch 2 x 30 sec  Seated L shoulder low load long duration ER stretch with elbow resting on table utilizing trunk lean, table elevated to 90 degrees of abduction UBE L1 x 4 min (FWD/ BWD 2 min ea) cues for gentle smooth movement Wall ladder flexion x 3 and abduction x 3  Modalities: Ice pack x 10 min L shoulder in sitting    [facility] Adult PT Treatment:                                                DATE: 12/16/2022 Therapeutic Exercise: Pulleys flexion x 2 min, scaption 1 min Seated bicep curl LUE 2 x 12 with 1# Standing pendulums with 1# in L hand - demonstration and min verbal cues for proper form forward// side to side and cirlcles Supine AAROM shoulder flexion with hands together Supine AAROM scapular protraction 1 x 15 Manual Therapy: GHJ AP grade III, Inferior mobs Grade III Distal AC mobs AP MTPR along the pec major/ minor, biceps brachi  [facility] Adult PT Treatment:                                                DATE:  12/13/22 Therapeutic Exercise: Supine AAROM ER Supine chest press dowel Supine Pullover dowel to tolerance  Review of isometric HEP 5-10 sec holds  Pendulums  Shoulder flexion walk backs at high table  Manual Therapy: GHJ PA/ AP mobs grade III, Distraction mobs grade III PROM flexion 110 , abdct 90, ER 15  PATIENT EDUCATION: Education details: Evaluation findings, POC, goals, HEP with proper form/ rationale.  Person educated: Patient Education method: Explanation, Verbal cues, and Handouts Education comprehension: verbalized understanding  HOME EXERCISE PROGRAM: Access Code: [email or web address] URL: [email or web address] Date: 12/18/2022 Prepared by: Lulu Riding  Exercises - Seated Upper Trapezius Stretch  - 1 x daily - 7 x weekly - 2 sets - 2 reps - 30 seconds hold - Gentle Levator Scapulae Stretch  - 1 x daily - 7 x weekly - 2 sets - 2 reps - 30 seconds hold - Shoulder Table Slides Flexion  - 2 x daily - 3 sets - 10 reps - 5 hold - Shoulder table slides ABD  - 2 x daily - 3 sets - 10 reps - 5 hold - Seated Gripping Towel  - 1 x daily - 7 x weekly - 2 sets - 10 reps - Supine Elbow Flexion Extension AROM  - 1 x daily - 7 x weekly - 2 sets - 10 reps - Standing Shoulder External Rotation AAROM with Dowel  - 1 x daily - 7 x weekly - 2 sets - 10 reps - Shoulder Flexion Overhead with Dowel  - 1 x daily - 7 x weekly - 2 sets - 10 reps - Standing Shoulder Abduction AAROM with Dowel  - 1 x daily - 7 x weekly - 2 sets - 10 reps - Standing Isometric Shoulder Internal Rotation at Doorway (Mirrored)  - 1 x daily - 7 x weekly - 2 sets - 10 reps - 10 seconds hold - Standing Isometric Shoulder Flexion  with Doorway - Arm Bent  - 1 x daily - 7 x weekly - 2 sets - 10 reps - 10 seconds hold - Standing Isometric Shoulder External Rotation with Doorway and Towel Roll  - 1 x daily - 7 x weekly - 2 sets - 10 reps - 10 seconds hold - Standing Isometric Shoulder Extension with Doorway - Arm Bent   - 1 x daily - 7 x weekly - 2 sets - 10 reps - seconds hold - Standing Isometric Shoulder Abduction with Doorway - Arm Bent  - 1 x daily - 7 x weekly - 2 sets - 10 reps - 10 seconds hold - Seated Shoulder External Rotation PROM on Table  - 3 x daily - 7 x weekly - 2 sets - 10 reps - 30-60 sec hold  ASSESSMENT:  CLINICAL IMPRESSION: 12/19/2022 Mrs Fileccia is 12 weeks post-op today. Continued working on shoulder AAROM and gentle activation which she did very well with. She does continue to require cues that soreness is okay and doesn't equate to harm. Focus on patient AAROM exercises today with cues to work into the tightness/ end ranges. Discussed that it is okay to use the LUE for light tasks like flushing the toilet, turning the faucet, etc and continue working on ROM at home to get used to utilizing the LUE. End of session she reported pain dropped from a 6/10 to a 4/10. Utilized ice pack to calm down potentiall soreness as result of todays session.    Assessment at Evaluation: Patient is a 47 y.o. F who was seen today for physical therapy evaluation and treatment for L shoulder s/p RCR, Bicep Tenodesis and SAD on 09/26/22. She is 8 weeks post op today and saw her MD on 10/2 releasing her from her sling. She has limited PROM in all planes secondary to pain and guarding, MMT was withheld due to precautions. Discussed letting her arm hang down vs keeping  her elbow flexed at her side. Provided initial HEP  and pt noted soreness had reduced compared to when she arrived. She would benefit from physical therapy to reduce L shoulder pain, increase ROM and strength, improve LUE strength and overall function by addressing the deficits listed.    OBJECTIVE IMPAIRMENTS: decreased activity tolerance, decreased endurance, decreased ROM, decreased strength, increased edema, increased fascial restrictions, increased muscle spasms, impaired flexibility, impaired UE functional use, improper body mechanics, postural  dysfunction, and pain.   ACTIVITY LIMITATIONS: carrying, lifting, and reach over head  PARTICIPATION LIMITATIONS: cleaning, laundry, community activity, occupation, and Exercise  PERSONAL FACTORS: Past/current experiences and Time since onset of injury/illness/exacerbation are also affecting patient's functional outcome.   REHAB POTENTIAL: Excellent  CLINICAL DECISION MAKING: Stable/uncomplicated  EVALUATION COMPLEXITY: Low   GOALS: Goals reviewed with patient? Yes  SHORT TERM GOALS: Target date: 01/01/2023    Pt to be IND with initial HEP for therapeutic progression Baseline: Goal status: INITIAL  2.  Increase L shoulder AAROM Flexion/ abduction to >/= 120 degrees with </= 5/10 pain for progression of ROM Baseline:  Goal status: INITIAL  3.  Improve FOTO score to >/= 30% to demonstrate improving function Baseline:  Goal status: INITIAL  4.  Pt to demonstrate LUE strength to >/= 3/5 to demonstrate progressing strength and function Baseline:  Goal status: INITIAL  LONG TERM GOALS: Target date: 02/12/2023   Pt have functional L shoulder AROM in all planes compared bil with no report of pain or limitatoins Baseline:  Goal status: INITIAL  2.  Pt to be a lift and lower >/= 10# to and from a overhead shelf with </= 2/10 pain or functional strength required for Adls Baseline:  Goal status: INITIAL  3.  Pt to be able to lift/ lower >/= 20# to / from waist to floor for functional lifting and strengthening Baseline:  Goal status: INITIAL  4.  Pt be able to push/ pull >/= 20# with no report of pain or restrictions and be able to return to personal workout routine with gradual progression without pain Baseline:  Goal status: INITIAL  5.  Pt to increase FOTO score to >/= 57% to demonstrate improvement in function Baseline:  Goal status: INITIAL  6.  Pt to be IND with all HEP and able to maintain and progress current LOF IND  Baseline:  Goal status:  INITIAL  PLAN:  PT FREQUENCY: 1-2x/week  PT DURATION: 12 weeks  PLANNED INTERVENTIONS: 97164- PT Re-evaluation, 97110-Therapeutic exercises, 97530- Therapeutic activity, [id number]- Neuromuscular re-education, 97535- Self Care, 16109- Manual therapy, [id number]- Aquatic Therapy, 97014- Electrical stimulation (unattended), 97016- Vasopneumatic device, Taping, Dry Needling, Joint mobilization, Scar mobilization, Cryotherapy, and Moist heat  PLAN FOR NEXT SESSION: review/ update HEP PRN. 8 weeks out at eval, shoulder mobs, PROM/ AAROM, STW in the shoulder/ bicep, continue gentile isometrics.  Fannie Alomar PT, DPT, LAT, ATC  12/19/22  10:42 AM

## 2022-12-23 ENCOUNTER — Encounter: Payer: Self-pay | Admitting: Physical Therapy

## 2022-12-23 ENCOUNTER — Ambulatory Visit: Payer: Managed Care, Other (non HMO) | Admitting: Physical Therapy

## 2022-12-23 DIAGNOSIS — G8929 Other chronic pain: Secondary | ICD-10-CM

## 2022-12-23 DIAGNOSIS — M25512 Pain in left shoulder: Secondary | ICD-10-CM | POA: Diagnosis not present

## 2022-12-23 NOTE — Therapy (Signed)
OUTPATIENT PHYSICAL THERAPY TREATMENT   Patient Name: Terry Camacho MRN: [id number] DOB:October 06, 1975, 47 y.o., female Today's Date: 12/23/2022  END OF SESSION:  PT End of Session - 12/23/22 1108     Visit Number 7    Number of Visits 24    Date for PT Re-Evaluation 02/12/23    Authorization Type [employer]    PT Start Time 1108   pt arrived late   PT Stop Time 1148    PT Time Calculation (min) 40 min    Activity Tolerance Patient tolerated treatment well                 Past Medical History:  Diagnosis Date   GERD (gastroesophageal reflux disease)    Hiatal hernia    Palpitations 11/18/2014   Past Surgical History:  Procedure Laterality Date   ESOPHAGOGASTRODUODENOSCOPY  10/14/2005   esophagitis   L shoulder rotator cuff repair Left 09/25/2022   Patient Active Problem List   Diagnosis Date Noted   Acute intractable headache 03/15/2022   Chronic neck pain 03/15/2022   Trapezius strain 12/13/2021   Nontraumatic complete tear of right rotator cuff 08/14/2021   MDD (major depressive disorder), single episode, mild (HCC) 04/02/2019   Family history of thyroid disease 03/19/2018   Class 1 obesity without serious comorbidity with body mass index (BMI) of 31.0 to 31.9 in adult 05/07/2016   History of palpitations 11/18/2014   GERD 04/28/2007    PCP: Excell Seltzer, MD  REFERRING PROVIDER: Teryl Lucy, MD   REFERRING DIAG: s/p L shoulder SAD, RCR and bicep tenodesis  THERAPY DIAG:  Chronic left shoulder pain  Rationale for Evaluation and Treatment: Rehabilitation  ONSET DATE: 09/25/2022  SUBJECTIVE:                                                                                                                                                                                      SUBJECTIVE STATEMENT: "I was feeling pretty good after the last session, for a day or 2 then Sunday I had a really bad day, not sure why. Today I am still about the  same."   PERTINENT HISTORY: See hx  PAIN:  Are you having pain? Yes: NPRS scale: 6/10 Pain location: L shoulder,  Pain description: throbbing,  Aggravating factors: anything a sudden pain Relieving factors: heating pad, ibuprofen and resting.   PRECAUTIONS: Shoulder  RED FLAGS: None   WEIGHT BEARING RESTRICTIONS: Yes NWB   FALLS:  Has patient fallen in last 6 months? No  LIVING ENVIRONMENT: Lives with: lives with their family Lives in: House/apartment Stairs: Yes: Internal: 17 steps; on right going up Has following equipment at  home:  sling  OCCUPATION: Facilities manager  PLOF: Independent  PATIENT GOALS: lift arm and get it moving like it should.   NEXT MD VISIT: 12/04/2022  OBJECTIVE:  Note: Objective measures were completed at Evaluation unless otherwise noted.  DIAGNOSTIC FINDINGS:  At MD's office.   PATIENT SURVEYS:  FOTO 12% and predicted 57% 12/18/2022  COGNITION: Overall cognitive status: Within functional limits for tasks assessed     SENSATION: WFL  POSTURE: FWD  UPPER EXTREMITY ROM:   Passive ROM  Right eval Left eval Left 12/13/22 Left  12/16/2022 Left 12/18/22  Shoulder flexion  40 P! AROM 65 PROM 110 AAROM supine 105 AROM 102  Shoulder extension       Shoulder abduction  46 P!     Shoulder adduction       Shoulder internal rotation       Shoulder external rotation  4 P!     Elbow flexion  134     Elbow extension  2     Wrist flexion       Wrist extension       Wrist ulnar deviation       Wrist radial deviation       Wrist pronation       Wrist supination       (Blank rows = not tested)  Notes: Pain and guarding noted during assessment specifically with shoulder movement and elbow extension/ flexion requiring use of distraction technique of pressing LE calf/ shin together.   UPPER EXTREMITY MMT:  MMT Right eval Left eval  Shoulder flexion    Shoulder extension    Shoulder abduction    Shoulder adduction     Shoulder internal rotation    Shoulder external rotation    Middle trapezius    Lower trapezius    Elbow flexion    Elbow extension    Wrist flexion    Wrist extension    Wrist ulnar deviation    Wrist radial deviation    Wrist pronation    Wrist supination    Grip strength (lbs)    (Blank rows = not tested)  JOINT MOBILITY TESTING:  Gross guarding and stiffness noted mpacting mobility assessment  PALPATION:  TTP along the anteior aspect of the shoulder along the mid/ proximal bicep brachii,  anterior deltoid, middle deltoid, sub-acromial space, as well as the infrapsinatus. Trigger points noted int he L upper trap / levator scapulae.    TREATMENT:                                                                                                                                         [facility] Adult PT Treatment:  DATE: 12/23/2022 Therapeutic Exercise: Pulley flexion 2:30 sec, Scaption 2:30 Rolling black physioball up the wall using both arms 2 x 5  Dowel rod IR 2 x 10  Standing isometric shoulder press 1 x 10 holding 5 seconds Rows 2 x 10 with YTB Shoulder Rows/ IR/ER YTB 2 x 10 with towel under arm   Updated HEP for gentle dowel IR Manual Therapy: MTPR along the infraspinatus x 3  [facility] Adult PT Treatment:                                                DATE: 12/18/22 Therapeutic Exercise: Supine AAROM Chest press with plus x 20 with distraction technique Supine over head flexion AAROM short Lever x 20 with  L upper trap stretch 2 x 30 sec  Seated L shoulder low load long duration ER stretch with elbow resting on table utilizing trunk lean, table elevated to 90 degrees of abduction UBE L1 x 4 min (FWD/ BWD 2 min ea) cues for gentle smooth movement Wall ladder flexion x 3 and abduction x 3  Modalities: Ice pack x 10 min L shoulder in sitting   [facility] Adult PT Treatment:                                                DATE:  12/16/2022 Therapeutic Exercise: Pulleys flexion x 2 min, scaption 1 min Seated bicep curl LUE 2 x 12 with 1# Standing pendulums with 1# in L hand - demonstration and min verbal cues for proper form forward// side to side and cirlcles Supine AAROM shoulder flexion with hands together Supine AAROM scapular protraction 1 x 15 Manual Therapy: GHJ AP grade III, Inferior mobs Grade III Distal AC mobs AP MTPR along the pec major/ minor, biceps brachi   PATIENT EDUCATION: Education details: Evaluation findings, POC, goals, HEP with proper form/ rationale.  Person educated: Patient Education method: Explanation, Verbal cues, and Handouts Education comprehension: verbalized understanding  HOME EXERCISE PROGRAM: Access Code: [email or web address] URL: [email or web address] Date: 12/23/2022 Prepared by: Lulu Riding  Exercises - Seated Upper Trapezius Stretch  - 1 x daily - 7 x weekly - 2 sets - 2 reps - 30 seconds hold - Gentle Levator Scapulae Stretch  - 1 x daily - 7 x weekly - 2 sets - 2 reps - 30 seconds hold - Shoulder Table Slides Flexion  - 2 x daily - 3 sets - 10 reps - 5 hold - Shoulder table slides ABD  - 2 x daily - 3 sets - 10 reps - 5 hold - Seated Gripping Towel  - 1 x daily - 7 x weekly - 2 sets - 10 reps - Supine Elbow Flexion Extension AROM  - 1 x daily - 7 x weekly - 2 sets - 10 reps - Standing Shoulder External Rotation AAROM with Dowel  - 1 x daily - 7 x weekly - 2 sets - 10 reps - Shoulder Flexion Overhead with Dowel  - 1 x daily - 7 x weekly - 2 sets - 10 reps - Standing Shoulder Abduction AAROM with Dowel  - 1 x daily - 7 x weekly - 2 sets - 10 reps - Standing Isometric Shoulder Internal Rotation at Doorway (Mirrored)  -  1 x daily - 7 x weekly - 2 sets - 10 reps - 10 seconds hold - Standing Isometric Shoulder Flexion with Doorway - Arm Bent  - 1 x daily - 7 x weekly - 2 sets - 10 reps - 10 seconds hold - Standing Isometric Shoulder External Rotation with  Doorway and Towel Roll  - 1 x daily - 7 x weekly - 2 sets - 10 reps - 10 seconds hold - Standing Isometric Shoulder Extension with Doorway - Arm Bent  - 1 x daily - 7 x weekly - 2 sets - 10 reps - seconds hold - Standing Isometric Shoulder Abduction with Doorway - Arm Bent  - 1 x daily - 7 x weekly - 2 sets - 10 reps - 10 seconds hold - Seated Shoulder External Rotation PROM on Table  - 3 x daily - 7 x weekly - 2 sets - 10 reps - 30-60 sec hold - Standing Bilateral Shoulder Internal Rotation AAROM with Dowel  - 1 x daily - 7 x weekly - 2 sets - 10 reps - 5 sec hold  ASSESSMENT:  CLINICAL IMPRESSION: 12/23/2022 Terry Camacho continues to report no changes in pain, but does noting some increase in pain yesterday with no specific cause or onset that has since calmed down. Conitnued working on [employer], and gentle strengthening via isotonics with light theraband resistance. Pt noted reduction in pain end of session.   Assessment at Evaluation: Patient is a 47 y.o. F who was seen today for physical therapy evaluation and treatment for L shoulder s/p RCR, Bicep Tenodesis and SAD on 09/26/22. She is 8 weeks post op today and saw her MD on 10/2 releasing her from her sling. She has limited PROM in all planes secondary to pain and guarding, MMT was withheld due to precautions. Discussed letting her arm hang down vs keeping  her elbow flexed at her side. Provided initial HEP  and pt noted soreness had reduced compared to when she arrived. She would benefit from physical therapy to reduce L shoulder pain, increase ROM and strength, improve LUE strength and overall function by addressing the deficits listed.    OBJECTIVE IMPAIRMENTS: decreased activity tolerance, decreased endurance, decreased ROM, decreased strength, increased edema, increased fascial restrictions, increased muscle spasms, impaired flexibility, impaired UE functional use, improper body mechanics, postural dysfunction, and pain.   ACTIVITY LIMITATIONS:  carrying, lifting, and reach over head  PARTICIPATION LIMITATIONS: cleaning, laundry, community activity, occupation, and Exercise  PERSONAL FACTORS: Past/current experiences and Time since onset of injury/illness/exacerbation are also affecting patient's functional outcome.   REHAB POTENTIAL: Excellent  CLINICAL DECISION MAKING: Stable/uncomplicated  EVALUATION COMPLEXITY: Low   GOALS: Goals reviewed with patient? Yes  SHORT TERM GOALS: Target date: 01/01/2023    Pt to be IND with initial HEP for therapeutic progression Baseline: Goal status: INITIAL  2.  Increase L shoulder AAROM Flexion/ abduction to >/= 120 degrees with </= 5/10 pain for progression of ROM Baseline:  Goal status: INITIAL  3.  Improve FOTO score to >/= 30% to demonstrate improving function Baseline:  Goal status: INITIAL  4.  Pt to demonstrate LUE strength to >/= 3/5 to demonstrate progressing strength and function Baseline:  Goal status: INITIAL  LONG TERM GOALS: Target date: 02/12/2023   Pt have functional L shoulder AROM in all planes compared bil with no report of pain or limitatoins Baseline:  Goal status: INITIAL  2.  Pt to be a lift and lower >/= 10# to and from a  overhead shelf with </= 2/10 pain or functional strength required for Adls Baseline:  Goal status: INITIAL  3.  Pt to be able to lift/ lower >/= 20# to / from waist to floor for functional lifting and strengthening Baseline:  Goal status: INITIAL  4.  Pt be able to push/ pull >/= 20# with no report of pain or restrictions and be able to return to personal workout routine with gradual progression without pain Baseline:  Goal status: INITIAL  5.  Pt to increase FOTO score to >/= 57% to demonstrate improvement in function Baseline:  Goal status: INITIAL  6.  Pt to be IND with all HEP and able to maintain and progress current LOF IND  Baseline:  Goal status: INITIAL  PLAN:  PT FREQUENCY: 1-2x/week  PT DURATION: 12  weeks  PLANNED INTERVENTIONS: 97164- PT Re-evaluation, 97110-Therapeutic exercises, 97530- Therapeutic activity, [id number]- Neuromuscular re-education, 97535- Self Care, 16109- Manual therapy, [id number]- Aquatic Therapy, 97014- Electrical stimulation (unattended), 97016- Vasopneumatic device, Taping, Dry Needling, Joint mobilization, Scar mobilization, Cryotherapy, and Moist heat  PLAN FOR NEXT SESSION: review/ update HEP PRN. 8 weeks out at eval, shoulder mobs, PROM/ AAROM, STW in the shoulder/ bicep, continue gentile isometrics.  Terry Camacho PT, DPT, LAT, ATC  12/23/22  12:21 PM

## 2022-12-26 ENCOUNTER — Ambulatory Visit: Payer: Managed Care, Other (non HMO) | Admitting: Family Medicine

## 2022-12-26 VITALS — BP 90/70 | HR 98 | Temp 98.0°F | Ht 62.0 in | Wt 181.2 lb

## 2022-12-26 DIAGNOSIS — G8929 Other chronic pain: Secondary | ICD-10-CM | POA: Insufficient documentation

## 2022-12-26 DIAGNOSIS — F320 Major depressive disorder, single episode, mild: Secondary | ICD-10-CM

## 2022-12-26 DIAGNOSIS — M25512 Pain in left shoulder: Secondary | ICD-10-CM

## 2022-12-26 DIAGNOSIS — F331 Major depressive disorder, recurrent, moderate: Secondary | ICD-10-CM

## 2022-12-26 MED ORDER — SERTRALINE HCL 50 MG PO TABS: 50.0000 mg | ORAL_TABLET | Freq: Every day | ORAL | 3 refills | Status: DC

## 2022-12-26 MED ORDER — DICLOFENAC SODIUM 75 MG PO TBEC: 75.0000 mg | DELAYED_RELEASE_TABLET | Freq: Two times a day (BID) | ORAL | 0 refills | Status: DC

## 2022-12-26 NOTE — Assessment & Plan Note (Addendum)
Acute episode, specifically related to recent shoulder surgery and inability to work and exercise as usual.  Pain not well-controlled.  She feels like she is not recovering as quickly from surgery as she would like.  Will restart sertraline 50 mg p.o. daily, placed referral to counselor to work through specific stressors.  Follow-up in 4 weeks with MyChart message update if doing well or video/in person visit for follow-up if mood not improving as much as expected.

## 2022-12-26 NOTE — Progress Notes (Signed)
Patient ID: Terry Camacho, female    DOB: Jan 15, 1976, 47 y.o.   MRN: [id number]  This visit was conducted in person.  BP 90/70 (BP Location: Left Arm, Patient Position: Sitting, Cuff Size: Large)   Pulse 98   Temp 98 F (36.7 C) (Temporal)   Ht 5\' 2"  (1.575 m)   Wt 181 lb 4 oz (82.2 kg)   SpO2 99%   BMI 33.15 kg/m    CC:  Chief Complaint  Patient presents with   Depression    Subjective:   HPI: Terry Camacho is a 47 y.o. female presenting on 12/26/2022 for Depression  MDD, inadequate control   She has been under a lot of stress with recent rotator cuff surgery 13 weeks ago ... Pain has been difficult, taken pout of work.  Issues with disability.  Now increased depression in last 2 months, gradually worsening.    Not able to work out.. decreased motivation.  Trouble sleeping.   Shoulder pain continued .Marland Kitchen Still pain in shoulder keeping up at night.      12/26/2022   11:09 AM 03/15/2022    8:51 AM 04/30/2019    3:25 PM  Depression screen PHQ 2/9  Decreased Interest 2 0 3  Down, Depressed, Hopeless 2 0 2  PHQ - 2 Score 4 0 5  Altered sleeping 2  0  Tired, decreased energy 2  3  Change in appetite 2  0  Feeling bad or failure about yourself  2  2  Trouble concentrating 1  2  Moving slowly or fidgety/restless 0  0  Suicidal thoughts 0  0  PHQ-9 Score 13  12  Difficult doing work/chores Somewhat difficult  Somewhat difficult      12/26/2022   11:10 AM 04/30/2019    3:25 PM  GAD 7 : Generalized Anxiety Score  Nervous, Anxious, on Edge 3 2  Control/stop worrying 3 2  Worry too much - different things 3 1  Trouble relaxing 1 1  Restless 0 0  Easily annoyed or irritable 3 2  Afraid - awful might happen 0 1  Total GAD 7 Score 13 9  Anxiety Difficulty Somewhat difficult Somewhat difficult          Relevant past medical, surgical, family and social history reviewed and updated as indicated. Interim medical history since our last visit  reviewed. Allergies and medications reviewed and updated. Outpatient Medications Prior to Visit  Medication Sig Dispense Refill   fluticasone (FLONASE) 50 MCG/ACT nasal spray Place 2 sprays into both nostrils daily. 3 g 3   levonorgestrel (MIRENA) 20 MCG/24HR IUD 1 each by Intrauterine route once.     nortriptyline (PAMELOR) 10 MG capsule Take 1 capsule (10 mg total) by mouth at bedtime. (Patient not taking: Reported on 11/20/2022) 30 capsule 5   No facility-administered medications prior to visit.     Per HPI unless specifically indicated in ROS section below Review of Systems  Constitutional:  Negative for fatigue and fever.  HENT:  Negative for congestion.   Eyes:  Negative for pain.  Respiratory:  Negative for cough and shortness of breath.   Cardiovascular:  Negative for chest pain, palpitations and leg swelling.  Gastrointestinal:  Negative for abdominal pain.  Genitourinary:  Negative for dysuria and vaginal bleeding.  Musculoskeletal:  Negative for back pain.  Neurological:  Negative for syncope, light-headedness and headaches.  Psychiatric/Behavioral:  Positive for dysphoric mood and sleep disturbance. Negative for agitation and self-injury. The patient is not  nervous/anxious.    Objective:  BP 90/70 (BP Location: Left Arm, Patient Position: Sitting, Cuff Size: Large)   Pulse 98   Temp 98 F (36.7 C) (Temporal)   Ht 5\' 2"  (1.575 m)   Wt 181 lb 4 oz (82.2 kg)   SpO2 99%   BMI 33.15 kg/m   Wt Readings from Last 3 Encounters:  12/26/22 181 lb 4 oz (82.2 kg)  03/29/22 178 lb (80.7 kg)  03/21/22 178 lb (80.7 kg)      Physical Exam Constitutional:      General: She is not in acute distress.    Appearance: Normal appearance. She is well-developed. She is not ill-appearing or toxic-appearing.  HENT:     Head: Normocephalic.     Right Ear: Hearing, tympanic membrane, ear canal and external ear normal. Tympanic membrane is not erythematous, retracted or bulging.     Left  Ear: Hearing, tympanic membrane, ear canal and external ear normal. Tympanic membrane is not erythematous, retracted or bulging.     Nose: No mucosal edema or rhinorrhea.     Right Sinus: No maxillary sinus tenderness or frontal sinus tenderness.     Left Sinus: No maxillary sinus tenderness or frontal sinus tenderness.     Mouth/Throat:     Mouth: Oropharynx is clear and moist and mucous membranes are normal.     Pharynx: Uvula midline.  Eyes:     General: Lids are normal. Lids are everted, no foreign bodies appreciated.     Extraocular Movements: EOM normal.     Conjunctiva/sclera: Conjunctivae normal.     Pupils: Pupils are equal, round, and reactive to light.  Neck:     Thyroid: No thyroid mass or thyromegaly.     Vascular: No carotid bruit.     Trachea: Trachea normal.  Cardiovascular:     Rate and Rhythm: Normal rate and regular rhythm.     Pulses: Normal pulses.     Heart sounds: Normal heart sounds, S1 normal and S2 normal. No murmur heard.    No friction rub. No gallop.  Pulmonary:     Effort: Pulmonary effort is normal. No tachypnea or respiratory distress.     Breath sounds: Normal breath sounds. No decreased breath sounds, wheezing, rhonchi or rales.  Abdominal:     General: Bowel sounds are normal.     Palpations: Abdomen is soft.     Tenderness: There is no abdominal tenderness.  Musculoskeletal:     Left shoulder: Tenderness present. No swelling, deformity or bony tenderness. Decreased range of motion. Normal strength. Normal pulse.     Cervical back: Normal range of motion and neck supple.  Skin:    General: Skin is warm, dry and intact.     Findings: No rash.  Neurological:     Mental Status: She is alert.  Psychiatric:        Mood and Affect: Mood is not anxious or depressed.        Speech: Speech normal.        Behavior: Behavior normal. Behavior is cooperative.        Thought Content: Thought content normal.        Cognition and Memory: Cognition and  memory normal.        Judgment: Judgment normal.       Results for orders placed or performed during the hospital encounter of 03/21/22  CBC with Differential  Result Value Ref Range   WBC 6.2 4.0 - 10.5 K/uL  RBC 4.88 3.87 - 5.11 MIL/uL   Hemoglobin 12.4 12.0 - 15.0 g/dL   HCT 04.5 40.9 - 81.1 %   MCV 80.7 80.0 - 100.0 fL   MCH 25.4 (L) 26.0 - 34.0 pg   MCHC 31.5 30.0 - 36.0 g/dL   RDW 91.4 78.2 - 95.6 %   Platelets 224 150 - 400 K/uL   nRBC 0.0 0.0 - 0.2 %   Neutrophils Relative % 42 %   Neutro Abs 2.6 1.7 - 7.7 K/uL   Lymphocytes Relative 45 %   Lymphs Abs 2.8 0.7 - 4.0 K/uL   Monocytes Relative 10 %   Monocytes Absolute 0.6 0.1 - 1.0 K/uL   Eosinophils Relative 2 %   Eosinophils Absolute 0.1 0.0 - 0.5 K/uL   Basophils Relative 1 %   Basophils Absolute 0.1 0.0 - 0.1 K/uL   Immature Granulocytes 0 %   Abs Immature Granulocytes 0.02 0.00 - 0.07 K/uL  Comprehensive metabolic panel  Result Value Ref Range   Sodium 133 (L) 135 - 145 mmol/L   Potassium 4.1 3.5 - 5.1 mmol/L   Chloride 102 98 - 111 mmol/L   CO2 23 22 - 32 mmol/L   Glucose, Bld 103 (H) 70 - 99 mg/dL   BUN 15 6 - 20 mg/dL   Creatinine, Ser 2.13 0.44 - 1.00 mg/dL   Calcium 8.7 (L) 8.9 - 10.3 mg/dL   Total Protein 7.0 6.5 - 8.1 g/dL   Albumin 4.0 3.5 - 5.0 g/dL   AST 22 15 - 41 U/L   ALT 17 0 - 44 U/L   Alkaline Phosphatase 54 38 - 126 U/L   Total Bilirubin 0.6 0.3 - 1.2 mg/dL   GFR, Estimated >08 >65 mL/min   Anion gap 8 5 - 15  Lipase, blood  Result Value Ref Range   Lipase 37 11 - 51 U/L  Urinalysis, Routine w reflex microscopic -Urine, Clean Catch  Result Value Ref Range   Color, Urine YELLOW (A) YELLOW   APPearance CLEAR (A) CLEAR   Specific Gravity, Urine 1.016 1.005 - 1.030   pH 5.0 5.0 - 8.0   Glucose, UA NEGATIVE NEGATIVE mg/dL   Hgb urine dipstick NEGATIVE NEGATIVE   Bilirubin Urine NEGATIVE NEGATIVE   Ketones, ur NEGATIVE NEGATIVE mg/dL   Protein, ur NEGATIVE NEGATIVE mg/dL   Nitrite  NEGATIVE NEGATIVE   Leukocytes,Ua NEGATIVE NEGATIVE    Assessment and Plan  MDD (major depressive disorder), single episode, mild (HCC) Assessment & Plan: Acute episode, specifically related to recent shoulder surgery and inability to work and exercise as usual.  Pain not well-controlled.  She feels like she is not recovering as quickly from surgery as she would like.  Will restart sertraline 50 mg p.o. daily, placed referral to counselor to work through specific stressors.  Follow-up in 4 weeks with MyChart message update if doing well or video/in person visit for follow-up if mood not improving as much as expected.  Orders: -     Ambulatory referral to Psychology  Chronic left shoulder pain Assessment & Plan: Chronic, status post left rotator cuff tear repair, continuing to have issues 3 months out from surgery.  She has follow-up in 6 days with her orthopedic doctor. She states she continues to have trouble sleeping at night from shoulder pain.  She continues to have limited range of motion in her shoulder.  She has had minimal improvement with no or ibuprofen and narcotics gave her side effects. Encouraged her to try  diclofenac 75 mg p.o. twice daily  (with food)for pain and inflammation, she can review this with her orthopedic doctor prior to starting if she would prefer.   MDD (major depressive disorder), recurrent episode, moderate (HCC) Assessment & Plan: Acute episode, specifically related to recent shoulder surgery and inability to work and exercise as usual.  Pain not well-controlled.  She feels like she is not recovering as quickly from surgery as she would like.  Will restart sertraline 50 mg p.o. daily, placed referral to counselor to work through specific stressors.  Follow-up in 4 weeks with MyChart message update if doing well or video/in person visit for follow-up if mood not improving as much as expected.   Other orders -     Diclofenac Sodium; Take 1 tablet (75  mg total) by mouth 2 (two) times daily.  Dispense: 30 tablet; Refill: 0 -     Sertraline HCl; Take 1 tablet (50 mg total) by mouth daily.  Dispense: 30 tablet; Refill: 3    No follow-ups on file.   Kerby Nora, MD

## 2022-12-26 NOTE — Assessment & Plan Note (Signed)
Chronic, status post left rotator cuff tear repair, continuing to have issues 3 months out from surgery.  She has follow-up in 6 days with her orthopedic doctor. She states she continues to have trouble sleeping at night from shoulder pain.  She continues to have limited range of motion in her shoulder.  She has had minimal improvement with no or ibuprofen and narcotics gave her side effects. Encouraged her to try diclofenac 75 mg p.o. twice daily  (with food)for pain and inflammation, she can review this with her orthopedic doctor prior to starting if she would prefer.

## 2022-12-27 ENCOUNTER — Ambulatory Visit: Payer: Managed Care, Other (non HMO) | Admitting: Physical Therapy

## 2022-12-30 ENCOUNTER — Encounter: Payer: Self-pay | Admitting: Physical Therapy

## 2022-12-30 ENCOUNTER — Ambulatory Visit: Payer: Managed Care, Other (non HMO) | Admitting: Physical Therapy

## 2022-12-30 DIAGNOSIS — M25512 Pain in left shoulder: Secondary | ICD-10-CM | POA: Diagnosis not present

## 2022-12-30 DIAGNOSIS — G8929 Other chronic pain: Secondary | ICD-10-CM

## 2022-12-30 NOTE — Therapy (Signed)
OUTPATIENT PHYSICAL THERAPY TREATMENT   Patient Name: Terry Camacho MRN: [id number] DOB:September 30, 1975, 47 y.o., female Today's Date: 12/30/2022  END OF SESSION:  PT End of Session - 12/30/22 1017     Visit Number 8    Number of Visits 24    Date for PT Re-Evaluation 02/12/23    Authorization Type [employer]    PT Start Time 1018    PT Stop Time 1110    PT Time Calculation (min) 52 min    Activity Tolerance Patient tolerated treatment well;Patient limited by pain    Behavior During Therapy [facility] for tasks assessed/performed                  Past Medical History:  Diagnosis Date   GERD (gastroesophageal reflux disease)    Hiatal hernia    Palpitations 11/18/2014   Past Surgical History:  Procedure Laterality Date   ESOPHAGOGASTRODUODENOSCOPY  10/14/2005   esophagitis   L shoulder rotator cuff repair Left 09/25/2022   Patient Active Problem List   Diagnosis Date Noted   Chronic left shoulder pain 12/26/2022   Acute intractable headache 03/15/2022   Chronic neck pain 03/15/2022   Trapezius strain 12/13/2021   MDD (major depressive disorder), recurrent episode, moderate (HCC) 04/02/2019   Family history of thyroid disease 03/19/2018   Class 1 obesity without serious comorbidity with body mass index (BMI) of 31.0 to 31.9 in adult 05/07/2016   History of palpitations 11/18/2014   GERD 04/28/2007    PCP: Excell Seltzer, MD  REFERRING PROVIDER: Teryl Lucy, MD   REFERRING DIAG: s/p L shoulder SAD, RCR and bicep tenodesis  THERAPY DIAG:  Chronic left shoulder pain  Rationale for Evaluation and Treatment: Rehabilitation  ONSET DATE: 09/25/2022  SUBJECTIVE:                                                                                                                                                                                      SUBJECTIVE STATEMENT: " I saw my PCP and I told her I was feeling sore which she stated could be related to arthritis, she  also prescribed some medication for inflammation.The shoulder is doing pretty much the same."   PERTINENT HISTORY: See hx  PAIN:  Are you having pain? Yes: NPRS scale: 6/10 Pain location: L shoulder,  Pain description: throbbing,  Aggravating factors: anything a sudden pain Relieving factors: heating pad, ibuprofen and resting.   PRECAUTIONS: Shoulder  RED FLAGS: None   WEIGHT BEARING RESTRICTIONS: Yes NWB   FALLS:  Has patient fallen in last 6 months? No  LIVING ENVIRONMENT: Lives with: lives with their family Lives in: House/apartment Stairs: Yes: Internal:  17 steps; on right going up Has following equipment at home:  sling  OCCUPATION: Facilities manager  PLOF: Independent  PATIENT GOALS: lift arm and get it moving like it should.   NEXT MD VISIT: 12/04/2022  OBJECTIVE:  Note: Objective measures were completed at Evaluation unless otherwise noted.  DIAGNOSTIC FINDINGS:  At MD's office.   PATIENT SURVEYS:  FOTO 12% and predicted 57% 12/18/2022  COGNITION: Overall cognitive status: Within functional limits for tasks assessed     SENSATION: WFL  POSTURE: FWD  UPPER EXTREMITY ROM:   Passive ROM  Right eval Left eval Left 12/13/22 Left  12/16/2022 Left 12/18/22 Left 12/30/22  Shoulder flexion  40 P! AROM 65 PROM 110 AAROM supine 105 AROM 102   Shoulder extension        Shoulder abduction  46 P!      Shoulder adduction        Shoulder internal rotation        Shoulder external rotation  4 P!      Elbow flexion  134      Elbow extension  2      Wrist flexion        Wrist extension        Wrist ulnar deviation        Wrist radial deviation        Wrist pronation        Wrist supination        (Blank rows = not tested)  Notes: Pain and guarding noted during assessment specifically with shoulder movement and elbow extension/ flexion requiring use of distraction technique of pressing LE calf/ shin together.   UPPER EXTREMITY MMT:  MMT  Right eval Left eval  Shoulder flexion    Shoulder extension    Shoulder abduction    Shoulder adduction    Shoulder internal rotation    Shoulder external rotation    Middle trapezius    Lower trapezius    Elbow flexion    Elbow extension    Wrist flexion    Wrist extension    Wrist ulnar deviation    Wrist radial deviation    Wrist pronation    Wrist supination    Grip strength (lbs)    (Blank rows = not tested)  JOINT MOBILITY TESTING:  Gross guarding and stiffness noted mpacting mobility assessment  PALPATION:  TTP along the anteior aspect of the shoulder along the mid/ proximal bicep brachii,  anterior deltoid, middle deltoid, sub-acromial space, as well as the infrapsinatus. Trigger points noted int he L upper trap / levator scapulae.    TREATMENT:                                                                                                                                         [facility] Adult PT Treatment:  DATE: 12/30/2022 Therapeutic Exercise: Pulleys flexion/scaption 2:30 sec L upper trap stretch 2 x 30 sec R sidelying L AAROM flexion using UE ranger on floor 2 x 12 R sidelying L AROM short lever abduction/ scaption 1 x 10 (combined with upward rotation scapular assist) Low load long duration stretch with elbows propped on table leaning forward and elbow flexed 2 x 3 min R sidelying L ER 2 x 10    Updated HEP for low load long duration stretch flexion stretch Manual Therapy: MTPR along the teres minor, and upper trap on the L PROM working into end range of motion flexion with oscillations ot reduce pain  GHJ mobs inferior grade III with elbow resting on table at 90 degrees Scapular mobs grade IV focus on upward/ downward rotation.   Modalities: MHP in sitting x 10 min L UE.    [facility] Adult PT Treatment:                                                DATE: 12/23/2022 Therapeutic Exercise: Pulley flexion 2:30 sec,  Scaption 2:30 Rolling black physioball up the wall using both arms 2 x 5  Dowel rod IR 2 x 10  Standing isometric shoulder press 1 x 10 holding 5 seconds Rows 2 x 10 with YTB Shoulder Rows/ IR/ER YTB 2 x 10 with towel under arm   Updated HEP for gentle dowel IR Manual Therapy: MTPR along the infraspinatus x 3  [facility] Adult PT Treatment:                                                DATE: 12/18/22 Therapeutic Exercise: Supine AAROM Chest press with plus x 20 with distraction technique Supine over head flexion AAROM short Lever x 20 with  L upper trap stretch 2 x 30 sec  Seated L shoulder low load long duration ER stretch with elbow resting on table utilizing trunk lean, table elevated to 90 degrees of abduction UBE L1 x 4 min (FWD/ BWD 2 min ea) cues for gentle smooth movement Wall ladder flexion x 3 and abduction x 3  Modalities: Ice pack x 10 min L shoulder in sitting   PATIENT EDUCATION: Education details: Evaluation findings, POC, goals, HEP with proper form/ rationale.  Person educated: Patient Education method: Explanation, Verbal cues, and Handouts Education comprehension: verbalized understanding  HOME EXERCISE PROGRAM: Access Code: [email or web address] URL: [email or web address] Date: 12/30/2022 Prepared by: Lulu Riding  Exercises - Seated Upper Trapezius Stretch  - 1 x daily - 7 x weekly - 2 sets - 2 reps - 30 seconds hold - Gentle Levator Scapulae Stretch  - 1 x daily - 7 x weekly - 2 sets - 2 reps - 30 seconds hold - Shoulder Table Slides Flexion  - 2 x daily - 3 sets - 10 reps - 5 hold - Shoulder table slides ABD  - 2 x daily - 3 sets - 10 reps - 5 hold - Seated Gripping Towel  - 1 x daily - 7 x weekly - 2 sets - 10 reps - Supine Elbow Flexion Extension AROM  - 1 x daily - 7 x weekly - 2 sets - 10 reps - Standing Shoulder External Rotation AAROM with Dowel  -  1 x daily - 7 x weekly - 2 sets - 10 reps - Shoulder Flexion Overhead with Dowel  - 1 x daily -  7 x weekly - 2 sets - 10 reps - Standing Shoulder Abduction AAROM with Dowel  - 1 x daily - 7 x weekly - 2 sets - 10 reps - Standing Isometric Shoulder Internal Rotation at Doorway (Mirrored)  - 1 x daily - 7 x weekly - 2 sets - 10 reps - 10 seconds hold - Standing Isometric Shoulder Flexion with Doorway - Arm Bent  - 1 x daily - 7 x weekly - 2 sets - 10 reps - 10 seconds hold - Standing Isometric Shoulder External Rotation with Doorway and Towel Roll  - 1 x daily - 7 x weekly - 2 sets - 10 reps - 10 seconds hold - Standing Isometric Shoulder Extension with Doorway - Arm Bent  - 1 x daily - 7 x weekly - 2 sets - 10 reps - seconds hold - Standing Isometric Shoulder Abduction with Doorway - Arm Bent  - 1 x daily - 7 x weekly - 2 sets - 10 reps - 10 seconds hold - Seated Shoulder External Rotation PROM on Table  - 3 x daily - 7 x weekly - 2 sets - 10 reps - 30-60 sec hold - Standing Bilateral Shoulder Internal Rotation AAROM with Dowel  - 1 x daily - 7 x weekly - 2 sets - 10 reps - 5 sec hold - Prone Chest Stretch on Chair  - 1 x daily - 7 x weekly - 1 sets - 3-5 reps - 2 min hold  ASSESSMENT:  CLINICAL IMPRESSION: 12/30/2022 Terry Camacho reported feeling sore after the last session, and continues to report elevated pain at 6/10 with no relief. She did see her PCP which she was prescribed an anti-inflammatory for. Continued focus on ROM with PROM, AAROM and AROM with gravity eliminated,  which she does continue to guard limiting her mobility. Utilized MHP end of session which she noted feeling better with.   Assessment at Evaluation: Patient is a 47 y.o. F who was seen today for physical therapy evaluation and treatment for L shoulder s/p RCR, Bicep Tenodesis and SAD on 09/26/22. She is 8 weeks post op today and saw her MD on 10/2 releasing her from her sling. She has limited PROM in all planes secondary to pain and guarding, MMT was withheld due to precautions. Discussed letting her arm hang down vs  keeping  her elbow flexed at her side. Provided initial HEP  and pt noted soreness had reduced compared to when she arrived. She would benefit from physical therapy to reduce L shoulder pain, increase ROM and strength, improve LUE strength and overall function by addressing the deficits listed.    OBJECTIVE IMPAIRMENTS: decreased activity tolerance, decreased endurance, decreased ROM, decreased strength, increased edema, increased fascial restrictions, increased muscle spasms, impaired flexibility, impaired UE functional use, improper body mechanics, postural dysfunction, and pain.   ACTIVITY LIMITATIONS: carrying, lifting, and reach over head  PARTICIPATION LIMITATIONS: cleaning, laundry, community activity, occupation, and Exercise  PERSONAL FACTORS: Past/current experiences and Time since onset of injury/illness/exacerbation are also affecting patient's functional outcome.   REHAB POTENTIAL: Excellent  CLINICAL DECISION MAKING: Stable/uncomplicated  EVALUATION COMPLEXITY: Low   GOALS: Goals reviewed with patient? Yes  SHORT TERM GOALS: Target date: 01/01/2023    Pt to be IND with initial HEP for therapeutic progression Baseline: Goal status: INITIAL  2.  Increase L  shoulder AAROM Flexion/ abduction to >/= 120 degrees with </= 5/10 pain for progression of ROM Baseline:  Goal status: INITIAL  3.  Improve FOTO score to >/= 30% to demonstrate improving function Baseline:  Goal status: INITIAL  4.  Pt to demonstrate LUE strength to >/= 3/5 to demonstrate progressing strength and function Baseline:  Goal status: INITIAL  LONG TERM GOALS: Target date: 02/12/2023   Pt have functional L shoulder AROM in all planes compared bil with no report of pain or limitatoins Baseline:  Goal status: INITIAL  2.  Pt to be a lift and lower >/= 10# to and from a overhead shelf with </= 2/10 pain or functional strength required for Adls Baseline:  Goal status: INITIAL  3.  Pt to be able to  lift/ lower >/= 20# to / from waist to floor for functional lifting and strengthening Baseline:  Goal status: INITIAL  4.  Pt be able to push/ pull >/= 20# with no report of pain or restrictions and be able to return to personal workout routine with gradual progression without pain Baseline:  Goal status: INITIAL  5.  Pt to increase FOTO score to >/= 57% to demonstrate improvement in function Baseline:  Goal status: INITIAL  6.  Pt to be IND with all HEP and able to maintain and progress current LOF IND  Baseline:  Goal status: INITIAL  PLAN:  PT FREQUENCY: 1-2x/week  PT DURATION: 12 weeks  PLANNED INTERVENTIONS: 97164- PT Re-evaluation, 97110-Therapeutic exercises, 97530- Therapeutic activity, [id number]- Neuromuscular re-education, 97535- Self Care, [id number]- Manual therapy, [id number]- Aquatic Therapy, 97014- Electrical stimulation (unattended), 97016- Vasopneumatic device, Taping, Dry Needling, Joint mobilization, Scar mobilization, Cryotherapy, and Moist heat  PLAN FOR NEXT SESSION: review/ update HEP PRN. 8 weeks out at eval, shoulder mobs, PROM/ AAROM, STW in the shoulder/ bicep, continue gentile isometrics.  Delsie Amador PT, DPT, LAT, ATC  12/30/22  11:24 AM

## 2023-01-01 ENCOUNTER — Encounter: Payer: Self-pay | Admitting: Physical Therapy

## 2023-01-01 ENCOUNTER — Ambulatory Visit: Payer: Managed Care, Other (non HMO) | Admitting: Physical Therapy

## 2023-01-01 DIAGNOSIS — G8929 Other chronic pain: Secondary | ICD-10-CM

## 2023-01-01 DIAGNOSIS — R29898 Other symptoms and signs involving the musculoskeletal system: Secondary | ICD-10-CM

## 2023-01-01 DIAGNOSIS — M25512 Pain in left shoulder: Secondary | ICD-10-CM | POA: Diagnosis not present

## 2023-01-01 NOTE — Therapy (Signed)
OUTPATIENT PHYSICAL THERAPY TREATMENT   Patient Name: Terry Camacho MRN: [id number] DOB:May 09, 1975, 47 y.o., female Today's Date: 01/01/2023  END OF SESSION:  PT End of Session - 01/01/23 1102     Visit Number 9    Number of Visits 24    Date for PT Re-Evaluation 02/12/23    Authorization Type [employer]    PT Start Time 1102    PT Stop Time 1201    PT Time Calculation (min) 59 min    Activity Tolerance Patient tolerated treatment well;Patient limited by pain    Behavior During Therapy [facility] for tasks assessed/performed                   Past Medical History:  Diagnosis Date   GERD (gastroesophageal reflux disease)    Hiatal hernia    Palpitations 11/18/2014   Past Surgical History:  Procedure Laterality Date   ESOPHAGOGASTRODUODENOSCOPY  10/14/2005   esophagitis   L shoulder rotator cuff repair Left 09/25/2022   Patient Active Problem List   Diagnosis Date Noted   Chronic left shoulder pain 12/26/2022   Acute intractable headache 03/15/2022   Chronic neck pain 03/15/2022   Trapezius strain 12/13/2021   MDD (major depressive disorder), recurrent episode, moderate (HCC) 04/02/2019   Family history of thyroid disease 03/19/2018   Class 1 obesity without serious comorbidity with body mass index (BMI) of 31.0 to 31.9 in adult 05/07/2016   History of palpitations 11/18/2014   GERD 04/28/2007    PCP: Excell Seltzer, MD  REFERRING PROVIDER: Teryl Lucy, MD   REFERRING DIAG: s/p L shoulder SAD, RCR and bicep tenodesis  THERAPY DIAG:  Chronic left shoulder pain  Weakness of left shoulder  Rationale for Evaluation and Treatment: Rehabilitation  ONSET DATE: 09/25/2022  SUBJECTIVE:                                                                                                                                                                                      SUBJECTIVE STATEMENT: "No major changes after the last session, some soreness after the last  session."   PERTINENT HISTORY: See hx  PAIN:  Are you having pain? Yes: NPRS scale: 6/10 Pain location: L shoulder,  Pain description: throbbing,  Aggravating factors: anything a sudden pain Relieving factors: heating pad, ibuprofen and resting.   PRECAUTIONS: Shoulder  RED FLAGS: None   WEIGHT BEARING RESTRICTIONS: Yes NWB   FALLS:  Has patient fallen in last 6 months? No  LIVING ENVIRONMENT: Lives with: lives with their family Lives in: House/apartment Stairs: Yes: Internal: 17 steps; on right going up Has following equipment at home:  sling  OCCUPATION: Front  desk register  PLOF: Independent  PATIENT GOALS: lift arm and get it moving like it should.   NEXT MD VISIT: 12/04/2022  OBJECTIVE:  Note: Objective measures were completed at Evaluation unless otherwise noted.  DIAGNOSTIC FINDINGS:  At MD's office.   PATIENT SURVEYS:  FOTO 12% and predicted 57% 12/18/2022 4%  COGNITION: Overall cognitive status: Within functional limits for tasks assessed     SENSATION: WFL  POSTURE: FWD  UPPER EXTREMITY ROM:   Passive ROM  Right eval Left eval Left 12/13/22 Left  12/16/2022 Left 12/18/22 Left 01/01/23  Shoulder flexion  40 P! AROM 65 PROM 110 AAROM supine 105 AROM 102 supine [id number] ! seated  Shoulder extension        Shoulder abduction  46 P!    AROM 68 ! Seated  Shoulder adduction        Shoulder internal rotation        Shoulder external rotation  4 P!    12 !seated   Elbow flexion  134    140  Elbow extension  2    12  Wrist flexion        Wrist extension        Wrist ulnar deviation        Wrist radial deviation        Wrist pronation        Wrist supination        (Blank rows = not tested)  Notes: Pain and guarding noted during assessment specifically with shoulder movement and elbow extension/ flexion requiring use of distraction technique of pressing LE calf/ shin together.   01/01/2023 Note: AAROM with dowel rod  in standing   flexion 111 degrees, abduction 75 degrees      UPPER EXTREMITY MMT:  MMT Right eval Left eval  Shoulder flexion    Shoulder extension    Shoulder abduction    Shoulder adduction    Shoulder internal rotation    Shoulder external rotation    Middle trapezius    Lower trapezius    Elbow flexion    Elbow extension    Wrist flexion    Wrist extension    Wrist ulnar deviation    Wrist radial deviation    Wrist pronation    Wrist supination    Grip strength (lbs)    (Blank rows = not tested)  JOINT MOBILITY TESTING:  Gross guarding and stiffness noted mpacting mobility assessment  PALPATION:  TTP along the anteior aspect of the shoulder along the mid/ proximal bicep brachii,  anterior deltoid, middle deltoid, sub-acromial space, as well as the infrapsinatus. Trigger points noted int he L upper trap / levator scapulae.    TREATMENT:                                                                                                                                         [facility]  Adult PT Treatment:                                                DATE: 01/01/2023 Therapeutic Exercise: Low load long durationg stretch of bicep with towel under elbow to promote extension 2 x 2 min  Quadruped rocking with holding bil UE on end of table. 2 x 10 holding on the 10th rep performing gentle disctraction (utilizing body to pulsate)  Quadruped position rocking laterally 2 x 20 working stiffness/ available motion Quadruped protraction bil UE - pt had trouble performing so modified to standing at the wall which she was able to perform properly with demonstration AAROM wall walks abduction  Towel IR across the back working into end range  Therapeutic Activity: Reviewed ROM and effect it has on her ADLS and progress that has been made thus far with treatment, and importance of continued work/ treatment. Modalities: E-stim for L shoulder x 12 min, 80 - 120 frequency, level 9 MHP in sitting for the L  shoulder   [facility] Adult PT Treatment:                                                DATE: 12/30/2022 Therapeutic Exercise: Pulleys flexion/scaption 2:30 sec L upper trap stretch 2 x 30 sec R sidelying L AAROM flexion using UE ranger on floor 2 x 12 R sidelying L AROM short lever abduction/ scaption 1 x 10 (combined with upward rotation scapular assist) Low load long duration stretch with elbows propped on table leaning forward and elbow flexed 2 x 3 min R sidelying L ER 2 x 10    Updated HEP for low load long duration stretch flexion stretch Manual Therapy: MTPR along the teres minor, and upper trap on the L PROM working into end range of motion flexion with oscillations ot reduce pain  GHJ mobs inferior grade III with elbow resting on table at 90 degrees Scapular mobs grade IV focus on upward/ downward rotation.   Modalities: MHP in sitting x 10 min L UE.    [facility] Adult PT Treatment:                                                DATE: 12/23/2022 Therapeutic Exercise: Pulley flexion 2:30 sec, Scaption 2:30 Rolling black physioball up the wall using both arms 2 x 5  Dowel rod IR 2 x 10  Standing isometric shoulder press 1 x 10 holding 5 seconds Rows 2 x 10 with YTB Shoulder Rows/ IR/ER YTB 2 x 10 with towel under arm   Updated HEP for gentle dowel IR Manual Therapy: MTPR along the infraspinatus x 3    PATIENT EDUCATION: Education details: Evaluation findings, POC, goals, HEP with proper form/ rationale.  Person educated: Patient Education method: Explanation, Verbal cues, and Handouts Education comprehension: verbalized understanding  HOME EXERCISE PROGRAM: Access Code: [email or web address] URL: [email or web address] Date: 12/30/2022 Prepared by: Lulu Riding  Exercises - Seated Upper Trapezius Stretch  - 1 x daily - 7 x weekly - 2 sets - 2 reps - 30 seconds hold - Gentle Levator Scapulae  Stretch  - 1 x daily - 7 x weekly - 2 sets - 2 reps - 30 seconds hold -  Shoulder Table Slides Flexion  - 2 x daily - 3 sets - 10 reps - 5 hold - Shoulder table slides ABD  - 2 x daily - 3 sets - 10 reps - 5 hold - Seated Gripping Towel  - 1 x daily - 7 x weekly - 2 sets - 10 reps - Supine Elbow Flexion Extension AROM  - 1 x daily - 7 x weekly - 2 sets - 10 reps - Standing Shoulder External Rotation AAROM with Dowel  - 1 x daily - 7 x weekly - 2 sets - 10 reps - Shoulder Flexion Overhead with Dowel  - 1 x daily - 7 x weekly - 2 sets - 10 reps - Standing Shoulder Abduction AAROM with Dowel  - 1 x daily - 7 x weekly - 2 sets - 10 reps - Standing Isometric Shoulder Internal Rotation at Doorway (Mirrored)  - 1 x daily - 7 x weekly - 2 sets - 10 reps - 10 seconds hold - Standing Isometric Shoulder Flexion with Doorway - Arm Bent  - 1 x daily - 7 x weekly - 2 sets - 10 reps - 10 seconds hold - Standing Isometric Shoulder External Rotation with Doorway and Towel Roll  - 1 x daily - 7 x weekly - 2 sets - 10 reps - 10 seconds hold - Standing Isometric Shoulder Extension with Doorway - Arm Bent  - 1 x daily - 7 x weekly - 2 sets - 10 reps - seconds hold - Standing Isometric Shoulder Abduction with Doorway - Arm Bent  - 1 x daily - 7 x weekly - 2 sets - 10 reps - 10 seconds hold - Seated Shoulder External Rotation PROM on Table  - 3 x daily - 7 x weekly - 2 sets - 10 reps - 30-60 sec hold - Standing Bilateral Shoulder Internal Rotation AAROM with Dowel  - 1 x daily - 7 x weekly - 2 sets - 10 reps - 5 sec hold - Prone Chest Stretch on Chair  - 1 x daily - 7 x weekly - 1 sets - 3-5 reps - 2 min hold  ASSESSMENT:  CLINICAL IMPRESSION: 01/01/2023 Terry Camacho is demonstrating minimal progress toward her short term goals meting STG #1 today however continues to exhibit pain at rest and with activity generally residing around a 6/10 with no specific position of relief or comfort. She is 14 weeks out today and continues to demonstrate significant challenges with AROM/ AAROM in all planes with  compensation and guarding, today using dowel she was able to get 111 degrees of flexion, and 75 degrees of abduction with verbal cues to avoid compensation. Continued to review importance of use of her LUE throughout the day for general tasks to promote use of activity and reduce pain. Trialed use of E-stim and MHP end of session to calm down soreness an pain end of session. Patient sees her MD today for follow up, plan to see how everything is going next session.   Assessment at Evaluation: Patient is a 47 y.o. F who was seen today for physical therapy evaluation and treatment for L shoulder s/p RCR, Bicep Tenodesis and SAD on 09/26/22. She is 8 weeks post op today and saw her MD on 10/2 releasing her from her sling. She has limited PROM in all planes secondary to pain and guarding, MMT was  withheld due to precautions. Discussed letting her arm hang down vs keeping  her elbow flexed at her side. Provided initial HEP  and pt noted soreness had reduced compared to when she arrived. She would benefit from physical therapy to reduce L shoulder pain, increase ROM and strength, improve LUE strength and overall function by addressing the deficits listed.    OBJECTIVE IMPAIRMENTS: decreased activity tolerance, decreased endurance, decreased ROM, decreased strength, increased edema, increased fascial restrictions, increased muscle spasms, impaired flexibility, impaired UE functional use, improper body mechanics, postural dysfunction, and pain.   ACTIVITY LIMITATIONS: carrying, lifting, and reach over head  PARTICIPATION LIMITATIONS: cleaning, laundry, community activity, occupation, and Exercise  PERSONAL FACTORS: Past/current experiences and Time since onset of injury/illness/exacerbation are also affecting patient's functional outcome.   REHAB POTENTIAL: Excellent  CLINICAL DECISION MAKING: Stable/uncomplicated  EVALUATION COMPLEXITY: Low   GOALS: Goals reviewed with patient? Yes  SHORT TERM GOALS:  Target date: 01/01/2023  Pt to be IND with initial HEP for therapeutic progression Baseline: Goal status: MET 01/01/2023  2.  Increase L shoulder AAROM Flexion/ abduction to >/= 120 degrees with </= 5/10 pain for progression of ROM Baseline:  Goal status: Ongoing 01/01/2023  3.  Improve FOTO score to >/= 30% to demonstrate improving function Baseline:  Goal status: Ongoing 01/01/2023  4.  Pt to demonstrate LUE strength to >/= 3/5 to demonstrate progressing strength and function Baseline:  Status: unable to assess accurately due to pain/ guarding Goal status: Ongoing 01/01/2023  LONG TERM GOALS: Target date: 02/12/2023   Pt have functional L shoulder AROM in all planes compared bil with no report of pain or limitatoins Baseline:  Goal status: INITIAL  2.  Pt to be a lift and lower >/= 10# to and from a overhead shelf with </= 2/10 pain or functional strength required for Adls Baseline:  Goal status: INITIAL  3.  Pt to be able to lift/ lower >/= 20# to / from waist to floor for functional lifting and strengthening Baseline:  Goal status: INITIAL  4.  Pt be able to push/ pull >/= 20# with no report of pain or restrictions and be able to return to personal workout routine with gradual progression without pain Baseline:  Goal status: INITIAL  5.  Pt to increase FOTO score to >/= 57% to demonstrate improvement in function Baseline:  Goal status: INITIAL  6.  Pt to be IND with all HEP and able to maintain and progress current LOF IND  Baseline:  Goal status: INITIAL  PLAN:  PT FREQUENCY: 1-2x/week  PT DURATION: 12 weeks  PLANNED INTERVENTIONS: 97164- PT Re-evaluation, 97110-Therapeutic exercises, 97530- Therapeutic activity, [id number]- Neuromuscular re-education, 97535- Self Care, 11914- Manual therapy, [id number]- Aquatic Therapy, 97014- Electrical stimulation (unattended), 97016- Vasopneumatic device, Taping, Dry Needling, Joint mobilization, Scar mobilization, Cryotherapy, and  Moist heat  PLAN FOR NEXT SESSION: review/ update HEP PRN. 8 weeks out at eval, shoulder mobs, PROM/ AAROM, STW in the shoulder/ bicep, continue gentile isometrics.  Adiah Guereca PT, DPT, LAT, ATC  01/01/23  11:50 AM

## 2023-01-06 ENCOUNTER — Encounter: Payer: Self-pay | Admitting: Physical Therapy

## 2023-01-06 ENCOUNTER — Ambulatory Visit: Payer: Managed Care, Other (non HMO) | Attending: Orthopedic Surgery | Admitting: Physical Therapy

## 2023-01-06 DIAGNOSIS — R29898 Other symptoms and signs involving the musculoskeletal system: Secondary | ICD-10-CM | POA: Diagnosis present

## 2023-01-06 DIAGNOSIS — M25512 Pain in left shoulder: Secondary | ICD-10-CM | POA: Insufficient documentation

## 2023-01-06 DIAGNOSIS — G8929 Other chronic pain: Secondary | ICD-10-CM | POA: Insufficient documentation

## 2023-01-06 NOTE — Therapy (Signed)
OUTPATIENT PHYSICAL THERAPY TREATMENT   Patient Name: Terry Camacho MRN: [id number] DOB:Jun 24, 1975, 47 y.o., female Today's Date: 01/06/2023  END OF SESSION:  PT End of Session - 01/06/23 0929     Visit Number 10    Number of Visits 24    Date for PT Re-Evaluation 02/12/23    Authorization Type [employer]    PT Start Time 0930    PT Stop Time 1015    PT Time Calculation (min) 45 min                   Past Medical History:  Diagnosis Date   GERD (gastroesophageal reflux disease)    Hiatal hernia    Palpitations 11/18/2014   Past Surgical History:  Procedure Laterality Date   ESOPHAGOGASTRODUODENOSCOPY  10/14/2005   esophagitis   L shoulder rotator cuff repair Left 09/25/2022   Patient Active Problem List   Diagnosis Date Noted   Chronic left shoulder pain 12/26/2022   Acute intractable headache 03/15/2022   Chronic neck pain 03/15/2022   Trapezius strain 12/13/2021   MDD (major depressive disorder), recurrent episode, moderate (HCC) 04/02/2019   Family history of thyroid disease 03/19/2018   Class 1 obesity without serious comorbidity with body mass index (BMI) of 31.0 to 31.9 in adult 05/07/2016   History of palpitations 11/18/2014   GERD 04/28/2007    PCP: Excell Seltzer, MD  REFERRING PROVIDER: Teryl Lucy, MD   REFERRING DIAG: s/p L shoulder SAD, RCR and bicep tenodesis  THERAPY DIAG:  Chronic left shoulder pain  Weakness of left shoulder  Rationale for Evaluation and Treatment: Rehabilitation  ONSET DATE: 09/25/2022  SUBJECTIVE:                                                                                                                                                                                      SUBJECTIVE STATEMENT: Diclofenac prescribed for 2 x per day from primary Dr, started it 1 week ago.  Less stiffness but still pain. Saw  Surgeon on 11/27 who will order MRI in 2-3 weeks if no better. Pain level unchanged.     PERTINENT HISTORY: See hx  PAIN:  Are you having pain? Yes: NPRS scale: 6/10 Pain location: L shoulder, anterior and posterior Pain description: throbbing,  Aggravating factors: anything a sudden pain Relieving factors: heating pad, ibuprofen and resting.   PRECAUTIONS: Shoulder  RED FLAGS: None   WEIGHT BEARING RESTRICTIONS: Yes NWB   FALLS:  Has patient fallen in last 6 months? No  LIVING ENVIRONMENT: Lives with: lives with their family Lives in: House/apartment Stairs: Yes: Internal: 17 steps; on right going up Has following equipment  at home:  sling  OCCUPATION: Facilities manager  PLOF: Independent  PATIENT GOALS: lift arm and get it moving like it should.   NEXT MD VISIT: 12/04/2022  OBJECTIVE:  Note: Objective measures were completed at Evaluation unless otherwise noted.  DIAGNOSTIC FINDINGS:  At MD's office.   PATIENT SURVEYS:  FOTO 12% and predicted 57% 12/18/2022 4%  COGNITION: Overall cognitive status: Within functional limits for tasks assessed     SENSATION: WFL  POSTURE: FWD  UPPER EXTREMITY ROM:   Passive ROM  Right eval Left eval Left 12/13/22 Left  12/16/2022 Left 12/18/22 Left 01/01/23 Left  01/06/23  Shoulder flexion  40 P! AROM 65 PROM 110 AAROM supine 105 AROM 102 supine AROM92 ! seated Supine 120 AA Standing: 100  Shoulder extension         Shoulder abduction  46 P!    AROM 68 ! Seated Standing: 75 SL AROM 90  Shoulder adduction         Shoulder internal rotation        Reach to L5  Shoulder external rotation  4 P!    12 !seated  Reach back of neck   Elbow flexion  134    140   Elbow extension  2    12   Wrist flexion         Wrist extension         Wrist ulnar deviation         Wrist radial deviation         Wrist pronation         Wrist supination         (Blank rows = not tested)  Notes: Pain and guarding noted during assessment specifically with shoulder movement and elbow extension/ flexion requiring  use of distraction technique of pressing LE calf/ shin together.   01/01/2023 Note: AAROM with dowel rod  in standing  flexion 111 degrees, abduction 75 degrees      UPPER EXTREMITY MMT:  MMT Right eval Left eval  Shoulder flexion    Shoulder extension    Shoulder abduction    Shoulder adduction    Shoulder internal rotation    Shoulder external rotation    Middle trapezius    Lower trapezius    Elbow flexion    Elbow extension    Wrist flexion    Wrist extension    Wrist ulnar deviation    Wrist radial deviation    Wrist pronation    Wrist supination    Grip strength (lbs)    (Blank rows = not tested)  JOINT MOBILITY TESTING:  Gross guarding and stiffness noted mpacting mobility assessment  PALPATION:  TTP along the anteior aspect of the shoulder along the mid/ proximal bicep brachii,  anterior deltoid, middle deltoid, sub-acromial space, as well as the infrapsinatus. Trigger points noted int he L upper trap / levator scapulae.    TREATMENT:  [facility] Adult PT Treatment:                                                DATE: 01/06/23 Therapeutic Exercise: Pulleys  Supine chest press  Supine pullovers  Supine AAROM horiz abdct addct with dowel  Supine AAROM ER with dowel  Supine isometric ER, IR, Ext - manual resist from therapist, sub max  Supine protraction AROM Supine bicep stretch SL Shoulder abduction with elbow flexed SL shoulder abduction  Standing shoulder AAROM flex with dowel Standing shoulder AAROM abdct with dowel      [facility] Adult PT Treatment:                                                DATE: 01/01/2023 Therapeutic Exercise: Low load long durationg stretch of bicep with towel under elbow to promote extension 2 x 2 min  Quadruped rocking with holding bil UE on end of table. 2 x 10 holding on the 10th rep performing  gentle disctraction (utilizing body to pulsate)  Quadruped position rocking laterally 2 x 20 working stiffness/ available motion Quadruped protraction bil UE - pt had trouble performing so modified to standing at the wall which she was able to perform properly with demonstration AAROM wall walks abduction  Towel IR across the back working into end range  Therapeutic Activity: Reviewed ROM and effect it has on her ADLS and progress that has been made thus far with treatment, and importance of continued work/ treatment. Modalities: E-stim for L shoulder x 12 min, 80 - 120 frequency, level 9 MHP in sitting for the L shoulder   [facility] Adult PT Treatment:                                                DATE: 12/30/2022 Therapeutic Exercise: Pulleys flexion/scaption 2:30 sec L upper trap stretch 2 x 30 sec R sidelying L AAROM flexion using UE ranger on floor 2 x 12 R sidelying L AROM short lever abduction/ scaption 1 x 10 (combined with upward rotation scapular assist) Low load long duration stretch with elbows propped on table leaning forward and elbow flexed 2 x 3 min R sidelying L ER 2 x 10    Updated HEP for low load long duration stretch flexion stretch Manual Therapy: MTPR along the teres minor, and upper trap on the L PROM working into end range of motion flexion with oscillations ot reduce pain  GHJ mobs inferior grade III with elbow resting on table at 90 degrees Scapular mobs grade IV focus on upward/ downward rotation.   Modalities: MHP in sitting x 10 min L UE.    [facility] Adult PT Treatment:                                                DATE: 12/23/2022 Therapeutic Exercise: Pulley flexion 2:30 sec, Scaption 2:30 Rolling black physioball up the wall using both arms 2 x 5  Dowel  rod IR 2 x 10  Standing isometric shoulder press 1 x 10 holding 5 seconds Rows 2 x 10 with YTB Shoulder Rows/ IR/ER YTB 2 x 10 with towel under arm   Updated HEP for gentle dowel IR Manual  Therapy: MTPR along the infraspinatus x 3    PATIENT EDUCATION: Education details: Evaluation findings, POC, goals, HEP with proper form/ rationale.  Person educated: Patient Education method: Explanation, Verbal cues, and Handouts Education comprehension: verbalized understanding  HOME EXERCISE PROGRAM: Access Code: [email or web address] URL: [email or web address] Date: 12/30/2022 Prepared by: Lulu Riding  Exercises - Seated Upper Trapezius Stretch  - 1 x daily - 7 x weekly - 2 sets - 2 reps - 30 seconds hold - Gentle Levator Scapulae Stretch  - 1 x daily - 7 x weekly - 2 sets - 2 reps - 30 seconds hold - Shoulder Table Slides Flexion  - 2 x daily - 3 sets - 10 reps - 5 hold - Shoulder table slides ABD  - 2 x daily - 3 sets - 10 reps - 5 hold - Seated Gripping Towel  - 1 x daily - 7 x weekly - 2 sets - 10 reps - Supine Elbow Flexion Extension AROM  - 1 x daily - 7 x weekly - 2 sets - 10 reps - Standing Shoulder External Rotation AAROM with Dowel  - 1 x daily - 7 x weekly - 2 sets - 10 reps - Shoulder Flexion Overhead with Dowel  - 1 x daily - 7 x weekly - 2 sets - 10 reps - Standing Shoulder Abduction AAROM with Dowel  - 1 x daily - 7 x weekly - 2 sets - 10 reps - Standing Isometric Shoulder Internal Rotation at Doorway (Mirrored)  - 1 x daily - 7 x weekly - 2 sets - 10 reps - 10 seconds hold - Standing Isometric Shoulder Flexion with Doorway - Arm Bent  - 1 x daily - 7 x weekly - 2 sets - 10 reps - 10 seconds hold - Standing Isometric Shoulder External Rotation with Doorway and Towel Roll  - 1 x daily - 7 x weekly - 2 sets - 10 reps - 10 seconds hold - Standing Isometric Shoulder Extension with Doorway - Arm Bent  - 1 x daily - 7 x weekly - 2 sets - 10 reps - seconds hold - Standing Isometric Shoulder Abduction with Doorway - Arm Bent  - 1 x daily - 7 x weekly - 2 sets - 10 reps - 10 seconds hold - Seated Shoulder External Rotation PROM on Table  - 3 x daily - 7 x weekly - 2  sets - 10 reps - 30-60 sec hold - Standing Bilateral Shoulder Internal Rotation AAROM with Dowel  - 1 x daily - 7 x weekly - 2 sets - 10 reps - 5 sec hold - Prone Chest Stretch on Chair  - 1 x daily - 7 x weekly - 1 sets - 3-5 reps - 2 min hold  ASSESSMENT:  CLINICAL IMPRESSION: 01/06/2023 Yariely verbalized no change since last session. She did start an NSAID medication 1 week ago with minimal improvement. Her Surgeon plans to order shoulder MRI if she is not improved in next 1-2 weeks. Continued per PT POC for post op RCR shoulder progression. Pt tolerates AROM, AAROM, and isometrics with increased pain reported at end ranges.    Assessment at Evaluation: Patient is a 47 y.o. F who was seen today for physical therapy evaluation and  treatment for L shoulder s/p RCR, Bicep Tenodesis and SAD on 09/26/22. She is 8 weeks post op today and saw her MD on 10/2 releasing her from her sling. She has limited PROM in all planes secondary to pain and guarding, MMT was withheld due to precautions. Discussed letting her arm hang down vs keeping  her elbow flexed at her side. Provided initial HEP  and pt noted soreness had reduced compared to when she arrived. She would benefit from physical therapy to reduce L shoulder pain, increase ROM and strength, improve LUE strength and overall function by addressing the deficits listed.    OBJECTIVE IMPAIRMENTS: decreased activity tolerance, decreased endurance, decreased ROM, decreased strength, increased edema, increased fascial restrictions, increased muscle spasms, impaired flexibility, impaired UE functional use, improper body mechanics, postural dysfunction, and pain.   ACTIVITY LIMITATIONS: carrying, lifting, and reach over head  PARTICIPATION LIMITATIONS: cleaning, laundry, community activity, occupation, and Exercise  PERSONAL FACTORS: Past/current experiences and Time since onset of injury/illness/exacerbation are also affecting patient's functional outcome.    REHAB POTENTIAL: Excellent  CLINICAL DECISION MAKING: Stable/uncomplicated  EVALUATION COMPLEXITY: Low   GOALS: Goals reviewed with patient? Yes  SHORT TERM GOALS: Target date: 01/01/2023  Pt to be IND with initial HEP for therapeutic progression Baseline: Goal status: MET 01/01/2023  2.  Increase L shoulder AAROM Flexion/ abduction to >/= 120 degrees with </= 5/10 pain for progression of ROM Baseline:  Goal status: Ongoing 01/01/2023  3.  Improve FOTO score to >/= 30% to demonstrate improving function Baseline: 12% 12/18/22: 4% Goal status: Ongoing 01/01/2023  4.  Pt to demonstrate LUE strength to >/= 3/5 to demonstrate progressing strength and function Baseline:  Status: unable to assess accurately due to pain/ guarding Goal status: Ongoing 01/01/2023  LONG TERM GOALS: Target date: 02/12/2023   Pt have functional L shoulder AROM in all planes compared bil with no report of pain or limitatoins Baseline:  Goal status: INITIAL  2.  Pt to be a lift and lower >/= 10# to and from a overhead shelf with </= 2/10 pain or functional strength required for Adls Baseline:  Goal status: INITIAL  3.  Pt to be able to lift/ lower >/= 20# to / from waist to floor for functional lifting and strengthening Baseline:  Goal status: INITIAL  4.  Pt be able to push/ pull >/= 20# with no report of pain or restrictions and be able to return to personal workout routine with gradual progression without pain Baseline:  Goal status: INITIAL  5.  Pt to increase FOTO score to >/= 57% to demonstrate improvement in function Baseline:  Goal status: INITIAL  6.  Pt to be IND with all HEP and able to maintain and progress current LOF IND  Baseline:  Goal status: INITIAL  PLAN:  PT FREQUENCY: 1-2x/week  PT DURATION: 12 weeks  PLANNED INTERVENTIONS: 97164- PT Re-evaluation, 97110-Therapeutic exercises, 97530- Therapeutic activity, [id number]- Neuromuscular re-education, 97535- Self Care,  97140- Manual therapy, [id number]- Aquatic Therapy, 97014- Electrical stimulation (unattended), 97016- Vasopneumatic device, Taping, Dry Needling, Joint mobilization, Scar mobilization, Cryotherapy, and Moist heat  PLAN FOR NEXT SESSION: review/ update HEP PRN. 8 weeks out at eval, shoulder mobs, PROM/ AAROM, STW in the shoulder/ bicep, continue gentile isometrics.  Jannette Spanner, PTA 01/06/23 12:10 PM Phone: [phone] Fax: [phone]

## 2023-01-10 ENCOUNTER — Encounter: Payer: Self-pay | Admitting: Physical Therapy

## 2023-01-10 ENCOUNTER — Ambulatory Visit: Payer: Managed Care, Other (non HMO) | Admitting: Physical Therapy

## 2023-01-10 DIAGNOSIS — M25512 Pain in left shoulder: Secondary | ICD-10-CM | POA: Diagnosis not present

## 2023-01-10 DIAGNOSIS — G8929 Other chronic pain: Secondary | ICD-10-CM

## 2023-01-10 DIAGNOSIS — R29898 Other symptoms and signs involving the musculoskeletal system: Secondary | ICD-10-CM

## 2023-01-10 NOTE — Therapy (Signed)
OUTPATIENT PHYSICAL THERAPY TREATMENT   Patient Name: Terry Camacho MRN: [id number] DOB:[birth year], 47 y.o., female Today's Date: 01/10/2023  END OF SESSION:  PT End of Session - 01/10/23 0719     Visit Number 11    Number of Visits 24    Date for PT Re-Evaluation 02/12/23    Authorization Type [employer]    PT Start Time 0718    PT Stop Time 0756    PT Time Calculation (min) 38 min                   Past Medical History:  Diagnosis Date   GERD (gastroesophageal reflux disease)    Hiatal hernia    Palpitations 11/18/2014   Past Surgical History:  Procedure Laterality Date   ESOPHAGOGASTRODUODENOSCOPY  10/14/2005   esophagitis   L shoulder rotator cuff repair Left 09/25/2022   Patient Active Problem List   Diagnosis Date Noted   Chronic left shoulder pain 12/26/2022   Acute intractable headache 03/15/2022   Chronic neck pain 03/15/2022   Trapezius strain 12/13/2021   MDD (major depressive disorder), recurrent episode, moderate (HCC) 04/02/2019   Family history of thyroid disease 03/19/2018   Class 1 obesity without serious comorbidity with body mass index (BMI) of 31.0 to 31.9 in adult 05/07/2016   History of palpitations 11/18/2014   GERD 04/28/2007    PCP: Excell Seltzer, MD  REFERRING PROVIDER: Teryl Lucy, MD   REFERRING DIAG: s/p L shoulder SAD, RCR and bicep tenodesis  THERAPY DIAG:  Chronic left shoulder pain  Weakness of left shoulder  Rationale for Evaluation and Treatment: Rehabilitation  ONSET DATE: 09/25/2022  SUBJECTIVE:                                                                                                                                                                                      SUBJECTIVE STATEMENT: 01/10/23: The stiffness is better since starting the NSAID but the pain is the same.   01/06/23: Diclofenac prescribed for 2 x per day from primary Dr, started it 1 week ago.  Less stiffness but still pain. Saw   Surgeon on 11/27 who will order MRI in 2-3 weeks if no better. Pain level unchanged.    PERTINENT HISTORY: See hx  PAIN:  Are you having pain? Yes: NPRS scale: 6/10 Pain location: L shoulder, anterior and posterior Pain description: throbbing,  Aggravating factors: anything a sudden pain Relieving factors: heating pad, ibuprofen and resting.   PRECAUTIONS: Shoulder  RED FLAGS: None   WEIGHT BEARING RESTRICTIONS: Yes NWB   FALLS:  Has patient fallen in last 6 months? No  LIVING ENVIRONMENT: Lives with: lives  with their family Lives in: House/apartment Stairs: Yes: Internal: 17 steps; on right going up Has following equipment at home:  sling  OCCUPATION: Facilities manager  PLOF: Independent  PATIENT GOALS: lift arm and get it moving like it should.   NEXT MD VISIT: 12/04/2022  OBJECTIVE:  Note: Objective measures were completed at Evaluation unless otherwise noted.  DIAGNOSTIC FINDINGS:  At MD's office.   PATIENT SURVEYS:  FOTO 12% and predicted 57% 12/18/2022 4%  COGNITION: Overall cognitive status: Within functional limits for tasks assessed     SENSATION: WFL  POSTURE: FWD  UPPER EXTREMITY ROM:   Passive ROM  Right eval Left eval Left 12/13/22 Left  12/16/2022 Left 12/18/22 Left 01/01/23 Left  01/06/23 Left 01/10/23  Shoulder flexion  40 P! AROM 65 PROM 110 AAROM supine 105 AROM 102 supine [id number] ! seated Supine 120 AA Standing: 100 P 130  Shoulder extension          Shoulder abduction  46 P!    AROM 68 ! Seated Standing: 75 SL AROM 90 P 90  Shoulder adduction          Shoulder internal rotation        Reach to L5 P45  Shoulder external rotation  4 P!    12 !seated  Reach back of neck  P45  Elbow flexion  134    140    Elbow extension  2    12    Wrist flexion          Wrist extension          Wrist ulnar deviation          Wrist radial deviation          Wrist pronation          Wrist supination          (Blank rows = not  tested)  Notes: Pain and guarding noted during assessment specifically with shoulder movement and elbow extension/ flexion requiring use of distraction technique of pressing LE calf/ shin together.   01/01/2023 Note: AAROM with dowel rod  in standing  flexion 111 degrees, abduction 75 degrees      UPPER EXTREMITY MMT:  MMT Right eval Left eval  Shoulder flexion    Shoulder extension    Shoulder abduction    Shoulder adduction    Shoulder internal rotation    Shoulder external rotation    Middle trapezius    Lower trapezius    Elbow flexion    Elbow extension    Wrist flexion    Wrist extension    Wrist ulnar deviation    Wrist radial deviation    Wrist pronation    Wrist supination    Grip strength (lbs)    (Blank rows = not tested)  JOINT MOBILITY TESTING:  Gross guarding and stiffness noted mpacting mobility assessment  PALPATION:  TTP along the anteior aspect of the shoulder along the mid/ proximal bicep brachii,  anterior deltoid, middle deltoid, sub-acromial space, as well as the infrapsinatus. Trigger points noted int he L upper trap / levator scapulae.    TREATMENT:  [facility] Adult PT Treatment:                                                DATE: 01/10/23 Therapeutic Exercise: Pulleys  Standing AAROM shoulder flexion and abduction YTB IR and ER x 15 each towel under elbow Supine Dowel: horiz abdct addct, chest press, pullovers,  Sidelying shoulder flexion  Sidelying shoulder abduction Sidelying shoulder ER  Supine protraction  Supine shoulder flexion long lever x 10  Manual Therapy: Passive shoulder flexion, abdct, IR and ER as tolerated     [facility] Adult PT Treatment:                                                DATE: 01/06/23 Therapeutic Exercise: Pulleys  Supine chest press  Supine pullovers  Supine AAROM horiz abdct  addct with dowel  Supine AAROM ER with dowel  Supine isometric ER, IR, Ext - manual resist from therapist, sub max  Supine protraction AROM Supine bicep stretch SL Shoulder abduction with elbow flexed SL shoulder abduction  Standing shoulder AAROM flex with dowel Standing shoulder AAROM abdct with dowel      [facility] Adult PT Treatment:                                                DATE: 01/01/2023 Therapeutic Exercise: Low load long durationg stretch of bicep with towel under elbow to promote extension 2 x 2 min  Quadruped rocking with holding bil UE on end of table. 2 x 10 holding on the 10th rep performing gentle disctraction (utilizing body to pulsate)  Quadruped position rocking laterally 2 x 20 working stiffness/ available motion Quadruped protraction bil UE - pt had trouble performing so modified to standing at the wall which she was able to perform properly with demonstration AAROM wall walks abduction  Towel IR across the back working into end range  Therapeutic Activity: Reviewed ROM and effect it has on her ADLS and progress that has been made thus far with treatment, and importance of continued work/ treatment. Modalities: E-stim for L shoulder x 12 min, 80 - 120 frequency, level 9 MHP in sitting for the L shoulder   [facility] Adult PT Treatment:                                                DATE: 12/30/2022 Therapeutic Exercise: Pulleys flexion/scaption 2:30 sec L upper trap stretch 2 x 30 sec R sidelying L AAROM flexion using UE ranger on floor 2 x 12 R sidelying L AROM short lever abduction/ scaption 1 x 10 (combined with upward rotation scapular assist) Low load long duration stretch with elbows propped on table leaning forward and elbow flexed 2 x 3 min R sidelying L ER 2 x 10    Updated HEP for low load long duration stretch flexion stretch Manual Therapy: MTPR along the teres minor, and upper trap on the L PROM working into end range of  motion flexion with  oscillations ot reduce pain  GHJ mobs inferior grade III with elbow resting on table at 90 degrees Scapular mobs grade IV focus on upward/ downward rotation.   Modalities: MHP in sitting x 10 min L UE.    [facility] Adult PT Treatment:                                                DATE: 12/23/2022 Therapeutic Exercise: Pulley flexion 2:30 sec, Scaption 2:30 Rolling black physioball up the wall using both arms 2 x 5  Dowel rod IR 2 x 10  Standing isometric shoulder press 1 x 10 holding 5 seconds Rows 2 x 10 with YTB Shoulder Rows/ IR/ER YTB 2 x 10 with towel under arm   Updated HEP for gentle dowel IR Manual Therapy: MTPR along the infraspinatus x 3    PATIENT EDUCATION: Education details: Evaluation findings, POC, goals, HEP with proper form/ rationale.  Person educated: Patient Education method: Explanation, Verbal cues, and Handouts Education comprehension: verbalized understanding  HOME EXERCISE PROGRAM: Access Code: [email or web address] URL: [email or web address] Date: 12/30/2022 Prepared by: Lulu Riding  Exercises - Seated Upper Trapezius Stretch  - 1 x daily - 7 x weekly - 2 sets - 2 reps - 30 seconds hold - Gentle Levator Scapulae Stretch  - 1 x daily - 7 x weekly - 2 sets - 2 reps - 30 seconds hold - Shoulder Table Slides Flexion  - 2 x daily - 3 sets - 10 reps - 5 hold - Shoulder table slides ABD  - 2 x daily - 3 sets - 10 reps - 5 hold - Seated Gripping Towel  - 1 x daily - 7 x weekly - 2 sets - 10 reps - Supine Elbow Flexion Extension AROM  - 1 x daily - 7 x weekly - 2 sets - 10 reps - Standing Shoulder External Rotation AAROM with Dowel  - 1 x daily - 7 x weekly - 2 sets - 10 reps - Shoulder Flexion Overhead with Dowel  - 1 x daily - 7 x weekly - 2 sets - 10 reps - Standing Shoulder Abduction AAROM with Dowel  - 1 x daily - 7 x weekly - 2 sets - 10 reps - Standing Isometric Shoulder Internal Rotation at Doorway (Mirrored)  - 1 x daily - 7 x weekly - 2 sets  - 10 reps - 10 seconds hold - Standing Isometric Shoulder Flexion with Doorway - Arm Bent  - 1 x daily - 7 x weekly - 2 sets - 10 reps - 10 seconds hold - Standing Isometric Shoulder External Rotation with Doorway and Towel Roll  - 1 x daily - 7 x weekly - 2 sets - 10 reps - 10 seconds hold - Standing Isometric Shoulder Extension with Doorway - Arm Bent  - 1 x daily - 7 x weekly - 2 sets - 10 reps - seconds hold - Standing Isometric Shoulder Abduction with Doorway - Arm Bent  - 1 x daily - 7 x weekly - 2 sets - 10 reps - 10 seconds hold - Seated Shoulder External Rotation PROM on Table  - 3 x daily - 7 x weekly - 2 sets - 10 reps - 30-60 sec hold - Standing Bilateral Shoulder Internal Rotation AAROM with Dowel  - 1 x daily - 7 x weekly -  2 sets - 10 reps - 5 sec hold - Prone Chest Stretch on Chair  - 1 x daily - 7 x weekly - 1 sets - 3-5 reps - 2 min hold  ASSESSMENT:  CLINICAL IMPRESSION: 01/10/2023 Terry Camacho reports less stiffness since starting NSAID however no change in pain level (6/10). Continued with shoulder ROM and strengthening which pt tolerated well today. No complaints with supine long lever shoulder flexion AROM. Pt still plans to contact MD regarding continued pain and hopes to be scheduled for an MRI.    Assessment at Evaluation: Patient is a 47 y.o. F who was seen today for physical therapy evaluation and treatment for L shoulder s/p RCR, Bicep Tenodesis and SAD on 09/26/22. She is 8 weeks post op today and saw her MD on 10/2 releasing her from her sling. She has limited PROM in all planes secondary to pain and guarding, MMT was withheld due to precautions. Discussed letting her arm hang down vs keeping  her elbow flexed at her side. Provided initial HEP  and pt noted soreness had reduced compared to when she arrived. She would benefit from physical therapy to reduce L shoulder pain, increase ROM and strength, improve LUE strength and overall function by addressing the deficits listed.     OBJECTIVE IMPAIRMENTS: decreased activity tolerance, decreased endurance, decreased ROM, decreased strength, increased edema, increased fascial restrictions, increased muscle spasms, impaired flexibility, impaired UE functional use, improper body mechanics, postural dysfunction, and pain.   ACTIVITY LIMITATIONS: carrying, lifting, and reach over head  PARTICIPATION LIMITATIONS: cleaning, laundry, community activity, occupation, and Exercise  PERSONAL FACTORS: Past/current experiences and Time since onset of injury/illness/exacerbation are also affecting patient's functional outcome.   REHAB POTENTIAL: Excellent  CLINICAL DECISION MAKING: Stable/uncomplicated  EVALUATION COMPLEXITY: Low   GOALS: Goals reviewed with patient? Yes  SHORT TERM GOALS: Target date: 01/01/2023  Pt to be IND with initial HEP for therapeutic progression Baseline: Goal status: MET 01/01/2023  2.  Increase L shoulder AAROM Flexion/ abduction to >/= 120 degrees with </= 5/10 pain for progression of ROM Baseline:  Goal status: Ongoing 01/01/2023  3.  Improve FOTO score to >/= 30% to demonstrate improving function Baseline: 12% 12/18/22: 4% Goal status: Ongoing 01/01/2023  4.  Pt to demonstrate LUE strength to >/= 3/5 to demonstrate progressing strength and function Baseline:  Status: unable to assess accurately due to pain/ guarding Goal status: Ongoing 01/01/2023  LONG TERM GOALS: Target date: 02/12/2023   Pt have functional L shoulder AROM in all planes compared bil with no report of pain or limitatoins Baseline:  Goal status: INITIAL  2.  Pt to be a lift and lower >/= 10# to and from a overhead shelf with </= 2/10 pain or functional strength required for Adls Baseline:  Goal status: INITIAL  3.  Pt to be able to lift/ lower >/= 20# to / from waist to floor for functional lifting and strengthening Baseline:  Goal status: INITIAL  4.  Pt be able to push/ pull >/= 20# with no report of pain  or restrictions and be able to return to personal workout routine with gradual progression without pain Baseline:  Goal status: INITIAL  5.  Pt to increase FOTO score to >/= 57% to demonstrate improvement in function Baseline:  Goal status: INITIAL  6.  Pt to be IND with all HEP and able to maintain and progress current LOF IND  Baseline:  Goal status: INITIAL  PLAN:  PT FREQUENCY: 1-2x/week  PT DURATION:  12 weeks  PLANNED INTERVENTIONS: 97164- PT Re-evaluation, 97110-Therapeutic exercises, 97530- Therapeutic activity, [id number]- Neuromuscular re-education, 97535- Self Care, 13086- Manual therapy, [id number]- Aquatic Therapy, 97014- Electrical stimulation (unattended), 97016- Vasopneumatic device, Taping, Dry Needling, Joint mobilization, Scar mobilization, Cryotherapy, and Moist heat  PLAN FOR NEXT SESSION: review/ update HEP PRN. 8 weeks out at eval, shoulder mobs, PROM/ AAROM, STW in the shoulder/ bicep, continue gentile isometrics.  Jannette Spanner, PTA 01/10/23 8:10 AM Phone: [phone] Fax: [phone]

## 2023-01-16 ENCOUNTER — Ambulatory Visit: Payer: Managed Care, Other (non HMO) | Admitting: Physical Therapy

## 2023-01-16 ENCOUNTER — Encounter: Payer: Self-pay | Admitting: Physical Therapy

## 2023-01-16 DIAGNOSIS — R29898 Other symptoms and signs involving the musculoskeletal system: Secondary | ICD-10-CM

## 2023-01-16 DIAGNOSIS — G8929 Other chronic pain: Secondary | ICD-10-CM

## 2023-01-16 DIAGNOSIS — M25512 Pain in left shoulder: Secondary | ICD-10-CM | POA: Diagnosis not present

## 2023-01-16 NOTE — Therapy (Signed)
OUTPATIENT PHYSICAL THERAPY TREATMENT   Patient Name: Kattaleya Liner MRN: [id number] DOB:01-18-1976, 47 y.o., female Today's Date: 01/16/2023  END OF SESSION:  PT End of Session - 01/16/23 0803     Visit Number 12    Number of Visits 24    Date for PT Re-Evaluation 02/12/23    Authorization Type [employer]    PT Start Time 0800    PT Stop Time 0840    PT Time Calculation (min) 40 min                    Past Medical History:  Diagnosis Date   GERD (gastroesophageal reflux disease)    Hiatal hernia    Palpitations 11/18/2014   Past Surgical History:  Procedure Laterality Date   ESOPHAGOGASTRODUODENOSCOPY  10/14/2005   esophagitis   L shoulder rotator cuff repair Left 09/25/2022   Patient Active Problem List   Diagnosis Date Noted   Chronic left shoulder pain 12/26/2022   Acute intractable headache 03/15/2022   Chronic neck pain 03/15/2022   Trapezius strain 12/13/2021   MDD (major depressive disorder), recurrent episode, moderate (HCC) 04/02/2019   Family history of thyroid disease 03/19/2018   Class 1 obesity without serious comorbidity with body mass index (BMI) of 31.0 to 31.9 in adult 05/07/2016   History of palpitations 11/18/2014   GERD 04/28/2007    PCP: Excell Seltzer, MD  REFERRING PROVIDER: Teryl Lucy, MD   REFERRING DIAG: s/p L shoulder SAD, RCR and bicep tenodesis  THERAPY DIAG:  Chronic left shoulder pain  Weakness of left shoulder  Rationale for Evaluation and Treatment: Rehabilitation  ONSET DATE: 09/25/2022  SUBJECTIVE:                                                                                                                                                                                      SUBJECTIVE STATEMENT: 01/16/23 : I will have an MRI arthrogram on Dec 18th, Follow up Dec 30th. 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   01/10/23: The stiffness is better since starting the NSAID but the pain is  the same.   01/06/23: Diclofenac prescribed for 2 x per day from primary Dr, started it 1 week ago.  Less stiffness but still pain. Saw  Surgeon on 11/27 who will order MRI in 2-3 weeks if no better. Pain level unchanged.    PERTINENT HISTORY: See hx  PAIN:  Are you having pain? Yes: NPRS scale: 6/10 Pain location: L shoulder, anterior and posterior Pain description: throbbing,  Aggravating factors: anything a sudden pain Relieving factors: heating pad, ibuprofen and resting.   PRECAUTIONS: Shoulder  RED FLAGS: None   WEIGHT BEARING RESTRICTIONS: Yes NWB   FALLS:  Has patient fallen in last 6 months? No  LIVING ENVIRONMENT: Lives with: lives with their family Lives in: House/apartment Stairs: Yes: Internal: 17 steps; on right going up Has following equipment at home:  sling  OCCUPATION: Facilities manager  PLOF: Independent  PATIENT GOALS: lift arm and get it moving like it should.   NEXT MD VISIT: 12/04/2022  OBJECTIVE:  Note: Objective measures were completed at Evaluation unless otherwise noted.  DIAGNOSTIC FINDINGS:  At MD's office.   PATIENT SURVEYS:  FOTO 12% and predicted 57% 12/18/2022 4% 01/16/23: 45%  COGNITION: Overall cognitive status: Within functional limits for tasks assessed     SENSATION: WFL  POSTURE: FWD  UPPER EXTREMITY ROM:   Passive ROM  Right eval Left eval Left 12/13/22 Left  12/16/2022 Left 12/18/22 Left 01/01/23 Left  01/06/23 Left 01/10/23 Left 01/16/23  Shoulder flexion  40 P! AROM 65 PROM 110 AAROM supine 105 AROM 102 supine AROM92 ! seated Supine 120 AA Standing: 100 P 130 120A standing   Shoulder extension           Shoulder abduction  46 P!    AROM 68 ! Seated Standing: 75 SL AROM 90 P 90 105  A Standing   Shoulder adduction           Shoulder internal rotation        Reach to L5 P45  Reach to L5  Shoulder external rotation  4 P!    12 !seated  Reach back of neck  P45 Reach back of neck   Elbow flexion   134    140     Elbow extension  2    12     Wrist flexion           Wrist extension           Wrist ulnar deviation           Wrist radial deviation           Wrist pronation           Wrist supination           (Blank rows = not tested)  Notes: Pain and guarding noted during assessment specifically with shoulder movement and elbow extension/ flexion requiring use of distraction technique of pressing LE calf/ shin together.   01/01/2023 Note: AAROM with dowel rod  in standing  flexion 111 degrees, abduction 75 degrees      UPPER EXTREMITY MMT:  MMT Right eval Left eval  Shoulder flexion    Shoulder extension    Shoulder abduction    Shoulder adduction    Shoulder internal rotation    Shoulder external rotation    Middle trapezius    Lower trapezius    Elbow flexion    Elbow extension    Wrist flexion    Wrist extension    Wrist ulnar deviation    Wrist radial deviation    Wrist pronation    Wrist supination    Grip strength (lbs)    (Blank rows = not tested)  JOINT MOBILITY TESTING:  Gross guarding and stiffness noted mpacting mobility assessment  PALPATION:  TTP along the anteior aspect of the shoulder along the mid/ proximal bicep brachii,  anterior deltoid, middle deltoid, sub-acromial space, as well as the infrapsinatus. Trigger points noted int he L upper trap / levator scapulae.    TREATMENT:  [facility] Adult PT Treatment:                                                DATE: 01/16/23 Therapeutic Exercise: Pulleys x 20 min flex, 2 min scaption UBE 2 min each @ level 1.5 Cabinet reach bottom shelf L AROM x 10 (shoulder height)  Cabinet reach into scaption L AROM x 10 (shoulder height)  Standing Red band Shoulder ER/IR x 15 each  Standing red band protraction /short lever flexion x 10  Green band Row x 15 Green band shoulder ext  bilat  x15 Standing shoulder flexion wall slides  approx 130 deg x 10 Standing shoulder scaption wall slides  x 10 Bilat bicep curl holding 6# total x 10, 8# x 10  Standing doorway stretch for L ER single arm, low 30 sec x 3 Standing IR towel wash lower back x 10     [facility] Adult PT Treatment:                                                DATE: 01/10/23 Therapeutic Exercise: Pulleys  Standing AAROM shoulder flexion and abduction YTB IR and ER x 15 each towel under elbow Supine Dowel: horiz abdct addct, chest press, pullovers,  Sidelying shoulder flexion  Sidelying shoulder abduction Sidelying shoulder ER  Supine protraction  Supine shoulder flexion long lever x 10  Manual Therapy: Passive shoulder flexion, abdct, IR and ER as tolerated     [facility] Adult PT Treatment:                                                DATE: 01/06/23 Therapeutic Exercise: Pulleys  Supine chest press  Supine pullovers  Supine AAROM horiz abdct addct with dowel  Supine AAROM ER with dowel  Supine isometric ER, IR, Ext - manual resist from therapist, sub max  Supine protraction AROM Supine bicep stretch SL Shoulder abduction with elbow flexed SL shoulder abduction  Standing shoulder AAROM flex with dowel Standing shoulder AAROM abdct with dowel      [facility] Adult PT Treatment:                                                DATE: 01/01/2023 Therapeutic Exercise: Low load long durationg stretch of bicep with towel under elbow to promote extension 2 x 2 min  Quadruped rocking with holding bil UE on end of table. 2 x 10 holding on the 10th rep performing gentle disctraction (utilizing body to pulsate)  Quadruped position rocking laterally 2 x 20 working stiffness/ available motion Quadruped protraction bil UE - pt had trouble performing so modified to standing at the wall which she was able to perform properly with demonstration AAROM wall walks abduction  Towel IR across the back working into end  range  Therapeutic Activity: Reviewed ROM and effect it has on her ADLS and progress that has been made thus far with treatment, and importance of continued  work/ treatment. Modalities: E-stim for L shoulder x 12 min, 80 - 120 frequency, level 9 MHP in sitting for the L shoulder   [facility] Adult PT Treatment:                                                DATE: 12/30/2022 Therapeutic Exercise: Pulleys flexion/scaption 2:30 sec L upper trap stretch 2 x 30 sec R sidelying L AAROM flexion using UE ranger on floor 2 x 12 R sidelying L AROM short lever abduction/ scaption 1 x 10 (combined with upward rotation scapular assist) Low load long duration stretch with elbows propped on table leaning forward and elbow flexed 2 x 3 min R sidelying L ER 2 x 10    Updated HEP for low load long duration stretch flexion stretch Manual Therapy: MTPR along the teres minor, and upper trap on the L PROM working into end range of motion flexion with oscillations ot reduce pain  GHJ mobs inferior grade III with elbow resting on table at 90 degrees Scapular mobs grade IV focus on upward/ downward rotation.   Modalities: MHP in sitting x 10 min L UE.        PATIENT EDUCATION: Education details: Evaluation findings, POC, goals, HEP with proper form/ rationale.  Person educated: Patient Education method: Explanation, Verbal cues, and Handouts Education comprehension: verbalized understanding  HOME EXERCISE PROGRAM: Access Code: [email or web address] URL: [email or web address] Date: 12/30/2022 Prepared by: Lulu Riding  Exercises - Seated Upper Trapezius Stretch  - 1 x daily - 7 x weekly - 2 sets - 2 reps - 30 seconds hold - Gentle Levator Scapulae Stretch  - 1 x daily - 7 x weekly - 2 sets - 2 reps - 30 seconds hold - Shoulder Table Slides Flexion  - 2 x daily - 3 sets - 10 reps - 5 hold - Shoulder table slides ABD  - 2 x daily - 3 sets - 10 reps - 5 hold - Seated Gripping Towel  - 1 x daily  - 7 x weekly - 2 sets - 10 reps - Supine Elbow Flexion Extension AROM  - 1 x daily - 7 x weekly - 2 sets - 10 reps - Standing Shoulder External Rotation AAROM with Dowel  - 1 x daily - 7 x weekly - 2 sets - 10 reps - Shoulder Flexion Overhead with Dowel  - 1 x daily - 7 x weekly - 2 sets - 10 reps - Standing Shoulder Abduction AAROM with Dowel  - 1 x daily - 7 x weekly - 2 sets - 10 reps - Standing Isometric Shoulder Internal Rotation at Doorway (Mirrored)  - 1 x daily - 7 x weekly - 2 sets - 10 reps - 10 seconds hold - Standing Isometric Shoulder Flexion with Doorway - Arm Bent  - 1 x daily - 7 x weekly - 2 sets - 10 reps - 10 seconds hold - Standing Isometric Shoulder External Rotation with Doorway and Towel Roll  - 1 x daily - 7 x weekly - 2 sets - 10 reps - 10 seconds hold - Standing Isometric Shoulder Extension with Doorway - Arm Bent  - 1 x daily - 7 x weekly - 2 sets - 10 reps - seconds hold - Standing Isometric Shoulder Abduction with Doorway - Arm Bent  - 1 x daily - 7 x weekly -  2 sets - 10 reps - 10 seconds hold - Seated Shoulder External Rotation PROM on Table  - 3 x daily - 7 x weekly - 2 sets - 10 reps - 30-60 sec hold - Standing Bilateral Shoulder Internal Rotation AAROM with Dowel  - 1 x daily - 7 x weekly - 2 sets - 10 reps - 5 sec hold - Prone Chest Stretch on Chair  - 1 x daily - 7 x weekly - 1 sets - 3-5 reps - 2 min hold  ASSESSMENT:  CLINICAL IMPRESSION: 01/16/2023 Syanne reports no change since last session. Has contacted MD and will have MRI arthrogram on Dec 18 th due to continued pain.  She does show improvement in Shoulder flexion and abduction AROM compared to previous sessions and improved FOTO score. Initiated cabinet reaching today with good tolerance. Her RTW date is set for Jan 2. Will plan to work toward LTGs over next 2 sessions and determine future POC based on MRI and MD follow up Dec 30th.   Assessment at Evaluation: Patient is a 47 y.o. F who was seen today  for physical therapy evaluation and treatment for L shoulder s/p RCR, Bicep Tenodesis and SAD on 09/26/22. She is 8 weeks post op today and saw her MD on 10/2 releasing her from her sling. She has limited PROM in all planes secondary to pain and guarding, MMT was withheld due to precautions. Discussed letting her arm hang down vs keeping  her elbow flexed at her side. Provided initial HEP  and pt noted soreness had reduced compared to when she arrived. She would benefit from physical therapy to reduce L shoulder pain, increase ROM and strength, improve LUE strength and overall function by addressing the deficits listed.    OBJECTIVE IMPAIRMENTS: decreased activity tolerance, decreased endurance, decreased ROM, decreased strength, increased edema, increased fascial restrictions, increased muscle spasms, impaired flexibility, impaired UE functional use, improper body mechanics, postural dysfunction, and pain.   ACTIVITY LIMITATIONS: carrying, lifting, and reach over head  PARTICIPATION LIMITATIONS: cleaning, laundry, community activity, occupation, and Exercise  PERSONAL FACTORS: Past/current experiences and Time since onset of injury/illness/exacerbation are also affecting patient's functional outcome.   REHAB POTENTIAL: Excellent  CLINICAL DECISION MAKING: Stable/uncomplicated  EVALUATION COMPLEXITY: Low   GOALS: Goals reviewed with patient? Yes  SHORT TERM GOALS: Target date: 01/01/2023  Pt to be IND with initial HEP for therapeutic progression Baseline: Goal status: MET 01/01/2023  2.  Increase L shoulder AAROM Flexion/ abduction to >/= 120 degrees with </= 5/10 pain for progression of ROM Baseline:  01/16/23: ROM met however pain 6/10 Goal status: PARTIALLY MET 01/16/23  3.  Improve FOTO score to >/= 30% to demonstrate improving function Baseline: 12% 12/18/22: 4% 01/16/23: 45%  Goal status: Ongoing   4.  Pt to demonstrate LUE strength to >/= 3/5 to demonstrate progressing  strength and function Baseline:  Status: unable to assess accurately due to pain/ guarding Goal status: Ongoing 01/01/2023  LONG TERM GOALS: Target date: 02/12/2023   Pt have functional L shoulder AROM in all planes compared bil with no report of pain or limitatoins Baseline:  01/16/23: see chart Goal status: ONGOING  2.  Pt to be a lift and lower >/= 10# to and from a overhead shelf with </= 2/10 pain or functional strength required for Adls Baseline:  01/16/23: began shoulder height reaching Goal status: ONGOING  3.  Pt to be able to lift/ lower >/= 20# to / from waist to floor for  functional lifting and strengthening Baseline:  Goal status: ONGOING  4.  Pt be able to push/ pull >/= 20# with no report of pain or restrictions and be able to return to personal workout routine with gradual progression without pain Baseline:  01/16/23: able to push and pull light therabands Goal status: ONGOING  5.  Pt to increase FOTO score to >/= 57% to demonstrate improvement in function Baseline:  01/16/23: 45%  Goal status: ONGOING  6.  Pt to be IND with all HEP and able to maintain and progress current LOF IND  Baseline:  Goal status: ONGOING  PLAN:  PT FREQUENCY: 1-2x/week  PT DURATION: 12 weeks  PLANNED INTERVENTIONS: 97164- PT Re-evaluation, 97110-Therapeutic exercises, 97530- Therapeutic activity, [id number]- Neuromuscular re-education, 97535- Self Care, 16109- Manual therapy, [id number]- Aquatic Therapy, 97014- Electrical stimulation (unattended), 97016- Vasopneumatic device, Taping, Dry Needling, Joint mobilization, Scar mobilization, Cryotherapy, and Moist heat  PLAN FOR NEXT SESSION: review/ update HEP PRN. 8 weeks out at eval, shoulder mobs, PROM/ AAROM, STW in the shoulder/ bicep, continue gentile isometrics.  Jannette Spanner, PTA 01/16/23 11:37 AM Phone: [phone] Fax: [phone]

## 2023-01-21 ENCOUNTER — Ambulatory Visit: Payer: Managed Care, Other (non HMO) | Admitting: Physical Therapy

## 2023-01-21 ENCOUNTER — Encounter: Payer: Self-pay | Admitting: Physical Therapy

## 2023-01-21 DIAGNOSIS — M25512 Pain in left shoulder: Secondary | ICD-10-CM | POA: Diagnosis not present

## 2023-01-21 DIAGNOSIS — R29898 Other symptoms and signs involving the musculoskeletal system: Secondary | ICD-10-CM

## 2023-01-21 DIAGNOSIS — G8929 Other chronic pain: Secondary | ICD-10-CM

## 2023-01-21 NOTE — Therapy (Signed)
OUTPATIENT PHYSICAL THERAPY TREATMENT   Patient Name: Terry Camacho MRN: [id number] DOB:1975-11-12, 47 y.o., female Today's Date: 01/21/2023  END OF SESSION:  PT End of Session - 01/21/23 0935     Visit Number 13    Number of Visits 24    Date for PT Re-Evaluation 02/12/23    Authorization Type [employer]    PT Start Time 0936    PT Stop Time 1016    PT Time Calculation (min) 40 min    Activity Tolerance Patient tolerated treatment well;Patient limited by pain    Behavior During Therapy [facility] for tasks assessed/performed                     Past Medical History:  Diagnosis Date   GERD (gastroesophageal reflux disease)    Hiatal hernia    Palpitations 11/18/2014   Past Surgical History:  Procedure Laterality Date   ESOPHAGOGASTRODUODENOSCOPY  10/14/2005   esophagitis   L shoulder rotator cuff repair Left 09/25/2022   Patient Active Problem List   Diagnosis Date Noted   Chronic left shoulder pain 12/26/2022   Acute intractable headache 03/15/2022   Chronic neck pain 03/15/2022   Trapezius strain 12/13/2021   MDD (major depressive disorder), recurrent episode, moderate (HCC) 04/02/2019   Family history of thyroid disease 03/19/2018   Class 1 obesity without serious comorbidity with body mass index (BMI) of 31.0 to 31.9 in adult 05/07/2016   History of palpitations 11/18/2014   GERD 04/28/2007    PCP: Excell Seltzer, MD  REFERRING PROVIDER: Teryl Lucy, MD   REFERRING DIAG: s/p L shoulder SAD, RCR and bicep tenodesis  THERAPY DIAG:  Chronic left shoulder pain  Weakness of left shoulder  Rationale for Evaluation and Treatment: Rehabilitation  ONSET DATE: 09/25/2022  SUBJECTIVE:                                                                                                                                                                                      SUBJECTIVE STATEMENT: "I have MRI/ Arthrogram tomorrow, the physican is a little concerned  which is what prompted the need for more imaging."   PERTINENT HISTORY: See hx  PAIN:  Are you having pain? Yes: NPRS scale: 6/10 Pain location: L shoulder, anterior and posterior Pain description: throbbing,  Aggravating factors: anything a sudden pain Relieving factors: heating pad, ibuprofen and resting.   PRECAUTIONS: Shoulder  RED FLAGS: None   WEIGHT BEARING RESTRICTIONS: Yes NWB   FALLS:  Has patient fallen in last 6 months? No  LIVING ENVIRONMENT: Lives with: lives with their family Lives in: House/apartment Stairs: Yes: Internal: 17 steps; on right going  up Has following equipment at home:  sling  OCCUPATION: Facilities manager  PLOF: Independent  PATIENT GOALS: lift arm and get it moving like it should.   NEXT MD VISIT: 12/04/2022  OBJECTIVE:  Note: Objective measures were completed at Evaluation unless otherwise noted.  DIAGNOSTIC FINDINGS:  At MD's office.   PATIENT SURVEYS:  FOTO 12% and predicted 57% 12/18/2022 4% 01/16/23: 45%  COGNITION: Overall cognitive status: Within functional limits for tasks assessed     SENSATION: WFL  POSTURE: FWD  UPPER EXTREMITY ROM:   Passive ROM  Right eval Left eval Left 12/13/22 Left  12/16/2022 Left 12/18/22 Left 01/01/23 Left  01/06/23 Left 01/10/23 Left 01/16/23  Shoulder flexion  40 P! AROM 65 PROM 110 AAROM supine 105 AROM 102 supine [id number] ! seated Supine 120 AA Standing: 100 P 130 120A standing   Shoulder extension           Shoulder abduction  46 P!    AROM 68 ! Seated Standing: 75 SL AROM 90 P 90 105  A Standing   Shoulder adduction           Shoulder internal rotation        Reach to L5 P45  Reach to L5  Shoulder external rotation  4 P!    12 !seated  Reach back of neck  P45 Reach back of neck   Elbow flexion  134    140     Elbow extension  2    12     Wrist flexion           Wrist extension           Wrist ulnar deviation           Wrist radial deviation           Wrist  pronation           Wrist supination           (Blank rows = not tested)  Notes: Pain and guarding noted during assessment specifically with shoulder movement and elbow extension/ flexion requiring use of distraction technique of pressing LE calf/ shin together.   01/01/2023 Note: AAROM with dowel rod  in standing  flexion 111 degrees, abduction 75 degrees      UPPER EXTREMITY MMT:  MMT Right eval Left eval  Shoulder flexion    Shoulder extension    Shoulder abduction    Shoulder adduction    Shoulder internal rotation    Shoulder external rotation    Middle trapezius    Lower trapezius    Elbow flexion    Elbow extension    Wrist flexion    Wrist extension    Wrist ulnar deviation    Wrist radial deviation    Wrist pronation    Wrist supination    Grip strength (lbs)    (Blank rows = not tested)  JOINT MOBILITY TESTING:  Gross guarding and stiffness noted mpacting mobility assessment  PALPATION:  TTP along the anteior aspect of the shoulder along the mid/ proximal bicep brachii,  anterior deltoid, middle deltoid, sub-acromial space, as well as the infrapsinatus. Trigger points noted int he L upper trap / levator scapulae.    TREATMENT:  [facility] Adult PT Treatment:                                                DATE: 01/21/2023 Therapeutic Exercise: UBE L3 x 5 (FWD/BWD 2:30 ea) Upper trap/ levator scap stretch on the L 1 x 30 sec UE ranger flexion/ abduction and IR 2 x 12 Standing shoulder PNF D2 1 x 6  Self Care: Continued to review pain eduation hurt vs harm  [facility] Adult PT Treatment:                                                DATE: 01/16/23 Therapeutic Exercise: Pulleys x 20 min flex, 2 min scaption UBE 2 min each @ level 1.5 Cabinet reach bottom shelf L AROM x 10 (shoulder height)  Cabinet reach into scaption L AROM x 10  (shoulder height)  Standing Red band Shoulder ER/IR x 15 each  Standing red band protraction /short lever flexion x 10  Green band Row x 15 Green band shoulder ext bilat  x15 Standing shoulder flexion wall slides  approx 130 deg x 10 Standing shoulder scaption wall slides  x 10 Bilat bicep curl holding 6# total x 10, 8# x 10  Standing doorway stretch for L ER single arm, low 30 sec x 3 Standing IR towel wash lower back x 10   [facility] Adult PT Treatment:                                                DATE: 01/10/23 Therapeutic Exercise: Pulleys  Standing AAROM shoulder flexion and abduction YTB IR and ER x 15 each towel under elbow Supine Dowel: horiz abdct addct, chest press, pullovers,  Sidelying shoulder flexion  Sidelying shoulder abduction Sidelying shoulder ER  Supine protraction  Supine shoulder flexion long lever x 10  Manual Therapy: Passive shoulder flexion, abdct, IR and ER as tolerated        PATIENT EDUCATION: Education details: Evaluation findings, POC, goals, HEP with proper form/ rationale.  Person educated: Patient Education method: Explanation, Verbal cues, and Handouts Education comprehension: verbalized understanding  HOME EXERCISE PROGRAM: Access Code: [email or web address] URL: [email or web address] Date: 12/30/2022 Prepared by: Lulu Riding  Exercises - Seated Upper Trapezius Stretch  - 1 x daily - 7 x weekly - 2 sets - 2 reps - 30 seconds hold - Gentle Levator Scapulae Stretch  - 1 x daily - 7 x weekly - 2 sets - 2 reps - 30 seconds hold - Shoulder Table Slides Flexion  - 2 x daily - 3 sets - 10 reps - 5 hold - Shoulder table slides ABD  - 2 x daily - 3 sets - 10 reps - 5 hold - Seated Gripping Towel  - 1 x daily - 7 x weekly - 2 sets - 10 reps - Supine Elbow Flexion Extension AROM  - 1 x daily - 7 x weekly - 2 sets - 10 reps - Standing Shoulder External Rotation AAROM with Dowel  - 1 x daily - 7 x weekly - 2 sets - 10 reps - Shoulder  Flexion  Overhead with Dowel  - 1 x daily - 7 x weekly - 2 sets - 10 reps - Standing Shoulder Abduction AAROM with Dowel  - 1 x daily - 7 x weekly - 2 sets - 10 reps - Standing Isometric Shoulder Internal Rotation at Doorway (Mirrored)  - 1 x daily - 7 x weekly - 2 sets - 10 reps - 10 seconds hold - Standing Isometric Shoulder Flexion with Doorway - Arm Bent  - 1 x daily - 7 x weekly - 2 sets - 10 reps - 10 seconds hold - Standing Isometric Shoulder External Rotation with Doorway and Towel Roll  - 1 x daily - 7 x weekly - 2 sets - 10 reps - 10 seconds hold - Standing Isometric Shoulder Extension with Doorway - Arm Bent  - 1 x daily - 7 x weekly - 2 sets - 10 reps - seconds hold - Standing Isometric Shoulder Abduction with Doorway - Arm Bent  - 1 x daily - 7 x weekly - 2 sets - 10 reps - 10 seconds hold - Seated Shoulder External Rotation PROM on Table  - 3 x daily - 7 x weekly - 2 sets - 10 reps - 30-60 sec hold - Standing Bilateral Shoulder Internal Rotation AAROM with Dowel  - 1 x daily - 7 x weekly - 2 sets - 10 reps - 5 sec hold - Prone Chest Stretch on Chair  - 1 x daily - 7 x weekly - 1 sets - 3-5 reps - 2 min hold  ASSESSMENT:  CLINICAL IMPRESSION: 01/21/2023 Tanajah arrives to session today noting continued pain at 6/10, and she plans to have her MRI tomorrow. She will also be 17 weeks post op tomorrow. Continued working [employer] today with UE range which during session measured 140 degrees of flexion / Scaption which she did required intermittent verbal cues to maintain form.  Overall she did very well with session today noting she felt pretty good and noted feeling less pain with the activities today.   Assessment at Evaluation: Patient is a 47 y.o. F who was seen today for physical therapy evaluation and treatment for L shoulder s/p RCR, Bicep Tenodesis and SAD on 09/26/22. She is 8 weeks post op today and saw her MD on 10/2 releasing her from her sling. She has limited PROM in all planes  secondary to pain and guarding, MMT was withheld due to precautions. Discussed letting her arm hang down vs keeping  her elbow flexed at her side. Provided initial HEP  and pt noted soreness had reduced compared to when she arrived. She would benefit from physical therapy to reduce L shoulder pain, increase ROM and strength, improve LUE strength and overall function by addressing the deficits listed.    OBJECTIVE IMPAIRMENTS: decreased activity tolerance, decreased endurance, decreased ROM, decreased strength, increased edema, increased fascial restrictions, increased muscle spasms, impaired flexibility, impaired UE functional use, improper body mechanics, postural dysfunction, and pain.   ACTIVITY LIMITATIONS: carrying, lifting, and reach over head  PARTICIPATION LIMITATIONS: cleaning, laundry, community activity, occupation, and Exercise  PERSONAL FACTORS: Past/current experiences and Time since onset of injury/illness/exacerbation are also affecting patient's functional outcome.   REHAB POTENTIAL: Excellent  CLINICAL DECISION MAKING: Stable/uncomplicated  EVALUATION COMPLEXITY: Low   GOALS: Goals reviewed with patient? Yes  SHORT TERM GOALS: Target date: 01/01/2023  Pt to be IND with initial HEP for therapeutic progression Baseline: Goal status: MET 01/01/2023  2.  Increase L shoulder AAROM Flexion/ abduction to >/=  120 degrees with </= 5/10 pain for progression of ROM Baseline:  01/16/23: ROM met however pain 6/10 Goal status: PARTIALLY MET 01/16/23  3.  Improve FOTO score to >/= 30% to demonstrate improving function Baseline: 12% 12/18/22: 4% 01/16/23: 45%  Goal status: Ongoing   4.  Pt to demonstrate LUE strength to >/= 3/5 to demonstrate progressing strength and function Baseline:  Status: unable to assess accurately due to pain/ guarding Goal status: Ongoing 01/01/2023  LONG TERM GOALS: Target date: 02/12/2023   Pt have functional L shoulder AROM in all planes  compared bil with no report of pain or limitatoins Baseline:  01/16/23: see chart Goal status: ONGOING  2.  Pt to be a lift and lower >/= 10# to and from a overhead shelf with </= 2/10 pain or functional strength required for Adls Baseline:  01/16/23: began shoulder height reaching Goal status: ONGOING  3.  Pt to be able to lift/ lower >/= 20# to / from waist to floor for functional lifting and strengthening Baseline:  Goal status: ONGOING  4.  Pt be able to push/ pull >/= 20# with no report of pain or restrictions and be able to return to personal workout routine with gradual progression without pain Baseline:  01/16/23: able to push and pull light therabands Goal status: ONGOING  5.  Pt to increase FOTO score to >/= 57% to demonstrate improvement in function Baseline:  01/16/23: 45%  Goal status: ONGOING  6.  Pt to be IND with all HEP and able to maintain and progress current LOF IND  Baseline:  Goal status: ONGOING  PLAN:  PT FREQUENCY: 1-2x/week  PT DURATION: 12 weeks  PLANNED INTERVENTIONS: 97164- PT Re-evaluation, 97110-Therapeutic exercises, 97530- Therapeutic activity, [id number]- Neuromuscular re-education, 97535- Self Care, 96045- Manual therapy, [id number]- Aquatic Therapy, 97014- Electrical stimulation (unattended), 97016- Vasopneumatic device, Taping, Dry Needling, Joint mobilization, Scar mobilization, Cryotherapy, and Moist heat  PLAN FOR NEXT SESSION: review/ update HEP PRN. 8 weeks out at eval, shoulder mobs, PROM/ AAROM, STW in the shoulder/ bicep. How was imaging, did she see the md?  Milo Solana PT, DPT, LAT, ATC  01/21/23  10:20 AM

## 2023-01-27 ENCOUNTER — Encounter: Payer: Self-pay | Admitting: Physical Therapy

## 2023-01-27 ENCOUNTER — Ambulatory Visit: Payer: Managed Care, Other (non HMO) | Admitting: Physical Therapy

## 2023-01-27 ENCOUNTER — Other Ambulatory Visit: Payer: Self-pay | Admitting: Family Medicine

## 2023-01-27 DIAGNOSIS — G8929 Other chronic pain: Secondary | ICD-10-CM

## 2023-01-27 DIAGNOSIS — M25512 Pain in left shoulder: Secondary | ICD-10-CM | POA: Diagnosis not present

## 2023-01-27 DIAGNOSIS — R29898 Other symptoms and signs involving the musculoskeletal system: Secondary | ICD-10-CM

## 2023-01-27 NOTE — Therapy (Signed)
OUTPATIENT PHYSICAL THERAPY TREATMENT   Patient Name: Terry Camacho MRN: [id number] DOB:November 03, 1975, 47 y.o., female Today's Date: 01/27/2023  END OF SESSION:  PT End of Session - 01/27/23 0718     Visit Number 14    Number of Visits 24    Date for PT Re-Evaluation 02/12/23    Authorization Type [employer]    PT Start Time 0716    PT Stop Time 0755    PT Time Calculation (min) 39 min                     Past Medical History:  Diagnosis Date   GERD (gastroesophageal reflux disease)    Hiatal hernia    Palpitations 11/18/2014   Past Surgical History:  Procedure Laterality Date   ESOPHAGOGASTRODUODENOSCOPY  10/14/2005   esophagitis   L shoulder rotator cuff repair Left 09/25/2022   Patient Active Problem List   Diagnosis Date Noted   Chronic left shoulder pain 12/26/2022   Acute intractable headache 03/15/2022   Chronic neck pain 03/15/2022   Trapezius strain 12/13/2021   MDD (major depressive disorder), recurrent episode, moderate (HCC) 04/02/2019   Family history of thyroid disease 03/19/2018   Class 1 obesity without serious comorbidity with body mass index (BMI) of 31.0 to 31.9 in adult 05/07/2016   History of palpitations 11/18/2014   GERD 04/28/2007    PCP: Excell Seltzer, MD  REFERRING PROVIDER: Teryl Lucy, MD   REFERRING DIAG: s/p L shoulder SAD, RCR and bicep tenodesis  THERAPY DIAG:  Chronic left shoulder pain  Weakness of left shoulder  Rationale for Evaluation and Treatment: Rehabilitation  ONSET DATE: 09/25/2022  SUBJECTIVE:                                                                                                                                                                                      SUBJECTIVE STATEMENT: "I had the MRI and they are 3 weeks behind on reading them."   PERTINENT HISTORY: See hx  PAIN:  Are you having pain? Yes: NPRS scale: 7/10 Pain location: L shoulder, anterior and posterior Pain  description: throbbing,  Aggravating factors: anything a sudden pain Relieving factors: heating pad, ibuprofen and resting.   PRECAUTIONS: Shoulder  RED FLAGS: None   WEIGHT BEARING RESTRICTIONS: Yes NWB   FALLS:  Has patient fallen in last 6 months? No  LIVING ENVIRONMENT: Lives with: lives with their family Lives in: House/apartment Stairs: Yes: Internal: 17 steps; on right going up Has following equipment at home:  sling  OCCUPATION: Facilities manager  PLOF: Independent  PATIENT GOALS: lift arm and get it moving like it should.  NEXT MD VISIT: 12/04/2022  OBJECTIVE:  Note: Objective measures were completed at Evaluation unless otherwise noted.  DIAGNOSTIC FINDINGS:  At MD's office.   PATIENT SURVEYS:  FOTO 12% and predicted 57% 12/18/2022 4% 01/16/23: 45%  COGNITION: Overall cognitive status: Within functional limits for tasks assessed     SENSATION: WFL  POSTURE: FWD  UPPER EXTREMITY ROM:   Passive ROM  Right eval Left eval Left 12/13/22 Left  12/16/2022 Left 12/18/22 Left 01/01/23 Left  01/06/23 Left 01/10/23 Left 01/16/23 Left 01/27/23  Shoulder flexion  40 P! AROM 65 PROM 110 AAROM supine 105 AROM 102 supine AROM92 ! seated Supine 120 AA Standing: 100 P 130 120A standing    Shoulder extension            Shoulder abduction  46 P!    AROM 68 ! Seated Standing: 75 SL AROM 90 P 90 105  A Standing    Shoulder adduction            Shoulder internal rotation        Reach to L5 P45  Reach to L5 L5   Shoulder external rotation  4 P!    12 !seated  Reach back of neck  P45 Reach back of neck  Reach back of neck   Elbow flexion  134    140      Elbow extension  2    12      Wrist flexion            Wrist extension            Wrist ulnar deviation            Wrist radial deviation            Wrist pronation            Wrist supination            (Blank rows = not tested)  Notes: Pain and guarding noted during assessment specifically with  shoulder movement and elbow extension/ flexion requiring use of distraction technique of pressing LE calf/ shin together.   01/01/2023 Note: AAROM with dowel rod  in standing  flexion 111 degrees, abduction 75 degrees      UPPER EXTREMITY MMT:  MMT Right eval Left eval  Shoulder flexion    Shoulder extension    Shoulder abduction    Shoulder adduction    Shoulder internal rotation    Shoulder external rotation    Middle trapezius    Lower trapezius    Elbow flexion    Elbow extension    Wrist flexion    Wrist extension    Wrist ulnar deviation    Wrist radial deviation    Wrist pronation    Wrist supination    Grip strength (lbs)    (Blank rows = not tested)  JOINT MOBILITY TESTING:  Gross guarding and stiffness noted mpacting mobility assessment  PALPATION:  TTP along the anteior aspect of the shoulder along the mid/ proximal bicep brachii,  anterior deltoid, middle deltoid, sub-acromial space, as well as the infrapsinatus. Trigger points noted int he L upper trap / levator scapulae.    TREATMENT:  [facility] Adult PT Treatment:                                                DATE: 01/27/23 Therapeutic Exercise: UBE  Pulleys  Green band row  Green band shoulder ext  Left shoulder IR GTB x 20 Left shoulder ER GTB x 20 Bilat bicep curl holding 8# total x 10, 10# x 10  Cabinet reach 1# flexion bottom shelf Cabinet reach 1# scaption bottom shelf Pec sretch in doorway single and bilateral UE  Sleeper stretch on wall Left      [facility] Adult PT Treatment:                                                DATE: 01/21/2023 Therapeutic Exercise: UBE L3 x 5 (FWD/BWD 2:30 ea) Upper trap/ levator scap stretch on the L 1 x 30 sec UE ranger flexion/ abduction and IR 2 x 12 Standing shoulder PNF D2 1 x 6  Self Care: Continued to review pain  eduation hurt vs harm  [facility] Adult PT Treatment:                                                DATE: 01/16/23 Therapeutic Exercise: Pulleys x 20 min flex, 2 min scaption UBE 2 min each @ level 1.5 Cabinet reach bottom shelf L AROM x 10 (shoulder height)  Cabinet reach into scaption L AROM x 10 (shoulder height)  Standing Red band Shoulder ER/IR x 15 each  Standing red band protraction /short lever flexion x 10  Green band Row x 15 Green band shoulder ext bilat  x15 Standing shoulder flexion wall slides  approx 130 deg x 10 Standing shoulder scaption wall slides  x 10 Bilat bicep curl holding 6# total x 10, 8# x 10  Standing doorway stretch for L ER single arm, low 30 sec x 3 Standing IR towel wash lower back x 10   [facility] Adult PT Treatment:                                                DATE: 01/10/23 Therapeutic Exercise: Pulleys  Standing AAROM shoulder flexion and abduction YTB IR and ER x 15 each towel under elbow Supine Dowel: horiz abdct addct, chest press, pullovers,  Sidelying shoulder flexion  Sidelying shoulder abduction Sidelying shoulder ER  Supine protraction  Supine shoulder flexion long lever x 10  Manual Therapy: Passive shoulder flexion, abdct, IR and ER as tolerated        PATIENT EDUCATION: Education details: Evaluation findings, POC, goals, HEP with proper form/ rationale.  Person educated: Patient Education method: Explanation, Verbal cues, and Handouts Education comprehension: verbalized understanding  HOME EXERCISE PROGRAM: Access Code: [email or web address] URL: [email or web address] Date: 12/30/2022 Prepared by: Lulu Riding  Exercises - Seated Upper Trapezius Stretch  - 1 x daily - 7 x weekly - 2 sets - 2 reps - 30 seconds hold - Gentle Levator Scapulae Stretch  -  1 x daily - 7 x weekly - 2 sets - 2 reps - 30 seconds hold - Shoulder Table Slides Flexion  - 2 x daily - 3 sets - 10 reps - 5 hold - Shoulder table slides ABD  - 2 x daily  - 3 sets - 10 reps - 5 hold - Seated Gripping Towel  - 1 x daily - 7 x weekly - 2 sets - 10 reps - Supine Elbow Flexion Extension AROM  - 1 x daily - 7 x weekly - 2 sets - 10 reps - Standing Shoulder External Rotation AAROM with Dowel  - 1 x daily - 7 x weekly - 2 sets - 10 reps - Shoulder Flexion Overhead with Dowel  - 1 x daily - 7 x weekly - 2 sets - 10 reps - Standing Shoulder Abduction AAROM with Dowel  - 1 x daily - 7 x weekly - 2 sets - 10 reps - Standing Isometric Shoulder Internal Rotation at Doorway (Mirrored)  - 1 x daily - 7 x weekly - 2 sets - 10 reps - 10 seconds hold - Standing Isometric Shoulder Flexion with Doorway - Arm Bent  - 1 x daily - 7 x weekly - 2 sets - 10 reps - 10 seconds hold - Standing Isometric Shoulder External Rotation with Doorway and Towel Roll  - 1 x daily - 7 x weekly - 2 sets - 10 reps - 10 seconds hold - Standing Isometric Shoulder Extension with Doorway - Arm Bent  - 1 x daily - 7 x weekly - 2 sets - 10 reps - seconds hold - Standing Isometric Shoulder Abduction with Doorway - Arm Bent  - 1 x daily - 7 x weekly - 2 sets - 10 reps - 10 seconds hold - Seated Shoulder External Rotation PROM on Table  - 3 x daily - 7 x weekly - 2 sets - 10 reps - 30-60 sec hold - Standing Bilateral Shoulder Internal Rotation AAROM with Dowel  - 1 x daily - 7 x weekly - 2 sets - 10 reps - 5 sec hold - Prone Chest Stretch on Chair  - 1 x daily - 7 x weekly - 1 sets - 3-5 reps - 2 min hold  ASSESSMENT:  CLINICAL IMPRESSION: 01/27/2023 Erice had her MRI arthrogram last week and is awaiting results. The procedure elevated her pain, she is 7/10 on arrival today. Continued with RTC strengthening and shoulder stretches. She is limited by muscle tightness.    Assessment at Evaluation: Patient is a 47 y.o. F who was seen today for physical therapy evaluation and treatment for L shoulder s/p RCR, Bicep Tenodesis and SAD on 09/26/22. She is 8 weeks post op today and saw her MD on 10/2  releasing her from her sling. She has limited PROM in all planes secondary to pain and guarding, MMT was withheld due to precautions. Discussed letting her arm hang down vs keeping  her elbow flexed at her side. Provided initial HEP  and pt noted soreness had reduced compared to when she arrived. She would benefit from physical therapy to reduce L shoulder pain, increase ROM and strength, improve LUE strength and overall function by addressing the deficits listed.    OBJECTIVE IMPAIRMENTS: decreased activity tolerance, decreased endurance, decreased ROM, decreased strength, increased edema, increased fascial restrictions, increased muscle spasms, impaired flexibility, impaired UE functional use, improper body mechanics, postural dysfunction, and pain.   ACTIVITY LIMITATIONS: carrying, lifting, and reach over head  PARTICIPATION  LIMITATIONS: cleaning, laundry, community activity, occupation, and Exercise  PERSONAL FACTORS: Past/current experiences and Time since onset of injury/illness/exacerbation are also affecting patient's functional outcome.   REHAB POTENTIAL: Excellent  CLINICAL DECISION MAKING: Stable/uncomplicated  EVALUATION COMPLEXITY: Low   GOALS: Goals reviewed with patient? Yes  SHORT TERM GOALS: Target date: 01/01/2023  Pt to be IND with initial HEP for therapeutic progression Baseline: Goal status: MET 01/01/2023  2.  Increase L shoulder AAROM Flexion/ abduction to >/= 120 degrees with </= 5/10 pain for progression of ROM Baseline:  01/16/23: ROM met however pain 6/10 Goal status: PARTIALLY MET 01/16/23  3.  Improve FOTO score to >/= 30% to demonstrate improving function Baseline: 12% 12/18/22: 4% 01/16/23: 45%  Goal status: Ongoing   4.  Pt to demonstrate LUE strength to >/= 3/5 to demonstrate progressing strength and function Baseline:  Status: unable to assess accurately due to pain/ guarding Goal status: Ongoing 01/01/2023  LONG TERM GOALS: Target date:  02/12/2023   Pt have functional L shoulder AROM in all planes compared bil with no report of pain or limitatoins Baseline:  01/16/23: see chart Goal status: ONGOING  2.  Pt to be a lift and lower >/= 10# to and from a overhead shelf with </= 2/10 pain or functional strength required for Adls Baseline:  01/16/23: began shoulder height reaching Goal status: ONGOING  3.  Pt to be able to lift/ lower >/= 20# to / from waist to floor for functional lifting and strengthening Baseline:  Goal status: ONGOING  4.  Pt be able to push/ pull >/= 20# with no report of pain or restrictions and be able to return to personal workout routine with gradual progression without pain Baseline:  01/16/23: able to push and pull light therabands Goal status: ONGOING  5.  Pt to increase FOTO score to >/= 57% to demonstrate improvement in function Baseline:  01/16/23: 45%  Goal status: ONGOING  6.  Pt to be IND with all HEP and able to maintain and progress current LOF IND  Baseline:  Goal status: ONGOING  PLAN:  PT FREQUENCY: 1-2x/week  PT DURATION: 12 weeks  PLANNED INTERVENTIONS: 97164- PT Re-evaluation, 97110-Therapeutic exercises, 97530- Therapeutic activity, [id number]- Neuromuscular re-education, 97535- Self Care, 52841- Manual therapy, [id number]- Aquatic Therapy, 97014- Electrical stimulation (unattended), 97016- Vasopneumatic device, Taping, Dry Needling, Joint mobilization, Scar mobilization, Cryotherapy, and Moist heat  PLAN FOR NEXT SESSION: review/ update HEP PRN. 8 weeks out at eval, shoulder mobs, PROM/ AAROM, STW in the shoulder/ bicep. How was imaging, did she see the md?  Jannette Spanner, PTA 01/27/23 1:08 PM Phone: [phone] Fax: [phone]

## 2023-01-28 MED ORDER — DICLOFENAC SODIUM 75 MG PO TBEC: 75.0000 mg | DELAYED_RELEASE_TABLET | Freq: Two times a day (BID) | ORAL | 0 refills | Status: DC

## 2023-01-28 NOTE — Telephone Encounter (Signed)
Last office visit 12/26/2022 for MDD and chronic left shoulder pain.  Last refilled 12/29/2022 for #30 with no refills.  Next Appt: No future appointments with PCP.

## 2023-02-03 ENCOUNTER — Ambulatory Visit: Payer: Managed Care, Other (non HMO) | Admitting: Physical Therapy

## 2023-02-03 DIAGNOSIS — G8929 Other chronic pain: Secondary | ICD-10-CM

## 2023-02-03 DIAGNOSIS — R29898 Other symptoms and signs involving the musculoskeletal system: Secondary | ICD-10-CM

## 2023-02-03 DIAGNOSIS — M25512 Pain in left shoulder: Secondary | ICD-10-CM | POA: Diagnosis not present

## 2023-02-03 NOTE — Therapy (Signed)
OUTPATIENT PHYSICAL THERAPY TREATMENT / Re-certification   Patient Name: Terry Camacho MRN: [id number] DOB:[birth year], 47 y.o., female Today's Date: 02/03/2023  END OF SESSION:  PT End of Session - 02/03/23 1252     Visit Number 15    Number of Visits 23    Date for PT Re-Evaluation 03/31/23    PT Start Time 1149    PT Stop Time 1230    PT Time Calculation (min) 41 min    Activity Tolerance Patient tolerated treatment well    Behavior During Therapy [facility] for tasks assessed/performed                      Past Medical History:  Diagnosis Date   GERD (gastroesophageal reflux disease)    Hiatal hernia    Palpitations 11/18/2014   Past Surgical History:  Procedure Laterality Date   ESOPHAGOGASTRODUODENOSCOPY  10/14/2005   esophagitis   L shoulder rotator cuff repair Left 09/25/2022   Patient Active Problem List   Diagnosis Date Noted   Chronic left shoulder pain 12/26/2022   Acute intractable headache 03/15/2022   Chronic neck pain 03/15/2022   Trapezius strain 12/13/2021   MDD (major depressive disorder), recurrent episode, moderate (HCC) 04/02/2019   Family history of thyroid disease 03/19/2018   Class 1 obesity without serious comorbidity with body mass index (BMI) of 31.0 to 31.9 in adult 05/07/2016   History of palpitations 11/18/2014   GERD 04/28/2007    PCP: Excell Seltzer, MD  REFERRING PROVIDER: Teryl Lucy, MD   REFERRING DIAG: s/p L shoulder SAD, RCR and bicep tenodesis  THERAPY DIAG:  Chronic left shoulder pain  Weakness of left shoulder  Rationale for Evaluation and Treatment: Rehabilitation  ONSET DATE: 09/25/2022  SUBJECTIVE:                                                                                                                                                                                      SUBJECTIVE STATEMENT: "Saw the Md and reviewed the MRI and there is a small tear but per the MD he thinks staying the  course and no operative procedures needed right now."   PERTINENT HISTORY: See hx  PAIN:  Are you having pain? Yes: NPRS scale: 6/10 Pain location: L shoulder, anterior and posterior Pain description: throbbing,  Aggravating factors: anything a sudden pain Relieving factors: heating pad, ibuprofen and resting.   PRECAUTIONS: Shoulder  RED FLAGS: None   WEIGHT BEARING RESTRICTIONS: Yes NWB   FALLS:  Has patient fallen in last 6 months? No  LIVING ENVIRONMENT: Lives with: lives with their family Lives in: House/apartment Stairs: Yes: Internal: 17 steps;  on right going up Has following equipment at home:  sling  OCCUPATION: Facilities manager  PLOF: Independent  PATIENT GOALS: lift arm and get it moving like it should.   NEXT MD VISIT: 12/04/2022  OBJECTIVE:  Note: Objective measures were completed at Evaluation unless otherwise noted.  DIAGNOSTIC FINDINGS:  At MD's office.   PATIENT SURVEYS:  FOTO 12% and predicted 57% 12/18/2022 4% 01/16/23: 45%  COGNITION: Overall cognitive status: Within functional limits for tasks assessed     SENSATION: WFL  POSTURE: FWD  UPPER EXTREMITY ROM:   Passive ROM  Right eval Left eval Left 12/13/22 Left  12/16/2022 Left 12/18/22 Left 01/01/23 Left  01/06/23 Left 01/10/23 Left 01/16/23 Left 01/27/23  Shoulder flexion  40 P! AROM 65 PROM 110 AAROM supine 105 AROM 102 supine [id number] ! seated Supine 120 AA Standing: 100 P 130 120A standing    Shoulder extension            Shoulder abduction  46 P!    AROM 68 ! Seated Standing: 75 SL AROM 90 P 90 105  A Standing    Shoulder adduction            Shoulder internal rotation        Reach to L5 P45  Reach to L5 L5   Shoulder external rotation  4 P!    12 !seated  Reach back of neck  P45 Reach back of neck  Reach back of neck   Elbow flexion  134    140      Elbow extension  2    12      Wrist flexion            Wrist extension            Wrist ulnar deviation             Wrist radial deviation            Wrist pronation            Wrist supination            (Blank rows = not tested)  Notes: Pain and guarding noted during assessment specifically with shoulder movement and elbow extension/ flexion requiring use of distraction technique of pressing LE calf/ shin together.   01/01/2023 Note: AAROM with dowel rod  in standing  flexion 111 degrees, abduction 75 degrees      UPPER EXTREMITY MMT:  MMT Right eval Left eval  Shoulder flexion    Shoulder extension    Shoulder abduction    Shoulder adduction    Shoulder internal rotation    Shoulder external rotation    Middle trapezius    Lower trapezius    Elbow flexion    Elbow extension    Wrist flexion    Wrist extension    Wrist ulnar deviation    Wrist radial deviation    Wrist pronation    Wrist supination    Grip strength (lbs)    (Blank rows = not tested)  JOINT MOBILITY TESTING:  Gross guarding and stiffness noted mpacting mobility assessment  PALPATION:  TTP along the anteior aspect of the shoulder along the mid/ proximal bicep brachii,  anterior deltoid, middle deltoid, sub-acromial space, as well as the infrapsinatus. Trigger points noted int he L upper trap / levator scapulae.    TREATMENT:  [facility] Adult PT Treatment:                                                DATE: 02/03/2023 Therapeutic Exercise: UBE L5  5 min (FWD/BWD x 2:30) Upper trap stretch 2 x 30 sec  Supine PNF D2 2 x 15 - 2nd set 7 reps isotonic, the 8 reps with focu on eccentrics Supine shoulder press LUE only 3 x 10 - using wall as tactile feedback. Scooting away from wall every set to promote ROM and strength.  Supine ER 1 x 5 isotonics, 2 x 10 with YTB con/ecc with focus on eccentrics.  Shoulder IR stepping away with eccentrics 2 x 10 with YTB   [facility] Adult PT Treatment:                                                 DATE: 01/27/23 Therapeutic Exercise: UBE  Pulleys  Green band row  Green band shoulder ext  Left shoulder IR GTB x 20 Left shoulder ER GTB x 20 Bilat bicep curl holding 8# total x 10, 10# x 10  Cabinet reach 1# flexion bottom shelf Cabinet reach 1# scaption bottom shelf Pec sretch in doorway single and bilateral UE  Sleeper stretch on wall Left    [facility] Adult PT Treatment:                                                DATE: 01/21/2023 Therapeutic Exercise: UBE L3 x 5 (FWD/BWD 2:30 ea) Upper trap/ levator scap stretch on the L 1 x 30 sec UE ranger flexion/ abduction and IR 2 x 12 Standing shoulder PNF D2 1 x 6  Self Care: Continued to review pain eduation hurt vs harm  PATIENT EDUCATION: Education details: Evaluation findings, POC, goals, HEP with proper form/ rationale.  Person educated: Patient Education method: Explanation, Verbal cues, and Handouts Education comprehension: verbalized understanding  HOME EXERCISE PROGRAM: Access Code: [email or web address] URL: [email or web address] Date: 12/30/2022 Prepared by: Lulu Riding  Exercises - Seated Upper Trapezius Stretch  - 1 x daily - 7 x weekly - 2 sets - 2 reps - 30 seconds hold - Gentle Levator Scapulae Stretch  - 1 x daily - 7 x weekly - 2 sets - 2 reps - 30 seconds hold - Shoulder Table Slides Flexion  - 2 x daily - 3 sets - 10 reps - 5 hold - Shoulder table slides ABD  - 2 x daily - 3 sets - 10 reps - 5 hold - Seated Gripping Towel  - 1 x daily - 7 x weekly - 2 sets - 10 reps - Supine Elbow Flexion Extension AROM  - 1 x daily - 7 x weekly - 2 sets - 10 reps - Standing Shoulder External Rotation AAROM with Dowel  - 1 x daily - 7 x weekly - 2 sets - 10 reps - Shoulder Flexion Overhead with Dowel  - 1 x daily - 7 x weekly - 2 sets - 10 reps - Standing Shoulder Abduction AAROM with Dowel  - 1 x daily - 7 x  weekly - 2 sets - 10 reps - Standing Isometric Shoulder  Internal Rotation at Doorway (Mirrored)  - 1 x daily - 7 x weekly - 2 sets - 10 reps - 10 seconds hold - Standing Isometric Shoulder Flexion with Doorway - Arm Bent  - 1 x daily - 7 x weekly - 2 sets - 10 reps - 10 seconds hold - Standing Isometric Shoulder External Rotation with Doorway and Towel Roll  - 1 x daily - 7 x weekly - 2 sets - 10 reps - 10 seconds hold - Standing Isometric Shoulder Extension with Doorway - Arm Bent  - 1 x daily - 7 x weekly - 2 sets - 10 reps - seconds hold - Standing Isometric Shoulder Abduction with Doorway - Arm Bent  - 1 x daily - 7 x weekly - 2 sets - 10 reps - 10 seconds hold - Seated Shoulder External Rotation PROM on Table  - 3 x daily - 7 x weekly - 2 sets - 10 reps - 30-60 sec hold - Standing Bilateral Shoulder Internal Rotation AAROM with Dowel  - 1 x daily - 7 x weekly - 2 sets - 10 reps - 5 sec hold - Prone Chest Stretch on Chair  - 1 x daily - 7 x weekly - 1 sets - 3-5 reps - 2 min hold  ASSESSMENT:  CLINICAL IMPRESSION: 02/03/2023 Mrs Jerue presents to physical therapy noting she saw the referring MD and went over her imaging results which she noted she does have a small tear that may be still in the process of healing. Continued strengthening today but focused on removing gravity to allow for full ROM and strengthening. She did very well with it but does fatigue quickly and requires cues for proper form, continued progressing strength as she fatigued with controlled eccentrics. End of session she noted pain dropped from a 6/10 to a 4/10 which continues to be a positive sign. She is making progress with her ROM and strength gradually. She would benefit from continued strengthening to promote both ROM and strength and maximize her function.    Assessment at Evaluation: Patient is a 47 y.o. F who was seen today for physical therapy evaluation and treatment for L shoulder s/p RCR, Bicep Tenodesis and SAD on 09/26/22. She is 8 weeks post op today and saw  her MD on 10/2 releasing her from her sling. She has limited PROM in all planes secondary to pain and guarding, MMT was withheld due to precautions. Discussed letting her arm hang down vs keeping  her elbow flexed at her side. Provided initial HEP  and pt noted soreness had reduced compared to when she arrived. She would benefit from physical therapy to reduce L shoulder pain, increase ROM and strength, improve LUE strength and overall function by addressing the deficits listed.    OBJECTIVE IMPAIRMENTS: decreased activity tolerance, decreased endurance, decreased ROM, decreased strength, increased edema, increased fascial restrictions, increased muscle spasms, impaired flexibility, impaired UE functional use, improper body mechanics, postural dysfunction, and pain.   ACTIVITY LIMITATIONS: carrying, lifting, and reach over head  PARTICIPATION LIMITATIONS: cleaning, laundry, community activity, occupation, and Exercise  PERSONAL FACTORS: Past/current experiences and Time since onset of injury/illness/exacerbation are also affecting patient's functional outcome.   REHAB POTENTIAL: Excellent  CLINICAL DECISION MAKING: Stable/uncomplicated  EVALUATION COMPLEXITY: Low   GOALS: Goals reviewed with patient? Yes  SHORT TERM GOALS: Target date: 01/01/2023  Pt to be IND with initial HEP for therapeutic progression Baseline: Goal  status: MET 01/01/2023  2.  Increase L shoulder AAROM Flexion/ abduction to >/= 120 degrees with </= 5/10 pain for progression of ROM Baseline:  01/16/23: ROM met however pain 6/10 12/30/204: reported pain dropped to 4/10 after exercise Goal status: MET 02/03/23  3.  Improve FOTO score to >/= 30% to demonstrate improving function Baseline: 12% 12/18/22: 4% 01/16/23: 45%  02/03/2023 not assessed Goal status: Ongoing 02/03/2023  4.  Pt to demonstrate LUE strength to >/= 3/5 to demonstrate progressing strength and function Baseline:  Status: unable to assess  accurately due to pain/ guarding Goal status: Ongoing 01/01/2023  LONG TERM GOALS: Target date:  UPDATED 03/21/2023   Pt have functional L shoulder AROM in all planes compared bil with no report of pain or limitatoins Baseline:  01/16/23: see chart 12/30/S25: PAIN 4/10 end of session  Goal status: Ongoing 02/03/2023  2.  Pt to be a lift and lower >/= 10# to and from a overhead shelf with </= 2/10 pain or functional strength required for Adls Baseline:  01/16/23: began shoulder height reaching 02/03/23: pain at 4/10, unable to lift 10#, still on 5# restriction Goal status: ONGOING 02/03/2023  3.  Pt to be able to lift/ lower >/= 20# to / from waist to floor for functional lifting and strengthening Baseline:  Goal status: ONGOING 02/03/23  4.  Pt be able to push/ pull >/= 20# with no report of pain or restrictions and be able to return to personal workout routine with gradual progression without pain Baseline:  01/16/23: able to push and pull light therabands Goal status: ONGOING 02/03/23  5.  Pt to increase FOTO score to >/= 57% to demonstrate improvement in function Baseline:  01/16/23: 45%  Goal status: ONGOING 02/03/23  6.  Pt to be IND with all HEP and able to maintain and progress current LOF IND  Baseline:  Goal status: ONGOING 02/03/23  PLAN:  PT FREQUENCY: 1-2x/week  PT DURATION: 12 weeks  PLANNED INTERVENTIONS: 97164- PT Re-evaluation, 97110-Therapeutic exercises, 97530- Therapeutic activity, 97112- Neuromuscular re-education, 97535- Self Care, [id number]- Manual therapy, [id number]- Aquatic Therapy, 97014- Electrical stimulation (unattended), 97016- Vasopneumatic device, Taping, Dry Needling, Joint mobilization, Scar mobilization, Cryotherapy, and Moist heat  PLAN FOR NEXT SESSION: review/ update HEP PRN. 8 weeks out at eval, shoulder mobs, AAROM, STW in the shoulder/ bicep. Continue strengthening progressing from supine to sitting reaching overhead.   Alekzander Cardell  PT, DPT, LAT, ATC  02/03/23  12:56 PM

## 2023-02-10 ENCOUNTER — Encounter: Payer: Self-pay | Admitting: Physical Therapy

## 2023-02-10 ENCOUNTER — Ambulatory Visit: Payer: Managed Care, Other (non HMO) | Attending: Orthopedic Surgery | Admitting: Physical Therapy

## 2023-02-10 DIAGNOSIS — G8929 Other chronic pain: Secondary | ICD-10-CM | POA: Diagnosis present

## 2023-02-10 DIAGNOSIS — M25512 Pain in left shoulder: Secondary | ICD-10-CM | POA: Insufficient documentation

## 2023-02-10 DIAGNOSIS — R29898 Other symptoms and signs involving the musculoskeletal system: Secondary | ICD-10-CM | POA: Diagnosis present

## 2023-02-10 NOTE — Therapy (Addendum)
 OUTPATIENT PHYSICAL THERAPY TREATMENT  PHYSICAL THERAPY DISCHARGE SUMMARY  Visits from Start of Care: 16  Current functional level related to goals / functional outcomes: See goals   Remaining deficits: Continued shoulder pain, limited AROM/ Strength   Education / Equipment: HEP, posture, pain education   Patient agrees to discharge. Patient goals were not met. Patient is being discharged due to the physician's request.  Joneen Fresh PT, DPT, LAT, ATC  02/27/23  3:21 PM      Patient Name: Jolyn Deshmukh MRN: [id number] DOB:03-Apr-1975, 48 y.o., female Today's Date: 02/10/2023  END OF SESSION:  PT End of Session - 02/10/23 0718     Visit Number 16    Number of Visits 23    Date for PT Re-Evaluation 03/31/23    Authorization Type [employer]    PT Start Time 0715    PT Stop Time 0800    PT Time Calculation (min) 45 min                      Past Medical History:  Diagnosis Date   GERD (gastroesophageal reflux disease)    Hiatal hernia    Palpitations 11/18/2014   Past Surgical History:  Procedure Laterality Date   ESOPHAGOGASTRODUODENOSCOPY  10/14/2005   esophagitis   L shoulder rotator cuff repair Left 09/25/2022   Patient Active Problem List   Diagnosis Date Noted   Chronic left shoulder pain 12/26/2022   Acute intractable headache 03/15/2022   Chronic neck pain 03/15/2022   Trapezius strain 12/13/2021   MDD (major depressive disorder), recurrent episode, moderate (HCC) 04/02/2019   Family history of thyroid  disease 03/19/2018   Class 1 obesity without serious comorbidity with body mass index (BMI) of 31.0 to 31.9 in adult 05/07/2016   History of palpitations 11/18/2014   GERD 04/28/2007    PCP: Avelina Greig BRAVO, MD  REFERRING PROVIDER: Josefina Chew, MD   REFERRING DIAG: s/p L shoulder SAD, RCR and bicep tenodesis  THERAPY DIAG:  Chronic left shoulder pain  Weakness of left shoulder  Rationale for Evaluation and Treatment:  Rehabilitation  ONSET DATE: 09/25/2022  SUBJECTIVE:                                                                                                                                                                                      SUBJECTIVE STATEMENT: I am so frustrated. It is 5 months out.    PERTINENT HISTORY: See hx  PAIN:  Are you having pain? Yes: NPRS scale: 6-7/10 Pain location: L shoulder, anterior and posterior Pain description: throbbing,  Aggravating factors: anything a sudden pain Relieving factors: heating pad, ibuprofen  and resting.  PRECAUTIONS: Shoulder  RED FLAGS: None   WEIGHT BEARING RESTRICTIONS: Yes NWB   FALLS:  Has patient fallen in last 6 months? No  LIVING ENVIRONMENT: Lives with: lives with their family Lives in: House/apartment Stairs: Yes: Internal: 17 steps; on right going up Has following equipment at home:  sling  OCCUPATION: Facilities manager  PLOF: Independent  PATIENT GOALS: lift arm and get it moving like it should.   NEXT MD VISIT: 12/04/2022  OBJECTIVE:  Note: Objective measures were completed at Evaluation unless otherwise noted.  DIAGNOSTIC FINDINGS:  At MD's office.   PATIENT SURVEYS:  FOTO 12% and predicted 57% 12/18/2022 4% 01/16/23: 45%  COGNITION: Overall cognitive status: Within functional limits for tasks assessed     SENSATION: WFL  POSTURE: FWD  UPPER EXTREMITY ROM:   Passive ROM  Left eval Left 12/13/22 Left  12/16/2022 Left 12/18/22 Left 01/01/23 Left  01/06/23 Left 01/10/23 Left 01/16/23 Left 01/27/23 Left 02/10/23  Shoulder flexion 40 P! AROM 65 PROM 110 AAROM supine 105 AROM 102 supine AROM92 ! seated Supine 120 AA Standing: 100 P 130 120A standing     Shoulder extension            Shoulder abduction 46 P!    AROM 68 ! Seated Standing: 75 SL AROM 90 P 90 105  A Standing   120 A standing at end of session  Shoulder adduction            Shoulder internal rotation       Reach to  L5 P45  Reach to L5 L5    Shoulder external rotation 4 P!    12 !seated  Reach back of neck  P45 Reach back of neck  Reach back of neck    Elbow flexion 134    140       Elbow extension 2    12       Wrist flexion            Wrist extension            Wrist ulnar deviation            Wrist radial deviation            Wrist pronation            Wrist supination            (Blank rows = not tested)  Notes: Pain and guarding noted during assessment specifically with shoulder movement and elbow extension/ flexion requiring use of distraction technique of pressing LE calf/ shin together.   01/01/2023 Note: AAROM with dowel rod  in standing  flexion 111 degrees, abduction 75 degrees      UPPER EXTREMITY MMT:  MMT Right eval Left eval  Shoulder flexion    Shoulder extension    Shoulder abduction    Shoulder adduction    Shoulder internal rotation    Shoulder external rotation    Middle trapezius    Lower trapezius    Elbow flexion    Elbow extension    Wrist flexion    Wrist extension    Wrist ulnar deviation    Wrist radial deviation    Wrist pronation    Wrist supination    Grip strength (lbs)    (Blank rows = not tested)  JOINT MOBILITY TESTING:  Gross guarding and stiffness noted mpacting mobility assessment  PALPATION:  TTP along the anteior aspect of the shoulder along the mid/ proximal bicep brachii,  anterior deltoid, middle deltoid, sub-acromial space, as well as the infrapsinatus. Trigger points noted int he L upper trap / levator scapulae.    TREATMENT:                                                                                                                                         [facility] Adult PT Treatment:                                                DATE: 02/10/23 Therapeutic Exercise: UBE L2 x 3 min each  Pulleys  Corner stretch x 3  Open books at wall  Green band row  Green band Ext Sidelying abduction pt only able to reach 90 Supine PNF D2  flexion AROM Prone ITY 8 reps each with thump up , thumb out variations  Manual Therapy: PROM flexion, Abduction 120 , ER at 90 is 70 degrees , ER at neutral 25     [facility] Adult PT Treatment:                                                DATE: 02/03/2023 Therapeutic Exercise: UBE L5  5 min (FWD/BWD x 2:30) Upper trap stretch 2 x 30 sec  Supine PNF D2 2 x 15 - 2nd set 7 reps isotonic, the 8 reps with focu on eccentrics Supine shoulder press LUE only 3 x 10 - using wall as tactile feedback. Scooting away from wall every set to promote ROM and strength.  Supine ER 1 x 5 isotonics, 2 x 10 with YTB con/ecc with focus on eccentrics.  Shoulder IR stepping away with eccentrics 2 x 10 with YTB   [facility] Adult PT Treatment:                                                DATE: 01/27/23 Therapeutic Exercise: UBE  Pulleys  Green band row  Green band shoulder ext  Left shoulder IR GTB x 20 Left shoulder ER GTB x 20 Bilat bicep curl holding 8# total x 10, 10# x 10  Cabinet reach 1# flexion bottom shelf Cabinet reach 1# scaption bottom shelf Pec sretch in doorway single and bilateral UE  Sleeper stretch on wall Left    [facility] Adult PT Treatment:  DATE: 01/21/2023 Therapeutic Exercise: UBE L3 x 5 (FWD/BWD 2:30 ea) Upper trap/ levator scap stretch on the L 1 x 30 sec UE ranger flexion/ abduction and IR 2 x 12 Standing shoulder PNF D2 1 x 6  Self Care: Continued to review pain eduation hurt vs harm  PATIENT EDUCATION: Education details: Evaluation findings, POC, goals, HEP with proper form/ rationale.  Person educated: Patient Education method: Explanation, Verbal cues, and Handouts Education comprehension: verbalized understanding  HOME EXERCISE PROGRAM: Access Code: [email or web address] URL: [email or web address] Date: 12/30/2022 Prepared by: Joneen Fresh  Exercises - Seated Upper Trapezius Stretch  - 1 x daily - 7 x weekly - 2  sets - 2 reps - 30 seconds hold - Gentle Levator Scapulae Stretch  - 1 x daily - 7 x weekly - 2 sets - 2 reps - 30 seconds hold - Shoulder Table Slides Flexion  - 2 x daily - 3 sets - 10 reps - 5 hold - Shoulder table slides ABD  - 2 x daily - 3 sets - 10 reps - 5 hold - Seated Gripping Towel  - 1 x daily - 7 x weekly - 2 sets - 10 reps - Supine Elbow Flexion Extension AROM  - 1 x daily - 7 x weekly - 2 sets - 10 reps - Standing Shoulder External Rotation AAROM with Dowel  - 1 x daily - 7 x weekly - 2 sets - 10 reps - Shoulder Flexion Overhead with Dowel  - 1 x daily - 7 x weekly - 2 sets - 10 reps - Standing Shoulder Abduction AAROM with Dowel  - 1 x daily - 7 x weekly - 2 sets - 10 reps - Standing Isometric Shoulder Internal Rotation at Doorway (Mirrored)  - 1 x daily - 7 x weekly - 2 sets - 10 reps - 10 seconds hold - Standing Isometric Shoulder Flexion with Doorway - Arm Bent  - 1 x daily - 7 x weekly - 2 sets - 10 reps - 10 seconds hold - Standing Isometric Shoulder External Rotation with Doorway and Towel Roll  - 1 x daily - 7 x weekly - 2 sets - 10 reps - 10 seconds hold - Standing Isometric Shoulder Extension with Doorway - Arm Bent  - 1 x daily - 7 x weekly - 2 sets - 10 reps - seconds hold - Standing Isometric Shoulder Abduction with Doorway - Arm Bent  - 1 x daily - 7 x weekly - 2 sets - 10 reps - 10 seconds hold - Seated Shoulder External Rotation PROM on Table  - 3 x daily - 7 x weekly - 2 sets - 10 reps - 30-60 sec hold - Standing Bilateral Shoulder Internal Rotation AAROM with Dowel  - 1 x daily - 7 x weekly - 2 sets - 10 reps - 5 sec hold - Prone Chest Stretch on Chair  - 1 x daily - 7 x weekly - 1 sets - 3-5 reps - 2 min hold  ASSESSMENT:  CLINICAL IMPRESSION: 02/10/2023: Pt continues to be limited with AROM. Worked on PNF movements and prone scapular activation as well as manual to increased ROM. Pt demonstrated improved abduction at end of session.    RE-eval12/30/24 : Mrs  Stege presents to physical therapy noting she saw the referring MD and went over her imaging results which she noted she does have a small tear that may be still in the process of healing. Continued strengthening today but focused on removing gravity to  allow for full ROM and strengthening. She did very well with it but does fatigue quickly and requires cues for proper form, continued progressing strength as she fatigued with controlled eccentrics. End of session she noted pain dropped from a 6/10 to a 4/10 which continues to be a positive sign. She is making progress with her ROM and strength gradually. She would benefit from continued strengthening to promote both ROM and strength and maximize her function.    Assessment at Evaluation: Patient is a 48 y.o. F who was seen today for physical therapy evaluation and treatment for L shoulder s/p RCR, Bicep Tenodesis and SAD on 09/26/22. She is 8 weeks post op today and saw her MD on 10/2 releasing her from her sling. She has limited PROM in all planes secondary to pain and guarding, MMT was withheld due to precautions. Discussed letting her arm hang down vs keeping  her elbow flexed at her side. Provided initial HEP  and pt noted soreness had reduced compared to when she arrived. She would benefit from physical therapy to reduce L shoulder pain, increase ROM and strength, improve LUE strength and overall function by addressing the deficits listed.    OBJECTIVE IMPAIRMENTS: decreased activity tolerance, decreased endurance, decreased ROM, decreased strength, increased edema, increased fascial restrictions, increased muscle spasms, impaired flexibility, impaired UE functional use, improper body mechanics, postural dysfunction, and pain.   ACTIVITY LIMITATIONS: carrying, lifting, and reach over head  PARTICIPATION LIMITATIONS: cleaning, laundry, community activity, occupation, and Exercise  PERSONAL FACTORS: Past/current experiences and Time since onset of  injury/illness/exacerbation are also affecting patient's functional outcome.   REHAB POTENTIAL: Excellent  CLINICAL DECISION MAKING: Stable/uncomplicated  EVALUATION COMPLEXITY: Low   GOALS: Goals reviewed with patient? Yes  SHORT TERM GOALS: Target date: 01/01/2023  Pt to be IND with initial HEP for therapeutic progression Baseline: Goal status: MET 01/01/2023  2.  Increase L shoulder AAROM Flexion/ abduction to >/= 120 degrees with </= 5/10 pain for progression of ROM Baseline:  01/16/23: ROM met however pain 6/10 12/30/204: reported pain dropped to 4/10 after exercise Goal status: MET 02/03/23  3.  Improve FOTO score to >/= 30% to demonstrate improving function Baseline: 12% 12/18/22: 4% 01/16/23: 45%  02/03/2023 not assessed Goal status: Ongoing 02/03/2023  4.  Pt to demonstrate LUE strength to >/= 3/5 to demonstrate progressing strength and function Baseline:  Status: unable to assess accurately due to pain/ guarding Goal status: Ongoing 01/01/2023  LONG TERM GOALS: Target date:  UPDATED 03/21/2023   Pt have functional L shoulder AROM in all planes compared bil with no report of pain or limitatoins Baseline:  01/16/23: see chart 12/30/S25: PAIN 4/10 end of session  Goal status: Ongoing 02/03/2023  2.  Pt to be a lift and lower >/= 10# to and from a overhead shelf with </= 2/10 pain or functional strength required for Adls Baseline:  01/16/23: began shoulder height reaching 02/03/23: pain at 4/10, unable to lift 10#, still on 5# restriction Goal status: ONGOING 02/03/2023  3.  Pt to be able to lift/ lower >/= 20# to / from waist to floor for functional lifting and strengthening Baseline:  Goal status: ONGOING 02/03/23  4.  Pt be able to push/ pull >/= 20# with no report of pain or restrictions and be able to return to personal workout routine with gradual progression without pain Baseline:  01/16/23: able to push and pull light therabands Goal status:  ONGOING 02/03/23  5.  Pt to increase FOTO score  to >/= 57% to demonstrate improvement in function Baseline:  01/16/23: 45%  Goal status: ONGOING 02/03/23  6.  Pt to be IND with all HEP and able to maintain and progress current LOF IND  Baseline:  Goal status: ONGOING 02/03/23  PLAN:  PT FREQUENCY: 1-2x/week  PT DURATION: 12 weeks  PLANNED INTERVENTIONS: 97164- PT Re-evaluation, 97110-Therapeutic exercises, 97530- Therapeutic activity, 97112- Neuromuscular re-education, 97535- Self Care, 02859- Manual therapy, [id number]- Aquatic Therapy, 97014- Electrical stimulation (unattended), 97016- Vasopneumatic device, Taping, Dry Needling, Joint mobilization, Scar mobilization, Cryotherapy, and Moist heat  PLAN FOR NEXT SESSION: review/ update HEP PRN. 8 weeks out at eval, shoulder mobs, AAROM, STW in the shoulder/ bicep. Continue strengthening progressing from supine to sitting reaching overhead.   Harlene Persons, PTA 02/10/23 9:18 AM Phone: [phone] Fax: [phone]

## 2023-02-21 ENCOUNTER — Ambulatory Visit: Payer: Managed Care, Other (non HMO) | Admitting: Physical Therapy

## 2023-02-24 ENCOUNTER — Encounter: Payer: Self-pay | Admitting: Physical Therapy

## 2023-02-27 ENCOUNTER — Ambulatory Visit: Payer: Managed Care, Other (non HMO) | Admitting: Physical Therapy

## 2023-03-04 ENCOUNTER — Encounter: Payer: Managed Care, Other (non HMO) | Admitting: Physical Therapy

## 2023-04-10 ENCOUNTER — Encounter: Payer: Self-pay | Admitting: Family Medicine

## 2023-04-10 MED ORDER — FLUTICASONE PROPIONATE 50 MCG/ACT NA SUSP: 2.0000 | Freq: Every day | NASAL | 2 refills | Status: AC

## 2023-04-18 ENCOUNTER — Telehealth: Payer: Self-pay | Admitting: *Deleted

## 2023-04-18 DIAGNOSIS — Z13220 Encounter for screening for lipoid disorders: Secondary | ICD-10-CM

## 2023-04-18 NOTE — Telephone Encounter (Signed)
-----   Message from Alvina Chou sent at 04/18/2023  9:58 AM EDT ----- Regarding: Lab orders for Tue, 4.1.25 Patient is scheduled for CPX labs, please order future labs, Thanks , Camelia Eng

## 2023-05-06 ENCOUNTER — Encounter: Payer: Self-pay | Admitting: Family Medicine

## 2023-05-06 ENCOUNTER — Other Ambulatory Visit (INDEPENDENT_AMBULATORY_CARE_PROVIDER_SITE_OTHER)

## 2023-05-06 DIAGNOSIS — Z13220 Encounter for screening for lipoid disorders: Secondary | ICD-10-CM | POA: Diagnosis not present

## 2023-05-06 LAB — COMPREHENSIVE METABOLIC PANEL WITH GFR
ALT: 11 U/L (ref 0–35)
AST: 17 U/L (ref 0–37)
Albumin: 4.3 g/dL (ref 3.5–5.2)
Alkaline Phosphatase: 59 U/L (ref 39–117)
BUN: 20 mg/dL (ref 6–23)
CO2: 25 meq/L (ref 19–32)
Calcium: 8.7 mg/dL (ref 8.4–10.5)
Chloride: 105 meq/L (ref 96–112)
Creatinine, Ser: 1.08 mg/dL (ref 0.40–1.20)
GFR: 61.63 mL/min (ref >60.00)
Glucose, Bld: 95 mg/dL (ref 70–99)
Potassium: 4.1 meq/L (ref 3.5–5.1)
Sodium: 138 meq/L (ref 135–145)
Total Bilirubin: 0.6 mg/dL (ref 0.2–1.2)
Total Protein: 7.1 g/dL (ref 6.0–8.3)

## 2023-05-06 LAB — LIPID PANEL
Cholesterol: 140 mg/dL (ref 0–200)
HDL: 54.3 mg/dL (ref >39.00)
LDL Cholesterol: 76 mg/dL (ref 0–99)
NonHDL: 85.91
Total CHOL/HDL Ratio: 3
Triglycerides: 51 mg/dL (ref 0.0–149.0)
VLDL: 10.2 mg/dL (ref 0.0–40.0)

## 2023-05-06 NOTE — Progress Notes (Signed)
 No critical labs need to be addressed urgently. We will discuss labs in detail at upcoming office visit.

## 2023-05-08 ENCOUNTER — Ambulatory Visit (INDEPENDENT_AMBULATORY_CARE_PROVIDER_SITE_OTHER): Admitting: Family Medicine

## 2023-05-08 VITALS — BP 122/76 | HR 84 | Temp 98.7°F | Ht 61.75 in | Wt 179.2 lb

## 2023-05-08 DIAGNOSIS — Z1211 Encounter for screening for malignant neoplasm of colon: Secondary | ICD-10-CM

## 2023-05-08 DIAGNOSIS — F3342 Major depressive disorder, recurrent, in full remission: Secondary | ICD-10-CM

## 2023-05-08 DIAGNOSIS — Z6831 Body mass index (BMI) 31.0-31.9, adult: Secondary | ICD-10-CM

## 2023-05-08 DIAGNOSIS — E6609 Other obesity due to excess calories: Secondary | ICD-10-CM

## 2023-05-08 DIAGNOSIS — Z0000 Encounter for general adult medical examination without abnormal findings: Secondary | ICD-10-CM

## 2023-05-08 DIAGNOSIS — M25512 Pain in left shoulder: Secondary | ICD-10-CM

## 2023-05-08 DIAGNOSIS — E786 Lipoprotein deficiency: Secondary | ICD-10-CM | POA: Insufficient documentation

## 2023-05-08 DIAGNOSIS — G8929 Other chronic pain: Secondary | ICD-10-CM

## 2023-05-08 DIAGNOSIS — E66811 Obesity, class 1: Secondary | ICD-10-CM

## 2023-05-08 NOTE — Assessment & Plan Note (Signed)
 Chronic, recommended continue regular exercise and heart healthy diet.

## 2023-05-08 NOTE — Progress Notes (Signed)
 Patient ID: Terry Camacho, female    DOB: [birth year], 48 y.o.   MRN: [id number]  This visit was conducted in person.  BP 122/76   Pulse 84   Temp 98.7 F (37.1 C) (Oral)   Ht 5' 1.75" (1.568 m)   Wt 179 lb 4 oz (81.3 kg)   SpO2 97%   BMI 33.05 kg/m    CC:  Chief Complaint  Patient presents with   Annual Exam    Subjective:   HPI: Terry Camacho is a 48 y.o. female presenting on 05/08/2023 for Annual Exam   Chronic shoulder pain... considering possible  Not using OTC med given not helping. Discussed consideration of change to Cymbalta or possible gabapentin.  MDD, mild:  well controlled, per pt. Frustrated with shoulder issues.  ON sertraline 50 mg daily. Flowsheet Row Office Visit from 12/26/20[facility]  PHQ-2 Total Score 4      Diet: moderate Exercise: strength training, 30  min stairmaster 2 times a week.... no upper body   Body mass index is 33.05 kg/m. Wt Readings from Last 3 Encounters:  05/08/23 179 lb 4 oz (81.3 kg)  12/26/22 181 lb 4 oz (82.2 kg)  03/29/22 178 lb (80.7 kg)    The 10-year ASCVD risk score (Arnett DK, et al., 2019) is: 0.7%   Values used to calculate the score:     Age: 23 years     Sex: Female     Is Non-Hispanic African American: Yes     Diabetic: No     Tobacco smoker: No     Systolic Blood Pressure: 122 mmHg     Is BP treated: No     HDL Cholesterol: 54.3 mg/dL     Total Cholesterol: 140 mg/dL   Reviewed labs in detail with patient. Lab Results  Component Value Date   CHOL 140 05/06/2023   HDL 54.30 05/06/2023   LDLCALC 76 05/06/2023   TRIG 51.0 05/06/2023   CHOLHDL 3 05/06/2023   The 10-year ASCVD risk score (Arnett DK, et al., 2019) is: 0.7%   Values used to calculate the score:     Age: 7 years     Sex: Female     Is Non-Hispanic African American: Yes     Diabetic: No     Tobacco smoker: No     Systolic Blood Pressure: 122 mmHg     Is BP treated: No     HDL  Cholesterol: 54.3 mg/dL     Total Cholesterol: 140 mg/dL     Right shoulder pain... improved after steroid injection with Dr. Patsy Lager.  Relevant past medical, surgical, family and social history reviewed and updated as indicated. Interim medical history since our last visit reviewed. Allergies and medications reviewed and updated. Outpatient Medications Prior to Visit  Medication Sig Dispense Refill   fluticasone (FLONASE) 50 MCG/ACT nasal spray Place 2 sprays into both nostrils daily. 16 g 2   levonorgestrel (MIRENA) 20 MCG/24HR IUD 1 each by Intrauterine route once.     sertraline (ZOLOFT) 50 MG tablet Take 1 tablet (50 mg total) by mouth daily. 30 tablet 3   diclofenac (VOLTAREN) 75 MG EC tablet Take 1 tablet (75 mg total) by mouth 2 (two) times daily. 30 tablet 0   No facility-administered medications prior to visit.     Per HPI unless specifically indicated in ROS section below Review of Systems  Constitutional:  Negative for fatigue and fever.  HENT:  Negative for congestion.   Eyes:  Negative for pain.  Respiratory:  Negative for cough and shortness of breath.   Cardiovascular:  Negative for chest pain, palpitations and leg swelling.  Gastrointestinal:  Negative for abdominal pain.  Genitourinary:  Negative for dysuria and vaginal bleeding.  Musculoskeletal:  Negative for back pain.  Neurological:  Negative for syncope, light-headedness and headaches.  Psychiatric/Behavioral:  Negative for dysphoric mood.    Objective:  BP 122/76   Pulse 84   Temp 98.7 F (37.1 C) (Oral)   Ht 5' 1.75" (1.568 m)   Wt 179 lb 4 oz (81.3 kg)   SpO2 97%   BMI 33.05 kg/m   Wt Readings from Last 3 Encounters:  05/08/23 179 lb 4 oz (81.3 kg)  12/26/22 181 lb 4 oz (82.2 kg)  03/29/22 178 lb (80.7 kg)      Physical Exam Vitals and nursing note reviewed.  Constitutional:      General: She is not in acute distress.    Appearance: Normal appearance. She is well-developed. She is not  ill-appearing or toxic-appearing.  HENT:     Head: Normocephalic.     Right Ear: Hearing, tympanic membrane, ear canal and external ear normal.     Left Ear: Hearing, tympanic membrane, ear canal and external ear normal.     Nose: Nose normal.  Eyes:     General: Lids are normal. Lids are everted, no foreign bodies appreciated.     Conjunctiva/sclera: Conjunctivae normal.     Pupils: Pupils are equal, round, and reactive to light.  Neck:     Thyroid: No thyroid mass or thyromegaly.     Vascular: No carotid bruit.     Trachea: Trachea normal.  Cardiovascular:     Rate and Rhythm: Normal rate and regular rhythm.     Heart sounds: Normal heart sounds, S1 normal and S2 normal. No murmur heard.    No gallop.  Pulmonary:     Effort: Pulmonary effort is normal. No respiratory distress.     Breath sounds: Normal breath sounds. No wheezing, rhonchi or rales.  Abdominal:     General: Bowel sounds are normal. There is no distension or abdominal bruit.     Palpations: Abdomen is soft. There is no fluid wave or mass.     Tenderness: There is no abdominal tenderness. There is no guarding or rebound.     Hernia: No hernia is present.  Musculoskeletal:     Cervical back: Normal range of motion and neck supple.  Lymphadenopathy:     Cervical: No cervical adenopathy.  Skin:    General: Skin is warm and dry.     Findings: No rash.  Neurological:     Mental Status: She is alert.     Cranial Nerves: No cranial nerve deficit.     Sensory: No sensory deficit.  Psychiatric:        Mood and Affect: Mood is not anxious or depressed.        Speech: Speech normal.        Behavior: Behavior normal. Behavior is cooperative.        Judgment: Judgment normal.       Results for orders placed or performed in visit on 05/06/23  Lipid panel   Collection Time: 05/06/23  7:46 AM  Result Value Ref Range   Cholesterol 140 0 - 200 mg/dL   Triglycerides 16.1 0.0 - 149.0 mg/dL   HDL 09.60 >45.40 mg/dL    VLDL 98.1  0.0 - 40.0 mg/dL   LDL Cholesterol 76 0 - 99 mg/dL   Total CHOL/HDL Ratio 3    NonHDL 85.91   Comprehensive metabolic panel   Collection Time: 05/06/23  7:46 AM  Result Value Ref Range   Sodium 138 135 - 145 mEq/L   Potassium 4.1 3.5 - 5.1 mEq/L   Chloride 105 96 - 112 mEq/L   CO2 25 19 - 32 mEq/L   Glucose, Bld 95 70 - 99 mg/dL   BUN 20 6 - 23 mg/dL   Creatinine, Ser 4.40 0.40 - 1.20 mg/dL   Total Bilirubin 0.6 0.2 - 1.2 mg/dL   Alkaline Phosphatase 59 39 - 117 U/L   AST 17 0 - 37 U/L   ALT 11 0 - 35 U/L   Total Protein 7.1 6.0 - 8.3 g/dL   Albumin 4.3 3.5 - 5.2 g/dL   GFR 10.27 >25.36 mL/min   Calcium 8.7 8.4 - 10.5 mg/dL     COVID 19 screen:  No recent travel or known exposure to COVID19 The patient denies respiratory symptoms of COVID 19 at this time. The importance of social distancing was discussed today.   Assessment and Plan The patient's preventative maintenance and recommended screening tests for an annual wellness exam were reviewed in full today. Brought up to date unless services declined.  Counselled on the importance of diet, exercise, and its role in overall health and mortality. The patient's FH and SH was reviewed, including their home life, tobacco status, and drug and alcohol status.   Vaccines: TDap 03/2019, refused flu today.. will get at work Pap/DVE:   Has IUD, last pap 01/2019 per GYN 04/11/21 Mammo:   05/2022,  plan repeat in 05/2043 Colon: no early family history of colon cancer.. DUE Smoking Status:nonsmoker ETOH/ drug use:  occ/none   HIV screen:   no concern.  Denies need for STD screening in addition.   Problem List Items Addressed This Visit     Chronic left shoulder pain   Chronic, minimal improvement in mobility and pain level with rotator cuff surgery.  She states prescription anti-inflammatories and over-the-counter medicines have been unhelpful. Neck step is recommended shoulder replacement which she is hesitant about. We did  briefly discuss we could consider switching SSRI to Cymbalta for chronic pain indication as well as considering a trial of gabapentin especially at night to help with pain.      Class 1 obesity without serious comorbidity with body mass index (BMI) of 31.0 to 31.9 in adult   Chronic, recommended continue regular exercise and heart healthy diet.      Low HDL (under 40)   Overall low risk of coronary artery disease, but encouraged patient to work on increasing HDL with regular exercise and exchanging animal fats for vegetable fats.      MDD (major depressive disorder), recurrent, in full remission (HCC)    Well controlled in remission.      Other Visit Diagnoses       Routine general medical examination at a health care facility    -  Primary     Colon cancer screening       Relevant Orders   Ambulatory referral to Gastroenterology           Kerby Nora, MD

## 2023-05-08 NOTE — Assessment & Plan Note (Signed)
 Chronic, minimal improvement in mobility and pain level with rotator cuff surgery.  She states prescription anti-inflammatories and over-the-counter medicines have been unhelpful. Neck step is recommended shoulder replacement which she is hesitant about. We did briefly discuss we could consider switching SSRI to Cymbalta for chronic pain indication as well as considering a trial of gabapentin especially at night to help with pain.

## 2023-05-08 NOTE — Assessment & Plan Note (Signed)
 Overall low risk of coronary artery disease, but encouraged patient to work on increasing HDL with regular exercise and exchanging animal fats for vegetable fats.

## 2023-05-08 NOTE — Assessment & Plan Note (Signed)
Well-controlled in remission

## 2023-05-27 ENCOUNTER — Encounter: Payer: Self-pay | Admitting: *Deleted

## 2023-06-02 ENCOUNTER — Other Ambulatory Visit: Payer: Self-pay | Admitting: Family Medicine

## 2023-06-02 DIAGNOSIS — Z1231 Encounter for screening mammogram for malignant neoplasm of breast: Secondary | ICD-10-CM

## 2023-06-04 ENCOUNTER — Ambulatory Visit
Admission: RE | Admit: 2023-06-04 | Discharge: 2023-06-04 | Disposition: A | Discharge status: Home / Self Care | Source: Ambulatory Visit | Attending: Family Medicine | Admitting: Family Medicine

## 2023-06-04 DIAGNOSIS — Z1231 Encounter for screening mammogram for malignant neoplasm of breast: Secondary | ICD-10-CM | POA: Insufficient documentation

## 2023-07-29 ENCOUNTER — Other Ambulatory Visit: Payer: Self-pay | Admitting: Orthopedic Surgery

## 2023-07-29 ENCOUNTER — Telehealth: Admitting: Physician Assistant

## 2023-07-29 DIAGNOSIS — J208 Acute bronchitis due to other specified organisms: Secondary | ICD-10-CM

## 2023-07-29 MED ORDER — PREDNISONE 10 MG (21) PO TBPK: ORAL_TABLET | ORAL | 0 refills | Status: DC

## 2023-07-29 MED ORDER — BENZONATATE 100 MG PO CAPS: 100.0000 mg | ORAL_CAPSULE | Freq: Three times a day (TID) | ORAL | 0 refills | Status: DC | PRN

## 2023-07-29 NOTE — Addendum Note (Signed)
 Addended by: GLADIS ELSIE BROCKS on: 07/29/2023 05:21 PM   Modules accepted: Orders

## 2023-07-29 NOTE — Progress Notes (Signed)
 I have spent 5 minutes in review of e-visit questionnaire, review and updating patient chart, medical decision making and response to patient.   Piedad Climes, PA-C

## 2023-07-29 NOTE — Progress Notes (Signed)
 E-Visit for Cough   We are sorry that you are not feeling well.  Here is how we plan to help!  Based on your presentation I believe you most likely have A cough due to a virus.  This is called viral bronchitis and is best treated by rest, plenty of fluids and control of the cough.  You may use Ibuprofen  or Tylenol  as directed to help your symptoms.     I have prescribed a short course of prednisone  to reduce airway inflammation to help coughing and to hopefully speed recovery. I have also sent in a prescription cough medication to use as directed.  From your responses in the eVisit questionnaire you describe inflammation in the upper respiratory tract which is causing a significant cough.  This is commonly called Bronchitis and has four common causes:   Allergies Viral Infections Acid Reflux Bacterial Infection Allergies, viruses and acid reflux are treated by controlling symptoms or eliminating the cause. An example might be a cough caused by taking certain blood pressure medications. You stop the cough by changing the medication. Another example might be a cough caused by acid reflux. Controlling the reflux helps control the cough.  USE OF BRONCHODILATOR (RESCUE) INHALERS: There is a risk from using your bronchodilator too frequently.  The risk is that over-reliance on a medication which only relaxes the muscles surrounding the breathing tubes can reduce the effectiveness of medications prescribed to reduce swelling and congestion of the tubes themselves.  Although you feel brief relief from the bronchodilator inhaler, your asthma may actually be worsening with the tubes becoming more swollen and filled with mucus.  This can delay other crucial treatments, such as oral steroid medications. If you need to use a bronchodilator inhaler daily, several times per day, you should discuss this with your provider.  There are probably better treatments that could be used to keep your asthma under control.      HOME CARE Only take medications as instructed by your medical team. Complete the entire course of an antibiotic. Drink plenty of fluids and get plenty of rest. Avoid close contacts especially the very young and the elderly Cover your mouth if you cough or cough into your sleeve. Always remember to wash your hands A steam or ultrasonic humidifier can help congestion.   GET HELP RIGHT AWAY IF: You develop worsening fever. You become short of breath You cough up blood. Your symptoms persist after you have completed your treatment plan MAKE SURE YOU  Understand these instructions. Will watch your condition. Will get help right away if you are not doing well or get worse.    Thank you for choosing an e-visit.  Your e-visit answers were reviewed by a board certified advanced clinical practitioner to complete your personal care plan. Depending upon the condition, your plan could have included both over the counter or prescription medications.  Please review your pharmacy choice. Make sure the pharmacy is open so you can pick up prescription now. If there is a problem, you may contact your provider through [employer] and have the prescription routed to another pharmacy.  Your safety is important to us . If you have drug allergies check your prescription carefully.   For the next 24 hours you can use MyChart to ask questions about today's visit, request a non-urgent call back, or ask for a work or school excuse. You will get an email in the next two days asking about your experience. I hope that your e-visit has been  valuable and will speed your recovery.

## 2023-08-11 ENCOUNTER — Encounter: Payer: Self-pay | Admitting: Orthopedic Surgery

## 2023-08-19 ENCOUNTER — Encounter
Admission: RE | Admit: 2023-08-19 | Discharge: 2023-08-19 | Disposition: A | Discharge status: Home / Self Care | Source: Ambulatory Visit | Attending: Orthopedic Surgery | Admitting: Orthopedic Surgery

## 2023-08-19 ENCOUNTER — Other Ambulatory Visit: Payer: Self-pay

## 2023-08-19 DIAGNOSIS — Z01812 Encounter for preprocedural laboratory examination: Secondary | ICD-10-CM | POA: Insufficient documentation

## 2023-08-19 HISTORY — DX: Other chronic pain: G89.29

## 2023-08-19 HISTORY — DX: Other obesity due to excess calories: E66.09

## 2023-08-19 HISTORY — DX: Major depressive disorder, recurrent, in full remission: F33.42

## 2023-08-19 LAB — URINALYSIS, ROUTINE W REFLEX MICROSCOPIC
Bilirubin Urine: NEGATIVE
Glucose, UA: NEGATIVE mg/dL
Hgb urine dipstick: NEGATIVE
Ketones, ur: NEGATIVE mg/dL
Leukocytes,Ua: NEGATIVE
Nitrite: NEGATIVE
Protein, ur: NEGATIVE mg/dL
Specific Gravity, Urine: 1.027 (ref 1.005–1.030)
pH: 5 (ref 5.0–8.0)

## 2023-08-19 LAB — COMPREHENSIVE METABOLIC PANEL WITH GFR
ALT: 13 U/L (ref 0–44)
AST: 19 U/L (ref 15–41)
Albumin: 4 g/dL (ref 3.5–5.0)
Alkaline Phosphatase: 60 U/L (ref 38–126)
Anion gap: 9 (ref 5–15)
BUN: 19 mg/dL (ref 6–20)
CO2: 22 mmol/L (ref 22–32)
Calcium: 8.8 mg/dL — ABNORMAL LOW (ref 8.9–10.3)
Chloride: 106 mmol/L (ref 98–111)
Creatinine, Ser: 0.81 mg/dL (ref 0.44–1.00)
GFR, Estimated: >60 mL/min (ref >60)
Glucose, Bld: 90 mg/dL (ref 70–99)
Potassium: 3.9 mmol/L (ref 3.5–5.1)
Sodium: 137 mmol/L (ref 135–145)
Total Bilirubin: 0.6 mg/dL (ref 0.0–1.2)
Total Protein: 7.1 g/dL (ref 6.5–8.1)

## 2023-08-19 LAB — CBC WITH DIFFERENTIAL/PLATELET
Abs Immature Granulocytes: 0.01 K/uL (ref 0.00–0.07)
Basophils Absolute: 0.1 K/uL (ref 0.0–0.1)
Basophils Relative: 1 %
Eosinophils Absolute: 0 K/uL (ref 0.0–0.5)
Eosinophils Relative: 1 %
HCT: 38 % (ref 36.0–46.0)
Hemoglobin: 12.2 g/dL (ref 12.0–15.0)
Immature Granulocytes: 0 %
Lymphocytes Relative: 38 %
Lymphs Abs: 2.6 K/uL (ref 0.7–4.0)
MCH: 25.7 pg — ABNORMAL LOW (ref 26.0–34.0)
MCHC: 32.1 g/dL (ref 30.0–36.0)
MCV: 80 fL (ref 80.0–100.0)
Monocytes Absolute: 0.7 K/uL (ref 0.1–1.0)
Monocytes Relative: 11 %
Neutro Abs: 3.3 K/uL (ref 1.7–7.7)
Neutrophils Relative %: 49 %
Platelets: 204 K/uL (ref 150–400)
RBC: 4.75 MIL/uL (ref 3.87–5.11)
RDW: 12.9 % (ref 11.5–15.5)
WBC: 6.7 K/uL (ref 4.0–10.5)
nRBC: 0 % (ref 0.0–0.2)

## 2023-08-19 NOTE — Patient Instructions (Signed)
 Your procedure is scheduled on: Friday 08/22/23 Report to the Registration Desk on the 1st floor of the [facility]. To find out your arrival time, please call [phone] between 1PM - 3PM on: Thursday 08/21/23 If your arrival time is 6:00 am, do not arrive before that time as the [facility] entrance doors do not open until 6:00 am.  REMEMBER: Instructions that are not followed completely may result in serious medical risk, up to and including death; or upon the discretion of your surgeon and anesthesiologist your surgery may need to be rescheduled.  Do not eat food after midnight the night before surgery.  No gum chewing or hard candies.  You may however, drink CLEAR liquids up to 2 hours before you are scheduled to arrive for your surgery. Do not drink anything within 2 hours of your scheduled arrival time.  Clear liquids include: - water  - apple juice without pulp - gatorade (not RED colors) - black coffee or tea (Do NOT add milk or creamers to the coffee or tea) Do NOT drink anything that is not on this list.   In addition, your doctor has ordered for you to drink the provided:  Ensure Pre-Surgery Clear Carbohydrate Drink  Drinking this carbohydrate drink up to two hours before surgery helps to reduce insulin resistance and improve patient outcomes. Please complete drinking 2 hours before scheduled arrival time.  One week prior to surgery: Stop Anti-inflammatories (NSAIDS) such as Advil , Aleve, Ibuprofen , Motrin , Naproxen, Naprosyn and Aspirin based products such as Excedrin, Goody's Powder, BC Powder. Stop ANY OVER THE COUNTER supplements until after surgery.  You may however, continue to take Tylenol  if needed for pain up until the day of surgery.   Continue taking all of your other prescription medications up until the day of surgery.  ON THE DAY OF SURGERY ONLY TAKE THESE MEDICATIONS WITH SIPS OF WATER:  None   Use inhalers on the day of surgery and bring to the  hospital. fluticasone  (FLONASE ) 50 MCG/ACT nasal spray   No Alcohol for 24 hours before or after surgery.  No Smoking including e-cigarettes for 24 hours before surgery.  No chewable tobacco products for at least 6 hours before surgery.  No nicotine patches on the day of surgery.  Do not use any recreational drugs for at least a week (preferably 2 weeks) before your surgery.  Please be advised that the combination of cocaine and anesthesia may have negative outcomes, up to and including death. If you test positive for cocaine, your surgery will be cancelled.  On the morning of surgery brush your teeth with toothpaste and water, you may rinse your mouth with mouthwash if you wish. Do not swallow any toothpaste or mouthwash.  Use CHG Soap or wipes as directed on instruction sheet.  Do not wear jewelry, make-up, hairpins, clips or nail polish.  For welded (permanent) jewelry: bracelets, anklets, waist bands, etc.  Please have this removed prior to surgery.  If it is not removed, there is a chance that hospital personnel will need to cut it off on the day of surgery.  Do not wear lotions, powders, or perfumes.   Do not shave body hair from the neck down 48 hours before surgery.  Contact lenses, hearing aids and dentures may not be worn into surgery.  Do not bring valuables to the hospital. [facility] is not responsible for any missing/lost belongings or valuables.    Notify your doctor if there is any change in your  medical condition (cold, fever, infection).  Wear comfortable clothing (specific to your surgery type) to the hospital.  After surgery, you can help prevent lung complications by doing breathing exercises.  Take deep breaths and cough every 1-2 hours. Your doctor may order a device called an Incentive Spirometer to help you take deep breaths.  If you are being admitted to the hospital overnight, leave your suitcase in the car. After surgery it may be brought to your  room.  In case of increased patient census, it may be necessary for you, the patient, to continue your postoperative care in the Same Day Surgery department.  If you are being discharged the day of surgery, you will not be allowed to drive home.  You will need a responsible individual to drive you home and stay with you for 24 hours after surgery.   If you are taking public transportation, you will need to have a responsible individual with you.  Please call the Pre-admissions Testing Dept. at [phone] if you have any questions about these instructions.  Surgery Visitation Policy:  Patients having surgery or a procedure may have two visitors.  Children under the age of 14 must have an adult with them who is not the patient.  Inpatient Visitation:    Visiting hours are 7 a.m. to 8 p.m. Up to four visitors are allowed at one time in a patient room. The visitors may rotate out with other people during the day.  One visitor age 81 or older may stay with the patient overnight and must be in the room by 8 p.m.   Merchandiser, retail to address health-related social needs:  [email or web address]    Preparing for Surgery with CHLORHEXIDINE  GLUCONATE (CHG) Soap  Chlorhexidine  Gluconate (CHG) Soap  o An antiseptic cleaner that kills germs and bonds with the skin to continue killing germs even after washing  o Used for showering the night before surgery and morning of surgery  Before surgery, you can play an important role by reducing the number of germs on your skin.  CHG (Chlorhexidine  gluconate) soap is an antiseptic cleanser which kills germs and bonds with the skin to continue killing germs even after washing.  Please do not use if you have an allergy to CHG or antibacterial soaps. If your skin becomes reddened/irritated stop using the CHG.  1. Shower the NIGHT BEFORE SURGERY and the MORNING OF SURGERY with CHG soap.  2. If you choose to wash your hair,  wash your hair first as usual with your normal shampoo.  3. After shampooing, rinse your hair and body thoroughly to remove the shampoo.  4. Use CHG as you would any other liquid soap. You can apply CHG directly to the skin and wash gently with a scrungie or a clean washcloth.  5. Apply the CHG soap to your body only from the neck down. Do not use on open wounds or open sores. Avoid contact with your eyes, ears, mouth, and genitals (private parts). Wash face and genitals (private parts) with your normal soap.  6. Wash thoroughly, paying special attention to the area where your surgery will be performed.  7. Thoroughly rinse your body with warm water.  8. Do not shower/wash with your normal soap after using and rinsing off the CHG soap.  9. Pat yourself dry with a clean towel.  10. Wear clean pajamas to bed the night before surgery.  12. Place clean sheets on your bed the night of your first shower and  do not sleep with pets.  13. Shower again with the CHG soap on the day of surgery prior to arriving at the hospital.  14. Do not apply any deodorants/lotions/powders.  15. Please wear clean clothes to the hospital.

## 2023-08-21 MED ORDER — CEFAZOLIN SODIUM-DEXTROSE 2-4 GM/100ML-% IV SOLN
2.0000 g | INTRAVENOUS | Status: AC
  Administered 2023-08-22: 2 g via INTRAVENOUS

## 2023-08-21 MED ORDER — CHLORHEXIDINE GLUCONATE 0.12 % MT SOLN
15.0000 mL | Freq: Once | OROMUCOSAL | Status: AC
  Administered 2023-08-22: 15 mL via OROMUCOSAL

## 2023-08-21 MED ORDER — LACTATED RINGERS IV SOLN: INTRAVENOUS | Status: DC

## 2023-08-21 MED ORDER — ORAL CARE MOUTH RINSE: 15.0000 mL | Freq: Once | OROMUCOSAL | Status: AC

## 2023-08-22 ENCOUNTER — Other Ambulatory Visit: Payer: Self-pay

## 2023-08-22 ENCOUNTER — Ambulatory Visit: Admitting: Certified Registered Nurse Anesthetist

## 2023-08-22 ENCOUNTER — Ambulatory Visit
Admission: RE | Admit: 2023-08-22 | Discharge: 2023-08-22 | Disposition: A | Discharge status: Home / Self Care | Attending: Orthopedic Surgery | Admitting: Orthopedic Surgery

## 2023-08-22 ENCOUNTER — Ambulatory Visit

## 2023-08-22 ENCOUNTER — Encounter
Admission: RE | Disposition: A | Discharge status: Home / Self Care | Payer: Self-pay | Source: Home / Self Care | Attending: Orthopedic Surgery

## 2023-08-22 ENCOUNTER — Encounter: Payer: Self-pay | Admitting: Urgent Care

## 2023-08-22 ENCOUNTER — Encounter: Payer: Self-pay | Admitting: Orthopedic Surgery

## 2023-08-22 DIAGNOSIS — K449 Diaphragmatic hernia without obstruction or gangrene: Secondary | ICD-10-CM | POA: Insufficient documentation

## 2023-08-22 DIAGNOSIS — E669 Obesity, unspecified: Secondary | ICD-10-CM | POA: Insufficient documentation

## 2023-08-22 DIAGNOSIS — M75102 Unspecified rotator cuff tear or rupture of left shoulder, not specified as traumatic: Secondary | ICD-10-CM | POA: Diagnosis not present

## 2023-08-22 DIAGNOSIS — F32A Depression, unspecified: Secondary | ICD-10-CM | POA: Diagnosis not present

## 2023-08-22 DIAGNOSIS — K219 Gastro-esophageal reflux disease without esophagitis: Secondary | ICD-10-CM | POA: Diagnosis not present

## 2023-08-22 DIAGNOSIS — M25812 Other specified joint disorders, left shoulder: Secondary | ICD-10-CM | POA: Insufficient documentation

## 2023-08-22 DIAGNOSIS — Z6831 Body mass index (BMI) 31.0-31.9, adult: Secondary | ICD-10-CM | POA: Insufficient documentation

## 2023-08-22 DIAGNOSIS — G8929 Other chronic pain: Secondary | ICD-10-CM | POA: Diagnosis not present

## 2023-08-22 DIAGNOSIS — M25519 Pain in unspecified shoulder: Secondary | ICD-10-CM | POA: Diagnosis not present

## 2023-08-22 DIAGNOSIS — M75122 Complete rotator cuff tear or rupture of left shoulder, not specified as traumatic: Secondary | ICD-10-CM | POA: Diagnosis present

## 2023-08-22 HISTORY — DX: Personal history of other diseases of the digestive system: Z87.19

## 2023-08-22 HISTORY — PX: SHOULDER ARTHROSCOPY WITH OPEN ROTATOR CUFF REPAIR: SHX6092

## 2023-08-22 HISTORY — PX: BICEPT TENODESIS: SHX5116

## 2023-08-22 HISTORY — PX: SUBACROMIAL DECOMPRESSION: SHX5174

## 2023-08-22 LAB — POCT PREGNANCY, URINE: Preg Test, Ur: NEGATIVE

## 2023-08-22 SURGERY — ARTHROSCOPY, SHOULDER WITH REPAIR, ROTATOR CUFF, OPEN
Anesthesia: Regional | Site: Shoulder | Laterality: Left

## 2023-08-22 MED ORDER — BUPIVACAINE HCL (PF) 0.5 % IJ SOLN
INTRAMUSCULAR | Status: DC | PRN
  Administered 2023-08-22: 10 mL

## 2023-08-22 MED ORDER — ACETAMINOPHEN 500 MG PO TABS: 1000.0000 mg | ORAL_TABLET | Freq: Three times a day (TID) | ORAL | 2 refills | Status: DC

## 2023-08-22 MED ORDER — BUPIVACAINE HCL (PF) 0.5 % IJ SOLN
INTRAMUSCULAR | Status: AC
Start: 2023-08-22 — End: 2023-08-22
  Filled 2023-08-22: qty 10

## 2023-08-22 MED ORDER — DEXAMETHASONE SODIUM PHOSPHATE 10 MG/ML IJ SOLN
INTRAMUSCULAR | Status: DC | PRN
  Administered 2023-08-22: 10 mg via INTRAVENOUS

## 2023-08-22 MED ORDER — LIDOCAINE HCL (CARDIAC) PF 100 MG/5ML IV SOSY
PREFILLED_SYRINGE | INTRAVENOUS | Status: DC | PRN
  Administered 2023-08-22: 100 mg via INTRAVENOUS

## 2023-08-22 MED ORDER — ONDANSETRON HCL 4 MG/2ML IJ SOLN
INTRAMUSCULAR | Status: AC
Start: 2023-08-22 — End: 2023-08-22
  Filled 2023-08-22: qty 2

## 2023-08-22 MED ORDER — SUGAMMADEX SODIUM 200 MG/2ML IV SOLN
INTRAVENOUS | Status: DC | PRN
  Administered 2023-08-22: 157.8 mg via INTRAVENOUS

## 2023-08-22 MED ORDER — ACETAMINOPHEN 10 MG/ML IV SOLN
INTRAVENOUS | Status: AC
Start: 2023-08-22 — End: 2023-08-22
  Filled 2023-08-22: qty 100

## 2023-08-22 MED ORDER — FENTANYL CITRATE (PF) 100 MCG/2ML IJ SOLN
INTRAMUSCULAR | Status: DC | PRN
  Administered 2023-08-22: 50 ug via INTRAVENOUS

## 2023-08-22 MED ORDER — OXYCODONE HCL 5 MG PO TABS: 5.0000 mg | ORAL_TABLET | Freq: Once | ORAL | Status: DC | PRN

## 2023-08-22 MED ORDER — MIDAZOLAM HCL 2 MG/2ML IJ SOLN
1.0000 mg | INTRAMUSCULAR | Status: DC | PRN
  Administered 2023-08-22: 2 mg via INTRAVENOUS

## 2023-08-22 MED ORDER — PROPOFOL 10 MG/ML IV BOLUS
INTRAVENOUS | Status: DC | PRN
  Administered 2023-08-22: 150 mg via INTRAVENOUS

## 2023-08-22 MED ORDER — LACTATED RINGERS IV SOLN: INTRAVENOUS | Status: DC

## 2023-08-22 MED ORDER — ROCURONIUM BROMIDE 100 MG/10ML IV SOLN
INTRAVENOUS | Status: DC | PRN
  Administered 2023-08-22: 20 mg via INTRAVENOUS
  Administered 2023-08-22: 40 mg via INTRAVENOUS
  Administered 2023-08-22: 20 mg via INTRAVENOUS
  Administered 2023-08-22: 10 mg via INTRAVENOUS

## 2023-08-22 MED ORDER — DROPERIDOL 2.5 MG/ML IJ SOLN
INTRAMUSCULAR | Status: AC
Start: 2023-08-22 — End: 2023-08-22
  Filled 2023-08-22: qty 2

## 2023-08-22 MED ORDER — BUPIVACAINE LIPOSOME 1.3 % IJ SUSP
INTRAMUSCULAR | Status: DC | PRN
Start: 2023-08-22 — End: 2023-08-22
  Administered 2023-08-22: 20 mL

## 2023-08-22 MED ORDER — ACETAMINOPHEN 10 MG/ML IV SOLN
INTRAVENOUS | Status: DC | PRN
  Administered 2023-08-22: 1000 mg via INTRAVENOUS

## 2023-08-22 MED ORDER — DROPERIDOL 2.5 MG/ML IJ SOLN
0.6250 mg | Freq: Once | INTRAMUSCULAR | Status: AC
  Administered 2023-08-22: 0.625 mg via INTRAVENOUS

## 2023-08-22 MED ORDER — EPINEPHRINE PF 1 MG/ML IJ SOLN
INTRAMUSCULAR | Status: AC
  Filled 2023-08-22: qty 2

## 2023-08-22 MED ORDER — ONDANSETRON HCL 4 MG/2ML IJ SOLN
4.0000 mg | Freq: Once | INTRAMUSCULAR | Status: AC | PRN
  Administered 2023-08-22: 4 mg via INTRAVENOUS

## 2023-08-22 MED ORDER — MIDAZOLAM HCL 2 MG/2ML IJ SOLN
INTRAMUSCULAR | Status: AC
  Filled 2023-08-22: qty 2

## 2023-08-22 MED ORDER — ONDANSETRON HCL 4 MG/2ML IJ SOLN
INTRAMUSCULAR | Status: AC
  Filled 2023-08-22: qty 2

## 2023-08-22 MED ORDER — ROCURONIUM BROMIDE 10 MG/ML (PF) SYRINGE
PREFILLED_SYRINGE | INTRAVENOUS | Status: AC
  Filled 2023-08-22: qty 10

## 2023-08-22 MED ORDER — PHENYLEPHRINE HCL-NACL 20-0.9 MG/250ML-% IV SOLN
INTRAVENOUS | Status: AC
Start: 2023-08-22 — End: 2023-08-22
  Filled 2023-08-22: qty 250

## 2023-08-22 MED ORDER — ACETAMINOPHEN 10 MG/ML IV SOLN
1000.0000 mg | Freq: Once | INTRAVENOUS | Status: DC | PRN
Start: 2023-08-22 — End: 2023-08-22

## 2023-08-22 MED ORDER — DEXAMETHASONE SODIUM PHOSPHATE 10 MG/ML IJ SOLN
INTRAMUSCULAR | Status: AC
  Filled 2023-08-22: qty 1

## 2023-08-22 MED ORDER — DEXMEDETOMIDINE HCL IN NACL 80 MCG/20ML IV SOLN
INTRAVENOUS | Status: DC | PRN
  Administered 2023-08-22: 4 ug via INTRAVENOUS
  Administered 2023-08-22: 8 ug via INTRAVENOUS

## 2023-08-22 MED ORDER — OXYCODONE HCL 5 MG/5ML PO SOLN: 5.0000 mg | Freq: Once | ORAL | Status: DC | PRN

## 2023-08-22 MED ORDER — PHENYLEPHRINE HCL-NACL 20-0.9 MG/250ML-% IV SOLN
INTRAVENOUS | Status: DC | PRN
  Administered 2023-08-22: 35 ug/min via INTRAVENOUS

## 2023-08-22 MED ORDER — BUPIVACAINE LIPOSOME 1.3 % IJ SUSP
INTRAMUSCULAR | Status: AC
  Filled 2023-08-22: qty 20

## 2023-08-22 MED ORDER — FENTANYL CITRATE PF 50 MCG/ML IJ SOSY
PREFILLED_SYRINGE | INTRAMUSCULAR | Status: AC
Start: 2023-08-22 — End: 2023-08-22
  Filled 2023-08-22: qty 1

## 2023-08-22 MED ORDER — FENTANYL CITRATE PF 50 MCG/ML IJ SOSY
PREFILLED_SYRINGE | INTRAMUSCULAR | Status: AC
  Filled 2023-08-22: qty 1

## 2023-08-22 MED ORDER — ONDANSETRON 4 MG PO TBDP: 4.0000 mg | ORAL_TABLET | Freq: Three times a day (TID) | ORAL | 0 refills | Status: DC | PRN

## 2023-08-22 MED ORDER — CHLORHEXIDINE GLUCONATE 0.12 % MT SOLN
OROMUCOSAL | Status: AC
  Filled 2023-08-22: qty 15

## 2023-08-22 MED ORDER — RINGERS IRRIGATION IR SOLN
Status: DC | PRN
  Administered 2023-08-22 (×3): 3000 mL

## 2023-08-22 MED ORDER — ONDANSETRON HCL 4 MG/2ML IJ SOLN
INTRAMUSCULAR | Status: DC | PRN
  Administered 2023-08-22: 4 mg via INTRAVENOUS

## 2023-08-22 MED ORDER — FENTANYL CITRATE PF 50 MCG/ML IJ SOSY
50.0000 ug | PREFILLED_SYRINGE | Freq: Once | INTRAMUSCULAR | Status: AC
  Administered 2023-08-22: 50 ug via INTRAVENOUS

## 2023-08-22 MED ORDER — CEFAZOLIN SODIUM-DEXTROSE 2-4 GM/100ML-% IV SOLN
INTRAVENOUS | Status: AC
Start: 2023-08-22 — End: 2023-08-22
  Filled 2023-08-22: qty 100

## 2023-08-22 MED ORDER — OXYCODONE HCL 5 MG PO TABS: 5.0000 mg | ORAL_TABLET | ORAL | 0 refills | Status: DC | PRN

## 2023-08-22 MED ORDER — FENTANYL CITRATE (PF) 100 MCG/2ML IJ SOLN: 25.0000 ug | INTRAMUSCULAR | Status: DC | PRN

## 2023-08-22 MED ORDER — ASPIRIN 325 MG PO TBEC: 325.0000 mg | DELAYED_RELEASE_TABLET | Freq: Every day | ORAL | 0 refills | Status: AC

## 2023-08-22 SURGICAL SUPPLY — 68 items
ADAPTER IRRIG TUBE 2 SPIKE SOL (ADAPTER) ×4 IMPLANT
ANCHOR BONE REGENETEN (Anchor) IMPLANT
ANCHOR TENDON REGENETEN (Staple) IMPLANT
BLADE SHAVER 4.5X7 STR FR (MISCELLANEOUS) ×2 IMPLANT
BNDG ADH 2 X3.75 FABRIC TAN LF (GAUZE/BANDAGES/DRESSINGS) ×2 IMPLANT
BUR ACROMIONIZER 4.0 (BURR) IMPLANT
BUR BR 5.5 WIDE MOUTH (BURR) ×2 IMPLANT
CANNULA PART THRD DISP 5.75X7 (CANNULA) ×2 IMPLANT
CANNULA PARTIAL THREAD 2X7 (CANNULA) IMPLANT
CANNULA TWIST IN 8.25X7CM (CANNULA) IMPLANT
CANNULA TWIST IN 8.25X9CM (CANNULA) IMPLANT
CHLORAPREP W/TINT 26 (MISCELLANEOUS) ×2 IMPLANT
COOLER POLAR GLACIER W/PUMP (MISCELLANEOUS) ×2 IMPLANT
COVER LIGHT HANDLE STERIS (MISCELLANEOUS) IMPLANT
DERMABOND ADVANCED .7 DNX12 (GAUZE/BANDAGES/DRESSINGS) IMPLANT
DRAPE INCISE IOBAN 66X45 STRL (DRAPES) ×2 IMPLANT
DRAPE SHEET LG 3/4 BI-LAMINATE (DRAPES) ×2 IMPLANT
DRSG TEGADERM 2-3/8X2-3/4 SM (GAUZE/BANDAGES/DRESSINGS) IMPLANT
DRSG TEGADERM 4X4.75 (GAUZE/BANDAGES/DRESSINGS) ×6 IMPLANT
ELECTRODE REM PT RTRN 9FT ADLT (ELECTROSURGICAL) ×2 IMPLANT
GAUZE SPONGE 4X4 12PLY STRL (GAUZE/BANDAGES/DRESSINGS) ×2 IMPLANT
GAUZE XEROFORM 1X8 LF (GAUZE/BANDAGES/DRESSINGS) ×2 IMPLANT
GLOVE BIO SURGEON STRL SZ7.5 (GLOVE) ×2 IMPLANT
GLOVE BIOGEL PI IND STRL 8 (GLOVE) ×4 IMPLANT
GLOVE SURG ORTHO 8.0 STRL STRW (GLOVE) ×2 IMPLANT
GLOVE SURG SYN 8.0 PF PI (GLOVE) ×2 IMPLANT
GOWN STRL REUS W/ TWL LRG LVL3 (GOWN DISPOSABLE) ×4 IMPLANT
GOWN STRL REUS W/TWL XL LVL4 (GOWN DISPOSABLE) ×2 IMPLANT
IMPL REGENETEN LRG (Shoulder) IMPLANT
IV LR IRRIG 3000ML ARTHROMATIC (IV SOLUTION) ×8 IMPLANT
KIT CORKSCREW KNTLS 3.9 S/T/P (INSTRUMENTS) IMPLANT
KIT STABILIZATION SHOULDER (MISCELLANEOUS) ×2 IMPLANT
KIT SUTURETAK 3.0 INSERT PERC (KITS) IMPLANT
KIT TURNOVER KIT A (KITS) ×2 IMPLANT
MANIFOLD NEPTUNE II (INSTRUMENTS) ×4 IMPLANT
MASK FACE SPIDER DISP (MASK) ×2 IMPLANT
MAT ABSORB FLUID 56X50 GRAY (MISCELLANEOUS) ×4 IMPLANT
NDL SAFETY ECLIPSE 18X1.5 (NEEDLE) ×2 IMPLANT
NDL SCORPION MULTI FIRE (NEEDLE) IMPLANT
NDL SUT 5 .5 CRC TPR PNT MAYO (NEEDLE) IMPLANT
NEEDLE SCORPION MULTI FIRE (NEEDLE) IMPLANT
PACK ARTHROSCOPY SHOULDER (MISCELLANEOUS) ×2 IMPLANT
PAD ABD DERMACEA PRESS 5X9 (GAUZE/BANDAGES/DRESSINGS) ×6 IMPLANT
PAD ARMBOARD POSITIONER FOAM (MISCELLANEOUS) ×6 IMPLANT
PAD WRAPON POLAR SHDR XLG (MISCELLANEOUS) ×2 IMPLANT
PASSER SUT FIRSTPASS SELF (INSTRUMENTS) IMPLANT
PASSER SUT SWIFTSTITCH HIP CRT (INSTRUMENTS) IMPLANT
SLING ULTRA II M (MISCELLANEOUS) IMPLANT
SPONGE T-LAP 18X18 ~~LOC~~+RFID (SPONGE) ×2 IMPLANT
STAPLER SKIN PROX 35W (STAPLE) IMPLANT
STRAP SAFETY 5IN WIDE (MISCELLANEOUS) ×2 IMPLANT
SUT ETHILON 3-0 (SUTURE) IMPLANT
SUT LASSO 90 DEG CVD (SUTURE) IMPLANT
SUT LASSO 90 DEG SD STR (SUTURE) IMPLANT
SUT PROLENE 0 CT 2 (SUTURE) IMPLANT
SUT VIC AB 0 CT1 36 (SUTURE) IMPLANT
SUT VIC AB 2-0 CT2 27 (SUTURE) IMPLANT
SUTURE MNCRL 4-0 27XMF (SUTURE) IMPLANT
SUTURE TAPE 1.3 40 TPR END (SUTURE) IMPLANT
SYSTEM IMPL TENODESIS LNT 2.9 (Orthopedic Implant) IMPLANT
TAPE CLOTH 3X10 WHT NS LF (GAUZE/BANDAGES/DRESSINGS) ×2 IMPLANT
TAPE MICROFOAM 4IN (TAPE) IMPLANT
TRAP FLUID SMOKE EVACUATOR (MISCELLANEOUS) ×2 IMPLANT
TUBE SET DOUBLEFLO INFLOW (TUBING) ×2 IMPLANT
TUBE SET DOUBLEFLO OUTFLOW (TUBING) ×2 IMPLANT
TUBING CONNECTING 10 (TUBING) IMPLANT
WAND WEREWOLF FLOW 90D (MISCELLANEOUS) ×2 IMPLANT
WATER STERILE IRR 500ML POUR (IV SOLUTION) ×2 IMPLANT

## 2023-08-22 NOTE — Transfer of Care (Signed)
 Immediate Anesthesia Transfer of Care Note  Patient: Terry Camacho  Procedure(s) Performed: ARTHROSCOPY, SHOULDER WITH REPAIR, ROTATOR CUFF, OPEN (Left: Shoulder) DECOMPRESSION, SUBACROMIAL SPACE (Left: Shoulder) TENODESIS, BICEPS (Left: Shoulder)  Patient Location: PACU  Anesthesia Type:General  Level of Consciousness: awake and alert   Airway & Oxygen Therapy: Patient Spontanous Breathing and Patient connected to nasal cannula oxygen  Post-op Assessment: Report given to RN and Post -op Vital signs reviewed and stable  Post vital signs: Reviewed and stable  Last Vitals:  Vitals Value Taken Time  BP 108/73 08/22/23 14:06  Temp    Pulse 72 08/22/23 14:09  Resp 27 08/22/23 14:09  SpO2 100 % 08/22/23 14:09  Vitals shown include unfiled device data.  Last Pain:  Vitals:   08/22/23 0911  PainSc: 6       Patients Stated Pain Goal: 1 (08/22/23 0911)  Complications: No notable events documented.

## 2023-08-22 NOTE — Anesthesia Preprocedure Evaluation (Addendum)
 Anesthesia Evaluation  Patient identified by MRN, date of birth, ID band Patient awake    Reviewed: Allergy & Precautions, NPO status , Patient's Chart, lab work & pertinent test results  History of Anesthesia Complications Negative for: history of anesthetic complications  Airway Mallampati: II   Neck ROM: Full    Dental no notable dental hx.    Pulmonary neg pulmonary ROS   Pulmonary exam normal breath sounds clear to auscultation       Cardiovascular Exercise Tolerance: Good negative cardio ROS Normal cardiovascular exam Rhythm:Regular Rate:Normal     Neuro/Psych  PSYCHIATRIC DISORDERS  Depression    Chronic shoulder pain    GI/Hepatic hiatal hernia,GERD  ,,  Endo/Other  Obesity   Renal/GU negative Renal ROS     Musculoskeletal   Abdominal   Peds  Hematology negative hematology ROS (+)   Anesthesia Other Findings   Reproductive/Obstetrics                              Anesthesia Physical Anesthesia Plan  ASA: 2  Anesthesia Plan: General and Regional   Post-op Pain Management: Regional block*   Induction: Intravenous  PONV Risk Score and Plan: 3 and Ondansetron, Dexamethasone and Treatment may vary due to age or medical condition  Airway Management Planned: Oral ETT  Additional Equipment:   Intra-op Plan:   Post-operative Plan: Extubation in OR  Informed Consent: I have reviewed the patients History and Physical, chart, labs and discussed the procedure including the risks, benefits and alternatives for the proposed anesthesia with the patient or authorized representative who has indicated his/her understanding and acceptance.     Dental advisory given  Plan Discussed with: CRNA  Anesthesia Plan Comments: (Plan for preoperative interscalene nerve block and GETA. Patient consented for risks of anesthesia including but not limited to:  - adverse reactions to  medications - damage to eyes, teeth, lips or other oral mucosa - nerve damage due to positioning  - sore throat or hoarseness - damage to heart, brain, nerves, lungs, other parts of body or loss of life  Informed patient about role of CRNA in peri- and intra-operative care.  Patient voiced understanding.)         Anesthesia Quick Evaluation

## 2023-08-22 NOTE — Anesthesia Postprocedure Evaluation (Signed)
 Anesthesia Post Note  Patient: Terry Camacho  Procedure(s) Performed: ARTHROSCOPY, SHOULDER WITH REPAIR, ROTATOR CUFF, OPEN (Left: Shoulder) DECOMPRESSION, SUBACROMIAL SPACE (Left: Shoulder) TENODESIS, BICEPS (Left: Shoulder)  Patient location during evaluation: PACU Anesthesia Type: Regional Level of consciousness: awake and alert, oriented and patient cooperative Pain management: pain level controlled Vital Signs Assessment: post-procedure vital signs reviewed and stable Respiratory status: spontaneous breathing, nonlabored ventilation and respiratory function stable Cardiovascular status: blood pressure returned to baseline and stable Postop Assessment: adequate PO intake Anesthetic complications: no   No notable events documented.   Last Vitals:  Vitals:   08/22/23 1510 08/22/23 1515  BP:  109/77  Pulse: 69 (!) 57  Resp: (!) 22 16  Temp: (!) 36.1 C   SpO2: 99% 99%    Last Pain:  Vitals:   08/22/23 1510  PainSc: 0-No pain                 Alfonso Ruths

## 2023-08-22 NOTE — H&P (Signed)
 Paper H&P to be scanned into permanent record. H&P reviewed. No significant changes noted.

## 2023-08-22 NOTE — Anesthesia Procedure Notes (Signed)
 Anesthesia Regional Block: Interscalene brachial plexus block   Pre-Anesthetic Checklist: , timeout performed,  Correct Patient, Correct Site, Correct Laterality,  Correct Procedure,, risks and benefits discussed,  Surgical consent,  Pre-op evaluation,  At surgeon's request and post-op pain management  Laterality: Left  Prep: chloraprep       Needles:  Injection technique: Single-shot  Needle Type: Echogenic Needle          Additional Needles:   Procedures:,,,, ultrasound used (permanent image in chart),,   Motor weakness within 20 minutes.  Narrative:  Start time: 08/22/2023 9:56 AM End time: 08/22/2023 9:58 AM Injection made incrementally with aspirations every 5 mL.  Performed by: Personally  Anesthesiologist: Shellie Odor, MD  Additional Notes: Functioning IV was confirmed and monitors applied.  Sterile prep and drape, hand hygiene and sterile gloves were used. Ultrasound guidance: relevant anatomy identified, needle position confirmed, local anesthetic spread visualized around nerve(s), vascular puncture avoided.  Image saved to electronic medical record.  Negative aspiration prior to incremental administration of local anesthetic for total 20 ml Exparel and 10 ml bupivacaine 0.5% given in interscalene distribution. The patient tolerated the procedure well. Vital signs and moderate sedation medications recorded in RN notes.

## 2023-08-22 NOTE — Discharge Instructions (Signed)
 Post-Op Instructions - Regeneten Patch  1. Bracing: You will wear a shoulder immobilizer or sling for 2 weeks  2. Driving: No driving for at least 2 weeks post-op.   3. Activity: Progress to motion as tolerated, moving from passive to active-assisted to active motion. For the first 4 weeks, forward flexion is limited to 100. External rotation with the arm by the side is allowed, but abduction-external rotation is not allowed for the first 6 weeks. After 6 weeks, no restrictions on motion or arm use. Return to normal activities normally takes 4-6 months on average. If rehab goes very well, may be able to do most activities at 4 months, except overhead or contact sports.  4. Physical Therapy: Begins 3-4 days after surgery  5. Medications:  - You will be provided a prescription for narcotic pain medicine. After surgery, take 1-2 narcotic tablets every 4 hours if needed for severe pain.  - A prescription for anti-nausea medication will be provided in case the narcotic medicine causes nausea - take 1 tablet every 6 hours only if nauseated.   - Take tylenol  1000 mg (2 Extra Strength tablets or 3 regular strength) every 8 hours for pain.  May decrease or stop tylenol  5 days after surgery if you are having minimal pain. - Take ASA 325mg /day x 2 weeks to help prevent DVTs/PEs (blood clots).  - DO NOT take ANY nonsteroidal anti-inflammatory pain medications (Advil , Motrin , Ibuprofen , Aleve, Naproxen, or Naprosyn). These medicines can inhibit healing of your shoulder repair.    If you are taking prescription medication for anxiety, depression, insomnia, muscle spasm, chronic pain, or for attention deficit disorder, you are advised that you are at a higher risk of adverse effects with use of narcotics post-op, including narcotic addiction/dependence, depressed breathing, death. If you use non-prescribed substances: alcohol, marijuana, cocaine, heroin, methamphetamines, etc., you are at a higher risk of  adverse effects with use of narcotics post-op, including narcotic addiction/dependence, depressed breathing, death. You are advised that taking > 50 morphine milligram equivalents (MME) of narcotic pain medication per day results in twice the risk of overdose or death. For your prescription provided: oxycodone 5 mg - taking more than 6 tablets per day would result in > 50 morphine milligram equivalents (MME) of narcotic pain medication. Be advised that we will prescribe narcotics short-term, for acute post-operative pain only - 3 weeks for major operations such as shoulder repair/reconstruction surgeries.     6. Post-Op Appointment:  Your first post-op appointment will be 10-14 days post-op.  7. Work or School: For most, but not all procedures, we advise staying out of work or school for at least 1 to 2 weeks in order to recover from the stress of surgery and to allow time for healing.   If you need a work or school note this can be provided.   8. Smoking: If you are a smoker, you need to refrain from smoking in the postoperative period. The nicotine in cigarettes will inhibit healing of your shoulder repair and decrease the chance of successful repair. Similarly, nicotine containing products (gum, patches) should be avoided.   Post-operative Brace: Apply and remove the brace you received as you were instructed to at the time of fitting and as described in detail as the brace's instructions for use indicate.  Wear the brace for the period of time prescribed by your physician.  The brace can be cleaned with soap and water and allowed to air dry only.  Should the brace result  in increased pain, decreased feeling (numbness/tingling), increased swelling or an overall worsening of your medical condition, please contact your doctor immediately.  If an emergency situation occurs as a result of wearing the brace after normal business hours, please dial 911 and seek immediate medical attention.  Let your  doctor know if you have any further questions about the brace issued to you. Refer to the shoulder sling instructions for use if you have any questions regarding the correct fit of your shoulder sling.  [facility] Customer Care for Troubleshooting: [phone]  Video that illustrates how to properly use a shoulder sling: Instructions for Proper Use of an Orthopaedic Sling http://bass.com/

## 2023-08-22 NOTE — Anesthesia Procedure Notes (Signed)
 Procedure Name: Intubation Date/Time: 08/22/2023 11:35 AM  Performed by: Duwayne Craven, CRNAPre-anesthesia Checklist: Patient identified, Patient being monitored, Timeout performed, Emergency Drugs available and Suction available Patient Re-evaluated:Patient Re-evaluated prior to induction Oxygen Delivery Method: Circle system utilized Preoxygenation: Pre-oxygenation with 100% oxygen Induction Type: IV induction Ventilation: Mask ventilation without difficulty Laryngoscope Size: 3 and McGrath Grade View: Grade I Tube type: Oral Tube size: 7.0 mm Number of attempts: 1 Airway Equipment and Method: Stylet Placement Confirmation: ETT inserted through vocal cords under direct vision, positive ETCO2 and breath sounds checked- equal and bilateral Secured at: 22 cm Tube secured with: Tape Dental Injury: Teeth and Oropharynx as per pre-operative assessment

## 2023-08-22 NOTE — Op Note (Signed)
 SURGERY DATE: 08/22/2023   PRE-OP DIAGNOSIS:  1. Left rotator cuff tear (supraspinatus) 2. Left biceps tendinopathy 3. Left subacromial impingement  POST-OP DIAGNOSIS: 1. Left rotator cuff tear (supraspinatus) 2. Left biceps tendinopathy 3. Left subacromial impingement  PROCEDURES:  1. Left arthroscopic rotator cuff repair with Regeneten patch augmentation 2. Left arthroscopic biceps tenodesis 3. Left extensive debridement of shoulder (glenohumeral and subacromial spaces) 4. Left arthroscopic subacromial decompression  SURGEON: Earnestine HILARIO Blanch, MD  ASSISTANT: DOROTHA Krystal Doyne, PA  ANESTHESIA: Gen with interscalene block w/Exparel  ESTIMATED BLOOD LOSS: 5cc  DRAINS:  none  TOTAL IV FLUIDS: per anesthesia   SPECIMENS: none  IMPLANTS:  - [employer] Regeneten patch with associated tendon and bone staples - Arthrex 2.45mm PushLock anchor (arthroscopic biceps tenodesis)  OPERATIVE FINDINGS:  Examination under anesthesia: A careful examination under anesthesia was performed.  Passive range of motion was: FF: 150; ER at side: 50; ER in abduction: 90; IR in abduction: 45.  Anterior load shift: NT.  Posterior load shift: NT.  Sulcus in neutral: NT.  Sulcus in ER: NT.    Intra-operative findings: A thorough arthroscopic examination of the shoulder was performed.  The findings are: 1. Biceps tendon: Significant tendinopathy with erythema 2. Superior labrum: Degenerative fraying 3. Posterior labrum and capsule: normal 4. Inferior capsule and inferior recess: normal 5. Glenoid cartilage surface: Normal 6. Supraspinatus attachment: intact articular and bursal portion with thin tissue overlying the footprint. There or also 2 longitudinal splits just medial to the articular margin in the region of prior repair suture 7. Posterior rotator cuff attachment: normal  8. Humeral head articular cartilage: normal 9. Rotator interval: Synovitic 10: Subscapularis tendon: Normal 11. Anterior  labrum: degenerative tearing 12. IGHL: normal  OPERATIVE REPORT:   Indications for procedure: Terry Camacho is a 48 y.o. female who initially underwent left arthroscopic rotator cuff repair in [place] on 09/25/2022.  She had continued pain and weakness postoperatively despite extensive physical therapy repeat MRI showed potential rotator cuff re- tear with biceps tendinopathy. After discussion of risks, benefits, and alternatives to surgery, the patient elected to proceed with above mentioned procedure.  Preoperatively, the patient consented to the use of allograft and xenograft tissue as needed.  Procedure in detail:  I identified Terry Camacho in the pre-operative holding area.  I marked the operative shoulder with my initials. I reviewed the risks and benefits of the proposed surgical intervention, and the patient wished to proceed.  Anesthesia was then performed with an interscalene block with Exparel.  The patient was transferred to the operative suite and placed in the beach chair position.    SCDs were placed on the lower extremities. Appropriate IV antibiotics were administered. The operative upper extremity was then prepped and draped in standard fashion. A time out was performed confirming the correct extremity, correct patient and correct procedure.   I then created a standard posterior portal with an 11 blade. The glenohumeral joint was easily entered with a blunt trochar and the arthroscope introduced. The findings of diagnostic arthroscopy are described above. I debrided the degenerative anterior labrum, superior labrum, and also debrided and coagulated the inflamed synovium about the rotator interval to obtain hemostasis and reduce the risk of post-operative swelling using an Arthrocare radiofrequency device.    I then turned my attention to the arthroscopic biceps tenodesis.  I used the loop n tack technique to pass a FiberTape through the biceps in a locked fashion adjacent  to the biceps anchor.  The biceps  tendon was cut at its attachment on the superior labrum.  The remnant stump was debrided down to a stable base on the biceps anchor complex using an oscillating shaver.  A hole for a 2.9 mm Arthrex PushLock was drilled in the bicipital groove just superior to the subscapularis tendon insertion.  The fiber tape was loaded onto the PushLock anchor and impacted into place into the previously drilled hole in the bicipital groove.  This appropriately secured the biceps into the bicipital groove and took it off of tension.  Next, the arthroscope was then introduced into the subacromial space. A direct lateral portal was created with an 11-blade after spinal needle localization.  There was severe bursitis.  An extensive subacromial bursectomy was performed using a combination of the shaver and Arthrocare wand. The entire acromial undersurface was exposed and the CA ligament was subperiosteally elevated to expose the prominent anterior acromial hook. A burr was used to create a flat anterior and lateral aspect of the acromion, converting it from a Type 2 to a Type 1 acromion. Care was made to keep the deltoid fascia intact.  Next, adjacent to the FiberTapes from the prior repair, there were longitudinal splits in the supraspinatus.  These were located just medial to the articular margin.  A side-to-side stitch using suture tape was passed across each of these full-thickness defects.  Arthroscopic knots were tied and suture was cut.  This nicely reapproximated the rotator cuff and closed those defects.  Given the thin tissue overlying the rotator cuff footprint, decision was made to perform an augmentation with a Regeneten patch.   The Regeneten patch delivery gun was placed appropriately and the patch was delivered over the supraspinatus tendon.  It was positioned such the medial border of the patch was medial to repair stitches. Tendon staples were placed medially, anteriorly, and  posteriorly after making appropriate stab incisions for portal placement.  3 bone staples were then placed laterally. The patch was then probed to confirm appropriate stability.   Arthroscopic fluid was evacuated from the joint.  The portals were closed with 3-0 Nylon. Xeroform was applied to the portals. A sterile dressing was applied, followed by a Polar Care sleeve and a SlingShot shoulder immobilizer/sling. The patient awoke from anesthesia without difficulty and was transferred to the PACU in stable condition.    Of note, assistance from a PA was essential to performing the surgery.  PA was present for the entire surgery.  PA assisted with patient positioning, retraction, instrumentation, and wound closure. The surgery would have been more difficult and had longer operative time without PA assistance.   COMPLICATIONS: none  DISPOSITION: plan for discharge home after recovery in PACU  POSTOPERATIVE PLAN: Remain in sling (except hygiene and elbow/wrist/hand RoM exercises as instructed by PT) x 2 weeks and NWB for this time. Wean from sling as tolerated after 2 weeks.  PT to begin 3-4 days after surgery. Use Regeneten Patch rehab protocol: For the first 4 weeks, forward flexion is limited to 100. External rotation with the arm by the side is allowed, but abduction-external rotation is not allowed for the first 6 weeks. After 6 weeks, no restrictions on motion or arm use.  ASA for DVT prophylaxis.  Follow-up as an outpatient as scheduled in approximately 2 weeks.

## 2023-08-25 ENCOUNTER — Encounter: Payer: Self-pay | Admitting: Orthopedic Surgery

## 2023-09-03 ENCOUNTER — Encounter: Payer: Self-pay | Admitting: Orthopedic Surgery

## 2024-02-03 ENCOUNTER — Ambulatory Visit
Admission: RE | Admit: 2024-02-03 | Discharge: 2024-02-03 | Disposition: A | Discharge status: Home / Self Care | Attending: Emergency Medicine | Admitting: Emergency Medicine

## 2024-02-03 VITALS — BP 118/81 | HR 100 | Temp 98.9°F | Resp 16

## 2024-02-03 DIAGNOSIS — R051 Acute cough: Secondary | ICD-10-CM | POA: Insufficient documentation

## 2024-02-03 DIAGNOSIS — J22 Unspecified acute lower respiratory infection: Secondary | ICD-10-CM | POA: Diagnosis not present

## 2024-02-03 MED ORDER — DOXYCYCLINE HYCLATE 100 MG PO CAPS: 100.0000 mg | ORAL_CAPSULE | Freq: Two times a day (BID) | ORAL | 0 refills | Status: AC

## 2024-02-03 MED ORDER — PROMETHAZINE-DM 6.25-15 MG/5ML PO SYRP: 5.0000 mL | ORAL_SOLUTION | Freq: Four times a day (QID) | ORAL | 0 refills | Status: AC | PRN

## 2024-02-03 MED ORDER — DOXYCYCLINE HYCLATE 100 MG PO CAPS: 100.0000 mg | ORAL_CAPSULE | Freq: Two times a day (BID) | ORAL | 0 refills | Status: DC

## 2024-02-03 MED ORDER — PROMETHAZINE-DM 6.25-15 MG/5ML PO SYRP: 5.0000 mL | ORAL_SOLUTION | Freq: Four times a day (QID) | ORAL | 0 refills | Status: DC | PRN

## 2024-02-03 NOTE — ED Triage Notes (Addendum)
 Pt c/o cough (started 12/22), sore throat (stared 12/26), clear eye drainage, crusting to eyes and redness to eyes (started yesterday).  Home interventions: Theraflu, Advil 

## 2024-02-03 NOTE — Discharge Instructions (Addendum)
 Rest,push fluids, take doxycyline as prescribed Take phenergan for cough, drowsiness precautions If you have new or worsening issues go to Er for further evaluation

## 2024-02-03 NOTE — ED Provider Notes (Signed)
 " [facility]    CSN: [id number] Arrival date & time: 02/03/24  1639      History   Chief Complaint Chief Complaint  Patient presents with   Cough    I also have a sore throat along with what I think is pink eye - Entered by patient   Eye Drainage   Sore Throat    HPI Terry Camacho is a 48 y.o. female.   48 year old female, Terry Camacho, presents to urgent care for evaluation of cough sore throat eye drainage for 8 days.  Patient reports family numbers with similar symptoms, patient is taken TheraFlu and Advil  for symptom management.  The history is provided by the patient. No language interpreter was used.  Cough Associated symptoms: eye discharge and sore throat   Associated symptoms: no fever   Sore Throat    Past Medical History:  Diagnosis Date   Chronic left shoulder pain    Class 1 obesity due to excess calories without serious comorbidity with body mass index (BMI) of 31.0 to 31.9 in adult    GERD (gastroesophageal reflux disease)    does not take meds for it   History of hiatal hernia    MDD (major depressive disorder), recurrent, in full remission    Palpitations 11/18/2014    Patient Active Problem List   Diagnosis Date Noted   Low HDL (under 40) 05/08/2023   Chronic left shoulder pain 12/26/2022   Acute intractable headache 03/15/2022   Chronic neck pain 03/15/2022   Trapezius strain 12/13/2021   MDD (major depressive disorder), recurrent, in full remission 04/02/2019   Family history of thyroid  disease 03/19/2018   Class 1 obesity without serious comorbidity with body mass index (BMI) of 31.0 to 31.9 in adult 05/07/2016   History of palpitations 11/18/2014   Acute respiratory infection 02/17/2009   Acute cough 02/17/2009   GERD 04/28/2007    Past Surgical History:  Procedure Laterality Date   BICEPT TENODESIS Left 08/22/2023   Procedure: TENODESIS, BICEPS;  Surgeon: Tobie Priest, MD;  Location: [facility];  Service:  Orthopedics;  Laterality: Left;  Revision of a left rotator cuff repair with possible allograft augmentation, possible biceps tenodesis   ESOPHAGOGASTRODUODENOSCOPY  10/14/2005   esophagitis   L shoulder rotator cuff repair Left 09/25/2022   SHOULDER ARTHROSCOPY WITH OPEN ROTATOR CUFF REPAIR Left 08/22/2023   Procedure: ARTHROSCOPY, SHOULDER WITH REPAIR, ROTATOR CUFF, OPEN;  Surgeon: Tobie Priest, MD;  Location: [facility];  Service: Orthopedics;  Laterality: Left;  Revision of a left rotator cuff repair with possible allograft augmentation, possible biceps tenodesis   SUBACROMIAL DECOMPRESSION Left 08/22/2023   Procedure: DECOMPRESSION, SUBACROMIAL SPACE;  Surgeon: Tobie Priest, MD;  Location: [facility];  Service: Orthopedics;  Laterality: Left;  Revision of a left rotator cuff repair with possible allograft augmentation, possible biceps tenodesis    OB History   No obstetric history on file.      Home Medications    Prior to Admission medications  Medication Sig Start Date End Date Taking? Authorizing Provider  acetaminophen  (TYLENOL ) 500 MG tablet Take 2 tablets (1,000 mg total) by mouth every 8 (eight) hours. 08/22/23 08/21/24  Tobie Priest, MD  doxycycline  (VIBRAMYCIN ) 100 MG capsule Take 1 capsule (100 mg total) by mouth 2 (two) times daily for 7 days. 02/03/24 02/10/24  Zeya Balles, NP  fluticasone  (FLONASE ) 50 MCG/ACT nasal spray Place 2 sprays into both nostrils daily. 04/10/23   Avelina Greig BRAVO, MD  levonorgestrel (MIRENA) 20 MCG/24HR  IUD 1 each by Intrauterine route once.    [provider]  Multiple Vitamin (MULTIVITAMIN WITH MINERALS) TABS tablet Take 1 tablet by mouth daily.    [provider]  Multiple Vitamins-Minerals (HAIR SKIN AND NAILS FORMULA PO) Take by mouth.    [provider]  ondansetron  (ZOFRAN -ODT) 4 MG disintegrating tablet Take 1 tablet (4 mg total) by mouth every 8 (eight) hours as needed for nausea or vomiting. 08/22/23   Tobie Priest, MD   oxyCODONE  (ROXICODONE ) 5 MG immediate release tablet Take 1-2 tablets (5-10 mg total) by mouth every 4 (four) hours as needed (pain). 08/22/23 08/21/24  Tobie Priest, MD  promethazine-dextromethorphan (PROMETHAZINE-DM) 6.25-15 MG/5ML syrup Take 5 mLs by mouth 4 (four) times daily as needed for cough. 02/03/24   Jahmeir Geisen, Rilla, NP    Family History Family History  Problem Relation Age of Onset   Hypertension Mother 33   Other Father 37       healty   Liver cancer Maternal Uncle    Stomach cancer Maternal Aunt    Heart disease Brother        valve issue   Colon cancer Neg Hx     Social History Social History[1]   Allergies   Amoxicillin   Review of Systems Review of Systems  Constitutional:  Negative for fever.  HENT:  Positive for congestion and sore throat.   Eyes:  Positive for discharge and redness.  Respiratory:  Positive for cough.   All other systems reviewed and are negative.    Physical Exam Triage Vital Signs ED Triage Vitals  Encounter Vitals Group     BP 02/03/24 1709 118/81     Girls Systolic BP Percentile --      Girls Diastolic BP Percentile --      Boys Systolic BP Percentile --      Boys Diastolic BP Percentile --      Pulse Rate 02/03/24 1709 100     Resp 02/03/24 1709 16     Temp 02/03/24 1709 98.9 F (37.2 C)     Temp Source 02/03/24 1709 Oral     SpO2 02/03/24 1709 97 %     Weight --      Height --      Head Circumference --      Peak Flow --      Pain Score 02/03/24 1708 6     Pain Loc --      Pain Education --      Exclude from Growth Chart --    No data found.  Updated Vital Signs BP 118/81 (BP Location: Right Arm)   Pulse 100   Temp 98.9 F (37.2 C) (Oral)   Resp 16   SpO2 97%   Visual Acuity Right Eye Distance:   Left Eye Distance:   Bilateral Distance:    Right Eye Near:   Left Eye Near:    Bilateral Near:     Physical Exam Vitals and nursing note reviewed.  Constitutional:      General: She is not in acute  distress.    Appearance: She is well-developed and well-groomed.  HENT:     Head: Normocephalic.     Right Ear: Tympanic membrane is retracted.     Left Ear: Tympanic membrane is retracted.     Nose: Congestion present.     Mouth/Throat:     Lips: Pink.     Mouth: Mucous membranes are moist.     Pharynx: Oropharynx is  clear.  Eyes:     General: Lids are normal.     Extraocular Movements: Extraocular movements intact.     Conjunctiva/sclera:     Right eye: Right conjunctiva is injected. No exudate.    Left eye: Left conjunctiva is injected. No exudate.    Pupils: Pupils are equal, round, and reactive to light.  Neck:     Trachea: No tracheal deviation.  Cardiovascular:     Rate and Rhythm: Normal rate and regular rhythm.     Heart sounds: Normal heart sounds. No murmur heard. Pulmonary:     Effort: Pulmonary effort is normal.     Breath sounds: Normal breath sounds and air entry.  Abdominal:     General: Bowel sounds are normal.     Palpations: Abdomen is soft.     Tenderness: There is no abdominal tenderness.  Musculoskeletal:        General: Normal range of motion.     Cervical back: Normal range of motion.  Lymphadenopathy:     Cervical: No cervical adenopathy.  Skin:    General: Skin is warm and dry.     Findings: No rash.  Neurological:     General: No focal deficit present.     Mental Status: She is alert and oriented to person, place, and time.     GCS: GCS eye subscore is 4. GCS verbal subscore is 5. GCS motor subscore is 6.  Psychiatric:        Speech: Speech normal.        Behavior: Behavior normal. Behavior is cooperative.      UC Treatments / Results  Labs (all labs ordered are listed, but only abnormal results are displayed) Labs Reviewed - No data to display  EKG   Radiology No results found.  Procedures Procedures (including critical care time)  Medications Ordered in UC Medications - No data to display  Initial Impression / Assessment  and Plan / UC Course  I have reviewed the triage vital signs and the nursing notes.  Pertinent labs & imaging results that were available during my care of the patient were reviewed by me and considered in my medical decision making (see chart for details).     Discussed exam findings and plan of care with patient : rest,push fluids, take doxycyline as prescribed Take phenergan for cough, drowsiness precautions If you have new or worsening issues go to Er for further evaluation.  Patient verbalized understanding to this provider.  Ddx: Acute respiratory infection, acute cough, viral illness, allergies Final Clinical Impressions(s) / UC Diagnoses   Final diagnoses:  Acute respiratory infection  Acute cough     Discharge Instructions      Rest,push fluids, take doxycyline as prescribed Take phenergan for cough, drowsiness precautions If you have new or worsening issues go to Er for further evaluation    ED Prescriptions     Medication Sig Dispense Auth. Provider   promethazine-dextromethorphan (PROMETHAZINE-DM) 6.25-15 MG/5ML syrup  (Status: Discontinued) Take 5 mLs by mouth 4 (four) times daily as needed for cough. 118 mL Kayl Stogdill, NP   doxycycline  (VIBRAMYCIN ) 100 MG capsule  (Status: Discontinued) Take 1 capsule (100 mg total) by mouth 2 (two) times daily for 7 days. 14 capsule Duvall Comes, NP   promethazine-dextromethorphan (PROMETHAZINE-DM) 6.25-15 MG/5ML syrup Take 5 mLs by mouth 4 (four) times daily as needed for cough. 118 mL Kabao Leite, NP   doxycycline  (VIBRAMYCIN ) 100 MG capsule Take 1 capsule (100 mg total) by mouth  2 (two) times daily for 7 days. 14 capsule Jesyca Weisenburger, NP      PDMP not reviewed this encounter.     [1]  Social History Tobacco Use   Smoking status: Never   Smokeless tobacco: Never  Vaping Use   Vaping status: Never Used  Substance Use Topics   Alcohol use: Yes    Comment: Rarely   Drug use: No      Diann Bangerter, Rilla, NP 02/03/24 2251  "

## 2024-02-06 ENCOUNTER — Ambulatory Visit: Admitting: Family Medicine

## 2024-02-25 ENCOUNTER — Other Ambulatory Visit: Payer: Self-pay

## 2024-03-09 ENCOUNTER — Other Ambulatory Visit: Payer: Self-pay

## 2024-03-09 MED ORDER — GABAPENTIN 100 MG PO CAPS
100.0000 mg | ORAL_CAPSULE | Freq: Two times a day (BID) | ORAL | 1 refills | Status: AC | PRN
  Filled 2024-03-09: qty 30, 15d supply, fill #0

## 2024-03-09 MED ORDER — CYCLOBENZAPRINE HCL 5 MG PO TABS
5.0000 mg | ORAL_TABLET | Freq: Three times a day (TID) | ORAL | 1 refills | Status: AC | PRN
  Filled 2024-03-09: qty 30, 10d supply, fill #0

## 2024-03-10 ENCOUNTER — Other Ambulatory Visit: Payer: Self-pay

## 2024-03-11 ENCOUNTER — Other Ambulatory Visit: Payer: Self-pay

## 2024-03-11 MED ORDER — CLOTRIMAZOLE 1 % VA CREA
1.0000 | TOPICAL_CREAM | Freq: Every day | VAGINAL | 0 refills | Status: AC
  Filled 2024-03-11: qty 45, 7d supply, fill #0

## 2024-03-11 MED ORDER — FLUCONAZOLE 150 MG PO TABS
150.0000 mg | ORAL_TABLET | ORAL | 0 refills | Status: AC
  Filled 2024-03-11: qty 1, 1d supply, fill #0

## 2024-05-13 ENCOUNTER — Encounter: Admitting: Family Medicine
